# Patient Record
Sex: Female | Born: 1970
Health system: Southern US, Community
[De-identification: ages and names within clinical notes are randomized; demographics above are authoritative.]

## PROBLEM LIST (undated history)

## (undated) DIAGNOSIS — U071 COVID-19: Secondary | ICD-10-CM

## (undated) DIAGNOSIS — E559 Vitamin D deficiency, unspecified: Secondary | ICD-10-CM

## (undated) DIAGNOSIS — M1990 Unspecified osteoarthritis, unspecified site: Secondary | ICD-10-CM

## (undated) HISTORY — PX: ABDOMINAL HYSTERECTOMY: SHX81

## (undated) HISTORY — DX: Vitamin D deficiency, unspecified: E55.9

## (undated) HISTORY — DX: Unspecified osteoarthritis, unspecified site: M19.90

## (undated) HISTORY — DX: COVID-19: U07.1

## (undated) HISTORY — PX: SHOULDER SURGERY: SHX246

## (undated) HISTORY — PX: OTHER SURGICAL HISTORY: SHX169

---

## 1999-05-23 ENCOUNTER — Other Ambulatory Visit: Admission: RE | Admit: 1999-05-23 | Discharge: 1999-05-23 | Payer: Self-pay | Admitting: Obstetrics & Gynecology

## 2000-01-29 ENCOUNTER — Inpatient Hospital Stay (HOSPITAL_COMMUNITY): Admission: AD | Admit: 2000-01-29 | Discharge: 2000-01-29 | Payer: Self-pay | Admitting: Obstetrics and Gynecology

## 2000-01-29 ENCOUNTER — Inpatient Hospital Stay (HOSPITAL_COMMUNITY): Admission: AD | Admit: 2000-01-29 | Discharge: 2000-02-01 | Payer: Self-pay | Admitting: *Deleted

## 2003-09-14 ENCOUNTER — Encounter (INDEPENDENT_AMBULATORY_CARE_PROVIDER_SITE_OTHER): Payer: Self-pay

## 2003-09-14 ENCOUNTER — Ambulatory Visit (HOSPITAL_COMMUNITY): Admission: AD | Admit: 2003-09-14 | Discharge: 2003-09-14 | Payer: Self-pay | Admitting: Obstetrics and Gynecology

## 2003-09-22 ENCOUNTER — Inpatient Hospital Stay (HOSPITAL_COMMUNITY): Admission: AD | Admit: 2003-09-22 | Discharge: 2003-09-22 | Payer: Self-pay | Admitting: Obstetrics and Gynecology

## 2003-09-22 IMAGING — US US TRANSVAGINAL NON-OB
1 series · 18 of 25 positions shown · non-contrast
Comparison: none

CLINICAL DATA: D & C for blighted ovum on [DATE], [7K], who began passing clots approximately 3 days ago.  11 weeks gestational age at the time of D & C.
 TRANSVAGINAL PELVIC ULTRASOUND, [DATE]
 Transvaginal examination was performed revealing a combination of echogenic material and fluid within the endometrial canal.  There is a cystic structure which did not change within the endometrial canal which could certainly represent the gestational sac.  The canal measures maximally 10 mm in thickness.  No focal myometrial abnormalities are identified.
 Both ovaries are normal in size and appearance, the right measuring 4.1 x 1.8 x 1.9 cm and the left measuring 2.9 x 1.3 x 2.0 cm.  There are numerous small cysts in both ovaries, though no dominant corpus luteum cyst is identified.  Two of the small cysts in the right ovary are more complex in appearance.  No adnexal masses.  Moderate free fluid surrounding the uterus and right adnexa.
 IMPRESSION 
 Combination of echogenic blood clot and fluid within the endometrial canal. 
 Small bilateral ovarian cysts although no dominant cysts are identified.  Two of the small cysts in the right ovary are somewhat complex in appearance.
 No adnexal masses. Moderate free fluid surrounding the uterus and right adnexa.  Query retained gestational sac.

[Series 1: us pelvis complete · 18 of 38 slices shown]
[im 1/38]
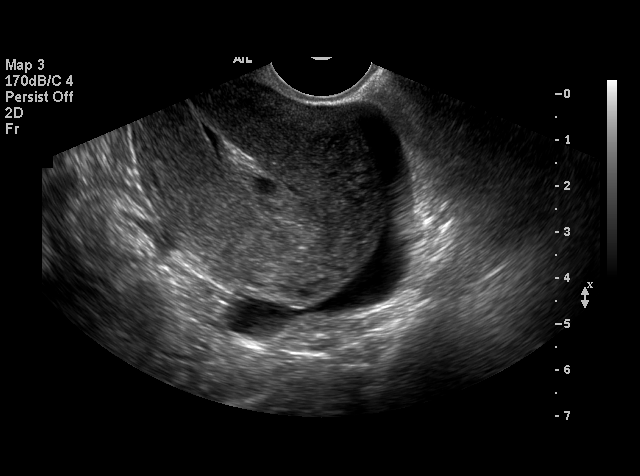
[im 4/38]
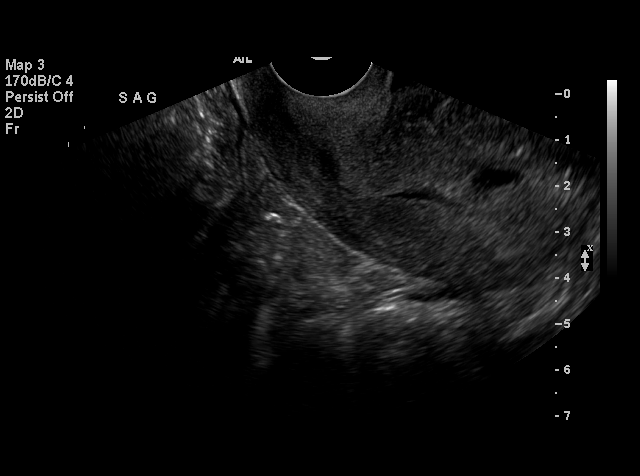
[im 5/38]
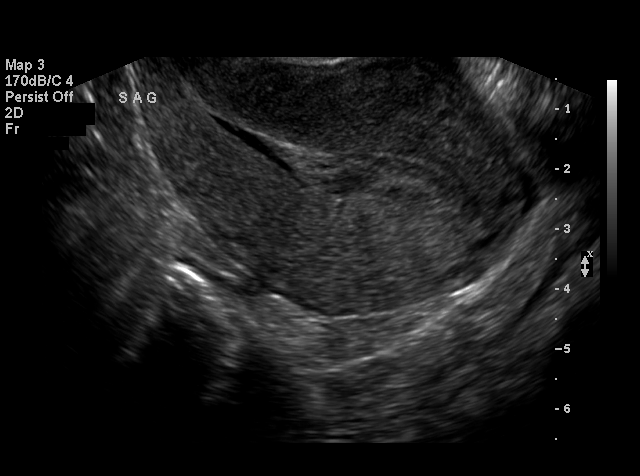
[im 7/38]
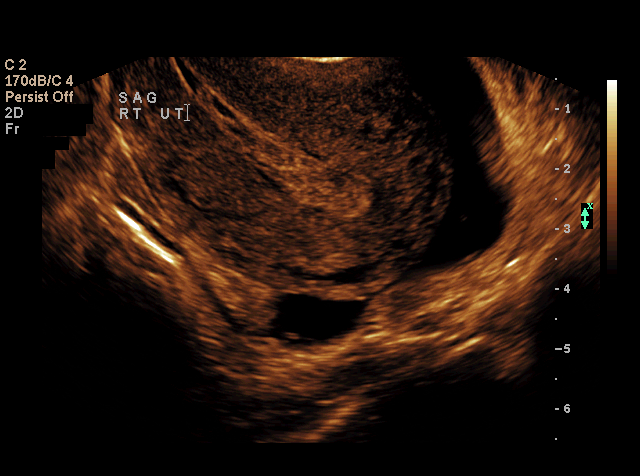
[im 10/38]
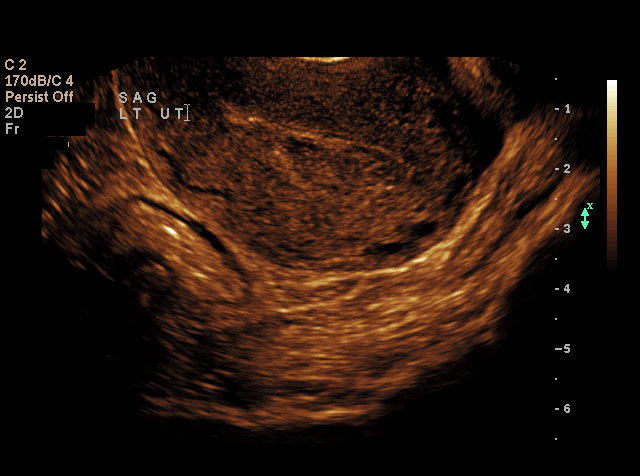
[im 11/38]
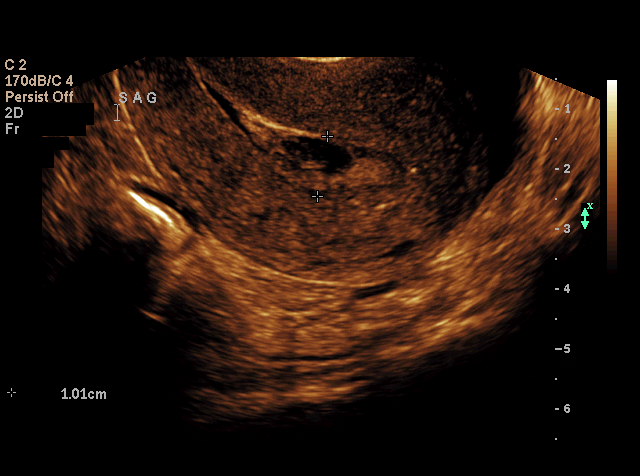
[im 14/38]
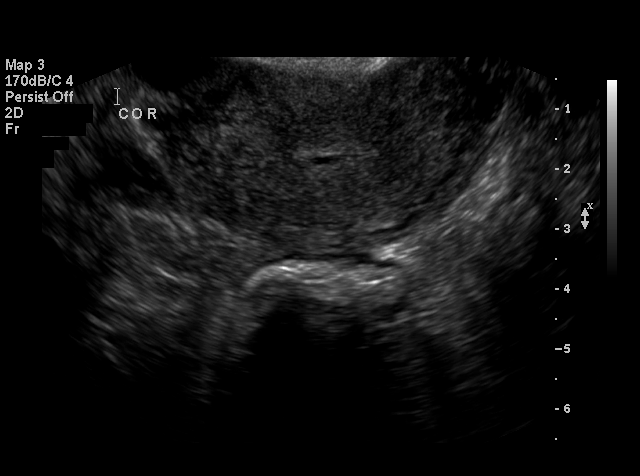
[im 16/38]
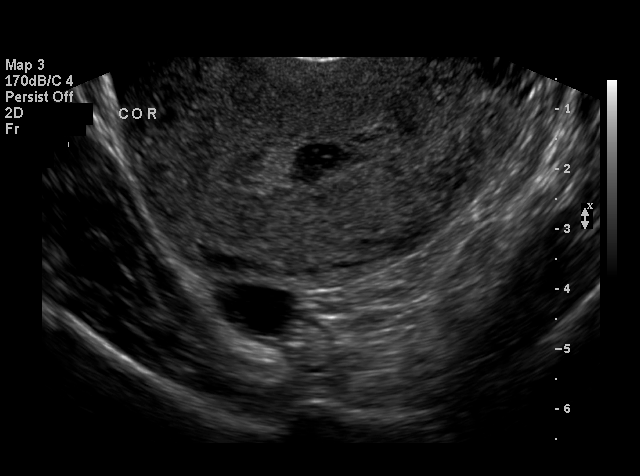
[im 17/38]
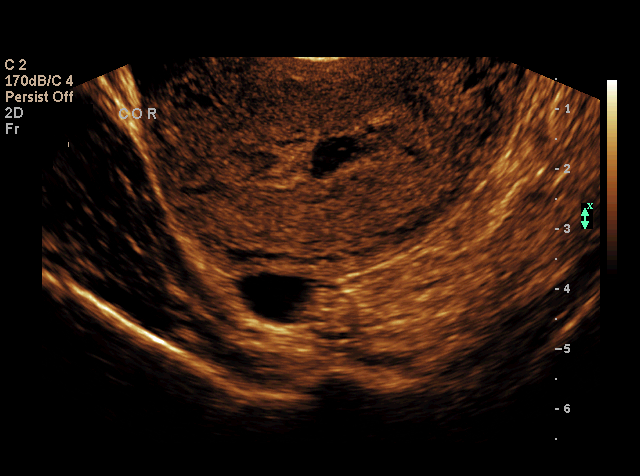
[im 21/38]
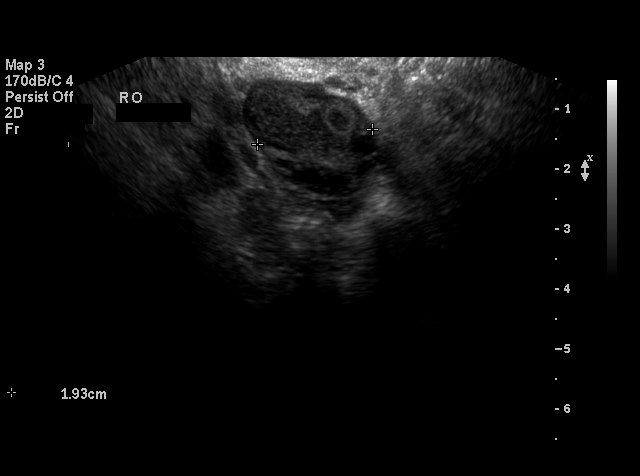
[im 22/38]
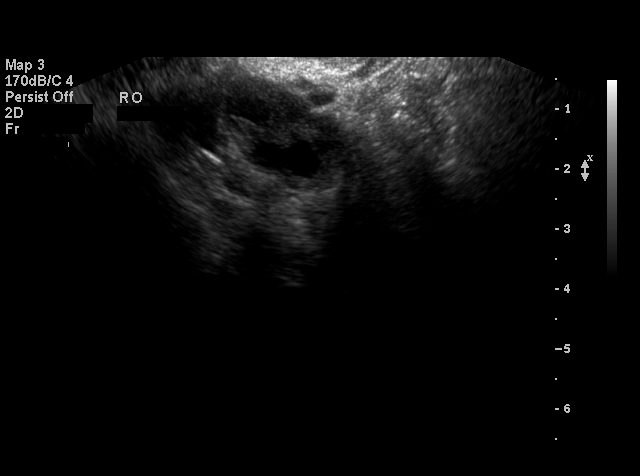
[im 24/38]
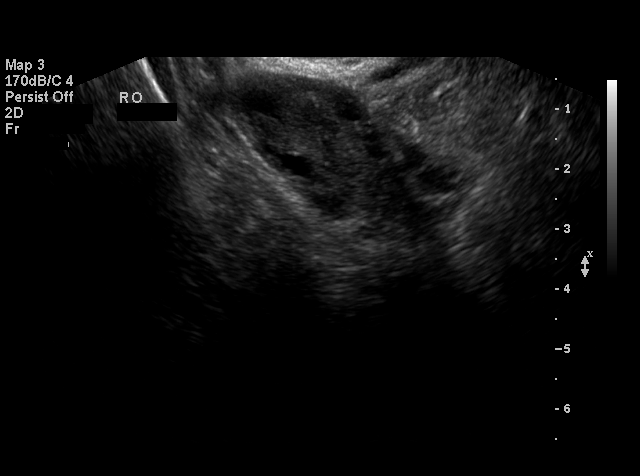
[im 27/38]
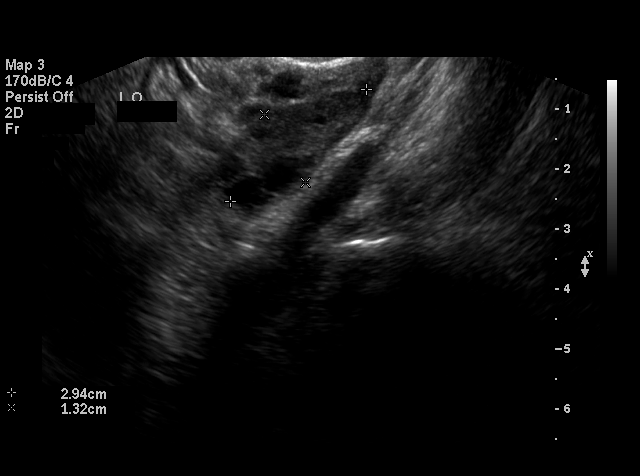
[im 28/38]
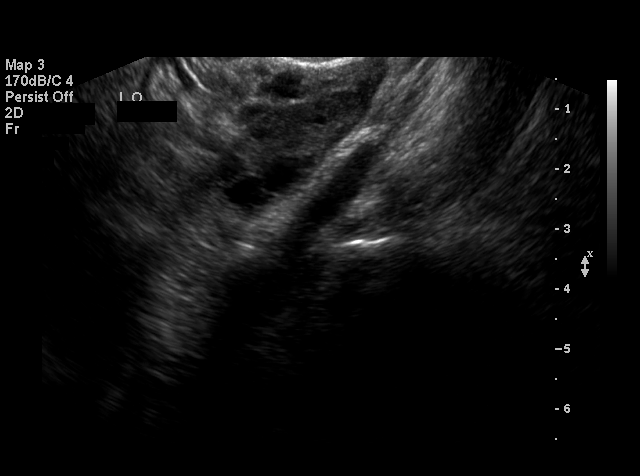
[im 31/38]
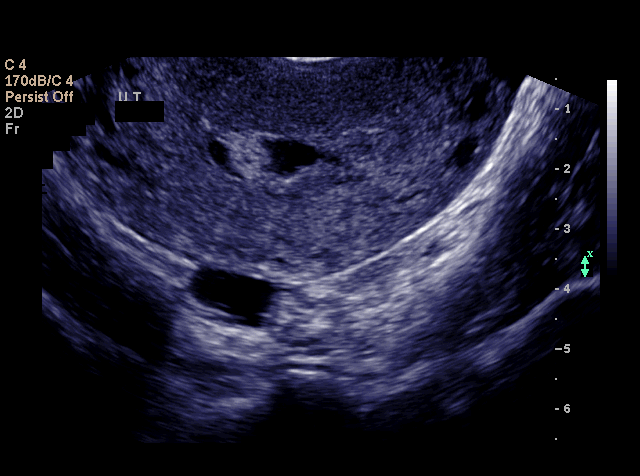
[im 33/38]
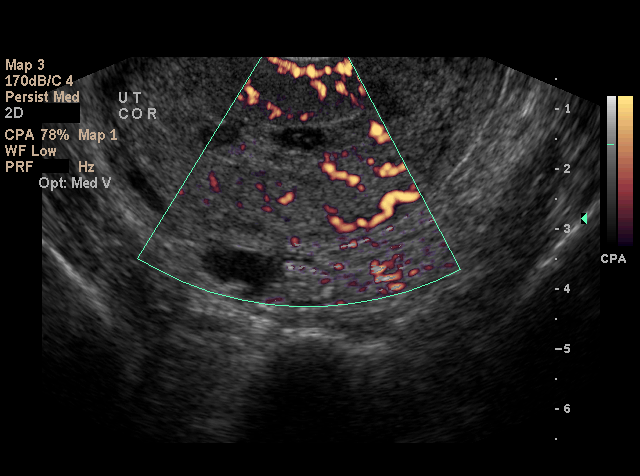
[im 34/38]
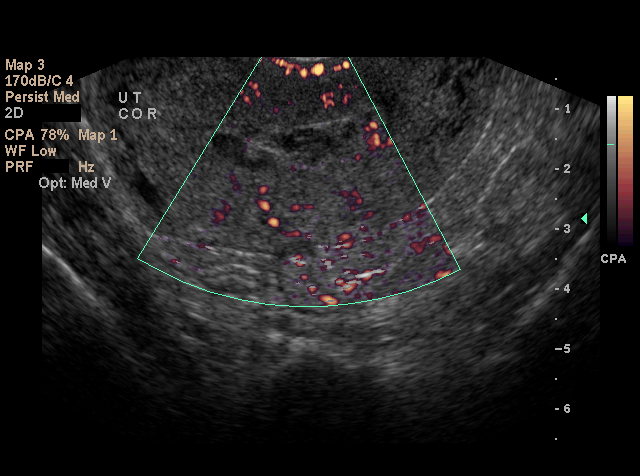
[im 38/38]
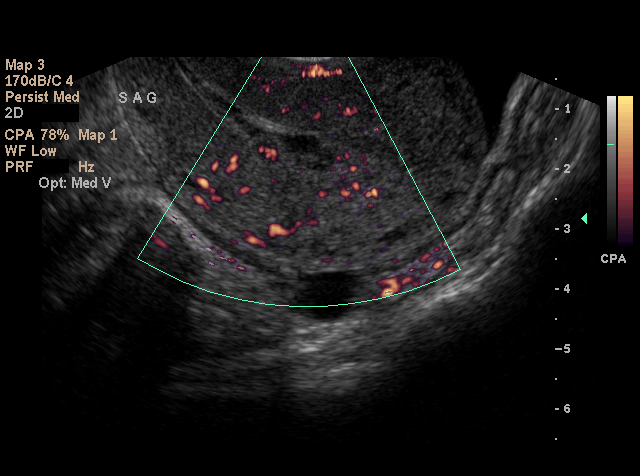

[18 of 25 positions shown; findings below may reference images not displayed]

## 2004-01-21 ENCOUNTER — Other Ambulatory Visit: Admission: RE | Admit: 2004-01-21 | Discharge: 2004-01-21 | Payer: Self-pay | Admitting: Obstetrics and Gynecology

## 2004-08-26 ENCOUNTER — Ambulatory Visit (HOSPITAL_COMMUNITY): Admission: RE | Admit: 2004-08-26 | Discharge: 2004-08-26 | Payer: Self-pay | Admitting: Obstetrics and Gynecology

## 2004-08-26 IMAGING — US US OB TRANSVAGINAL MODIFY
1 series · 14 of 28 positions shown · non-contrast
Comparison: none

CLINICAL DATA: Positive pregnancy test.   Gestational age of 5 weeks 0 days by LMP.  Right-sided pelvic pain.  Evaluate for ectopic pregnancy. 
 OBSTETRICAL ULTRASOUND WITH TRANSVAGINAL:
 A single tiny fluid collection is seen in the endometrial cavity which appears to have a double decidual sac sign.  The mean diameter is 3 mm which corresponds with a gestational age of 5 weeks 0 days, and this matches LMP.  No fibroids or other uterine abnormalities are seen.  
 A cyst with low-level internal echoes is seen in the right ovary measuring 1.8 cm.  This is consistent with a corpus luteum.  The left ovary is normal in appearance.  No adnexal masses or free fluid are identified.

[Series 1: us ob transvaginal modify · 0.27mm/px · 14 of 33 slices shown]
[im 2/33]
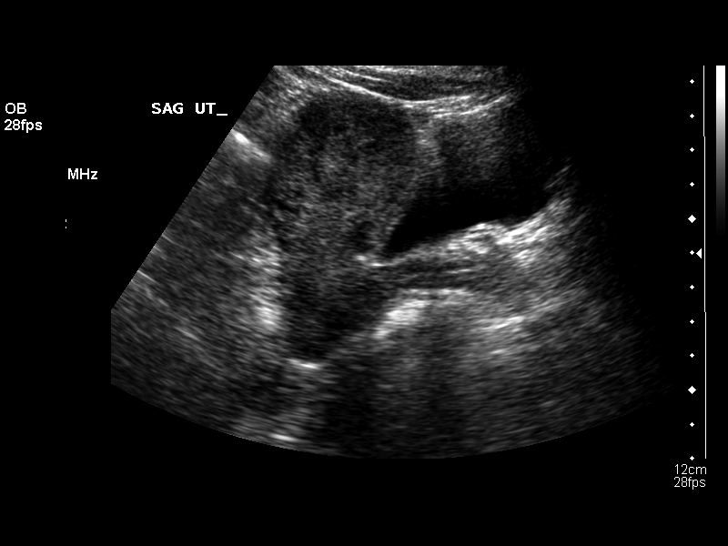
[im 4/33]
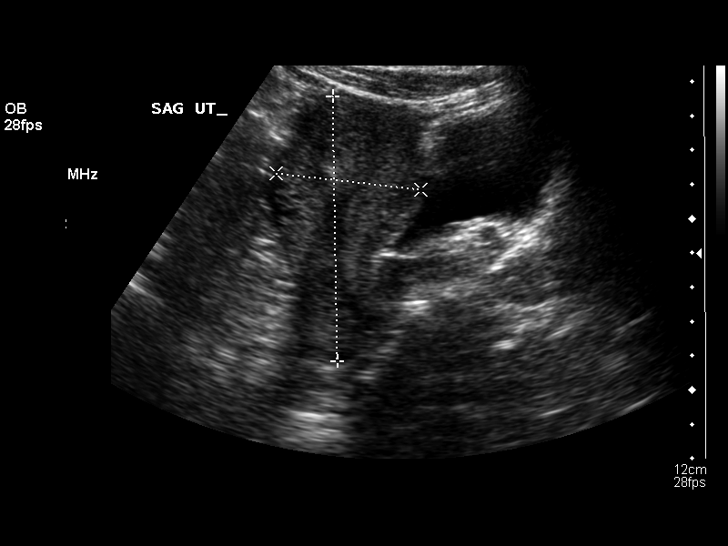
[im 6/33]
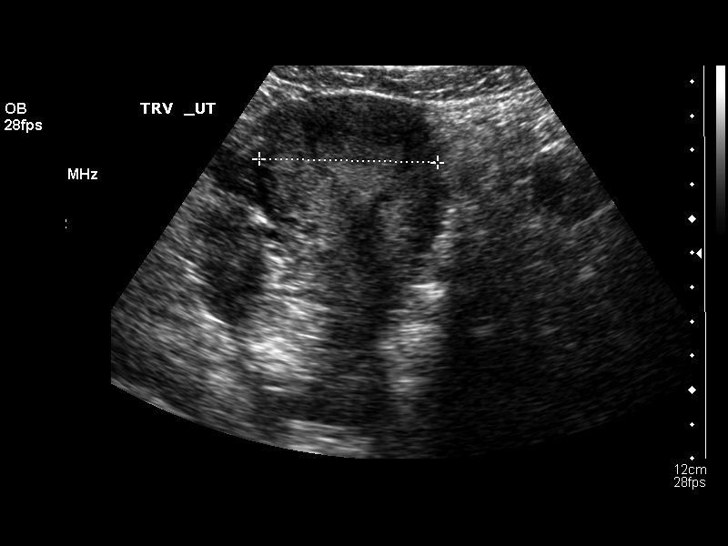
[im 9/33]
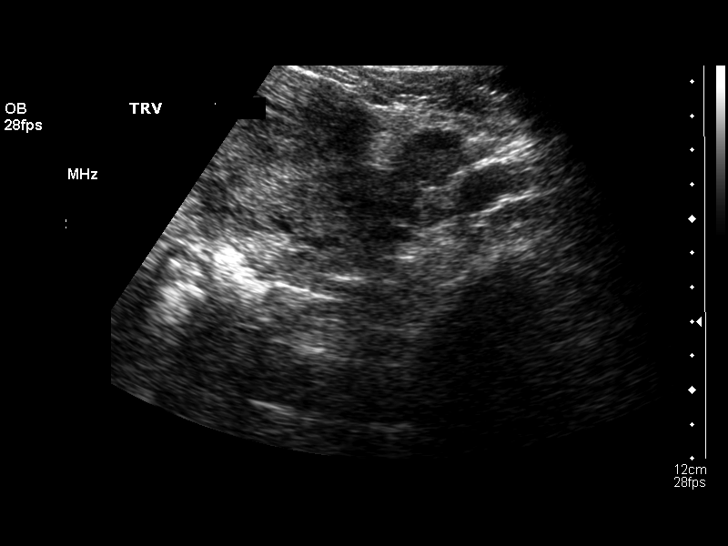
[im 11/33]
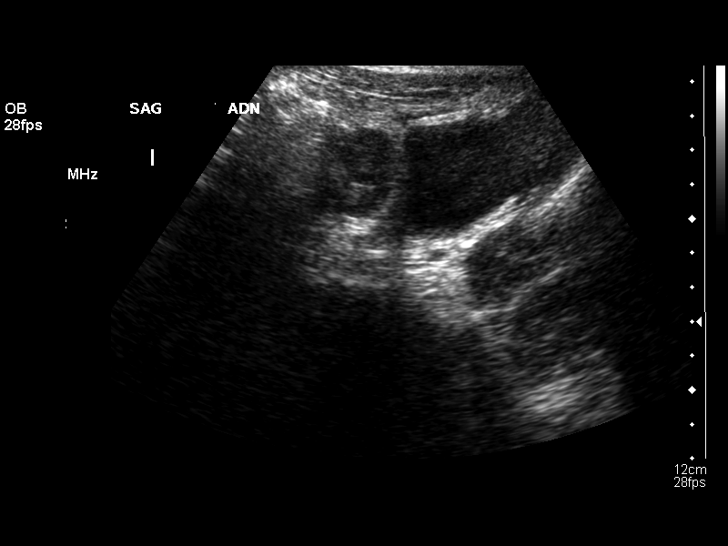
[im 14/33]
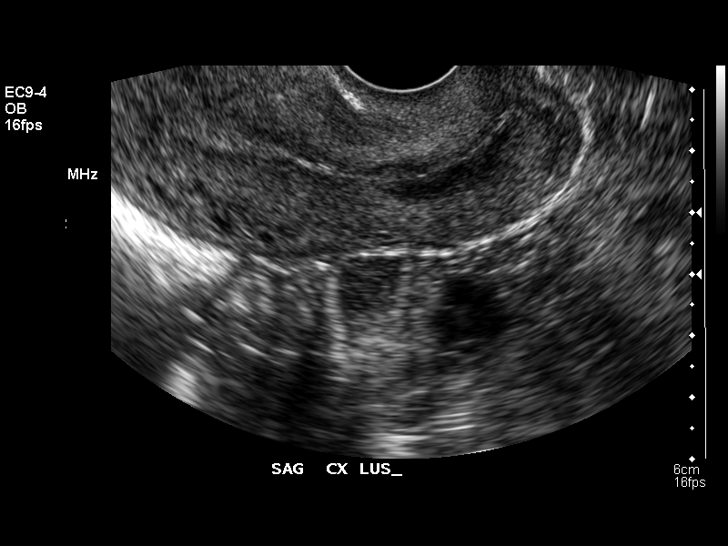
[im 16/33]
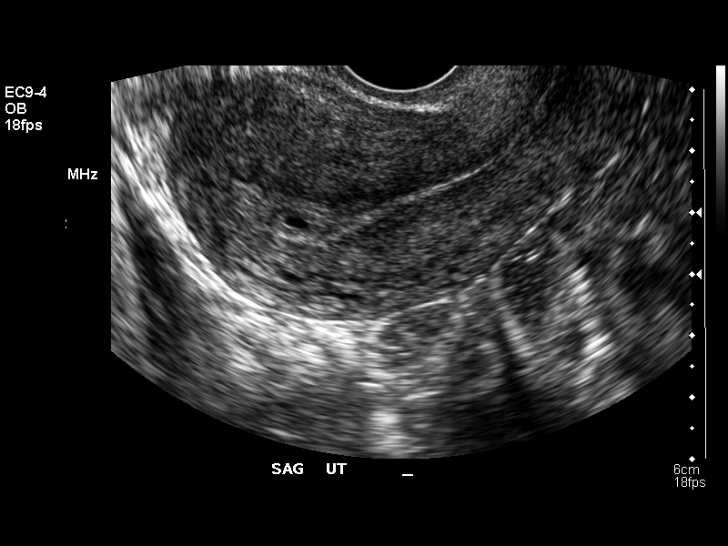
[im 18/33]
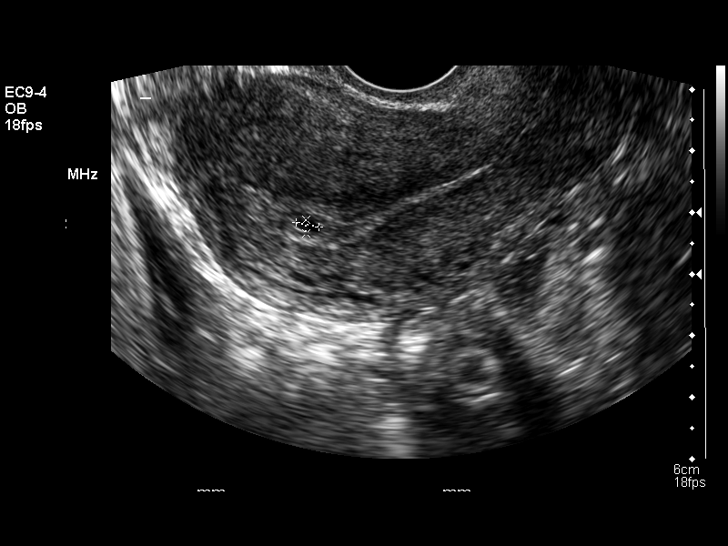
[im 21/33]
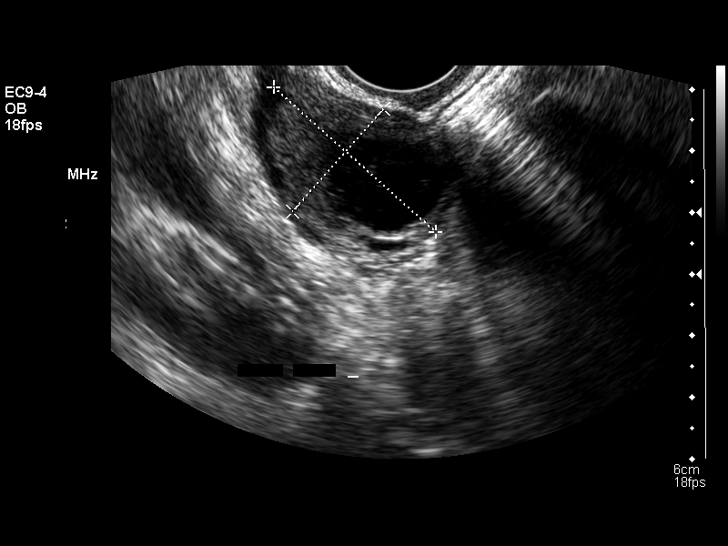
[im 23/33]
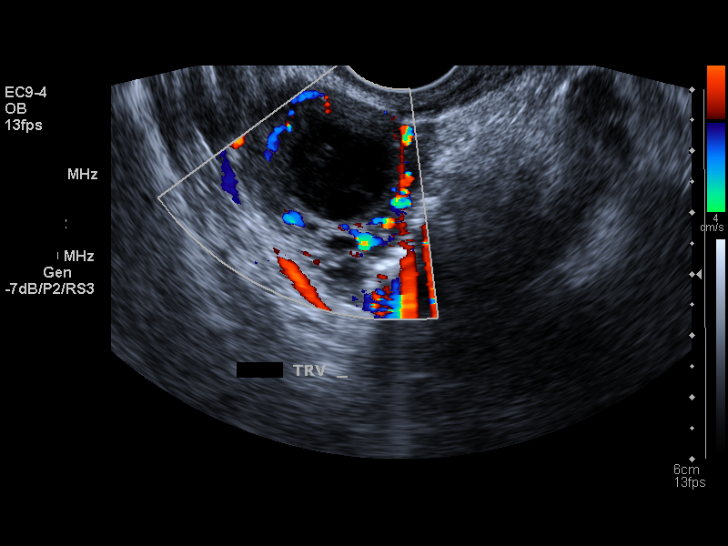
[im 25/33]
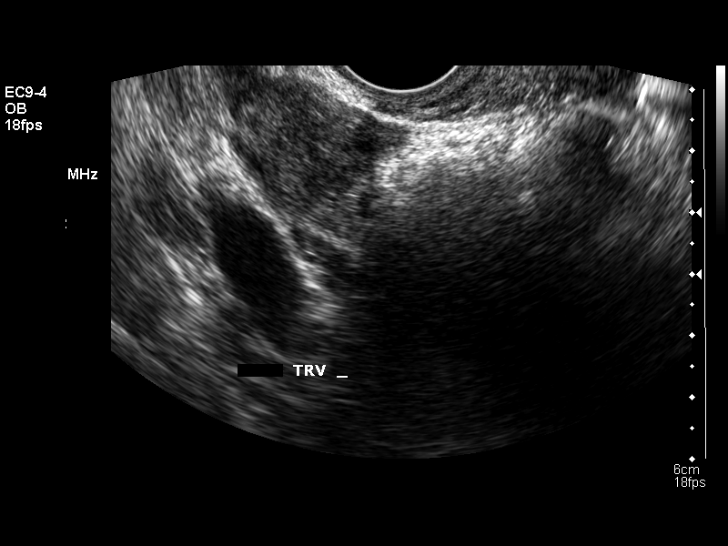
[im 28/33]
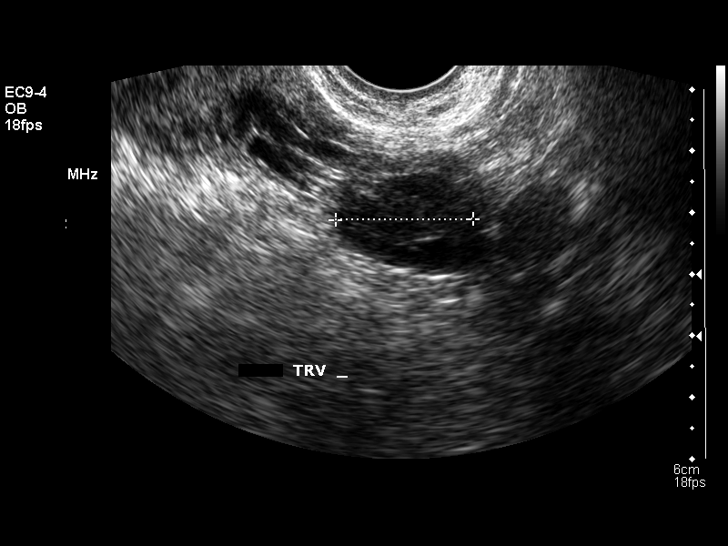
[im 30/33]
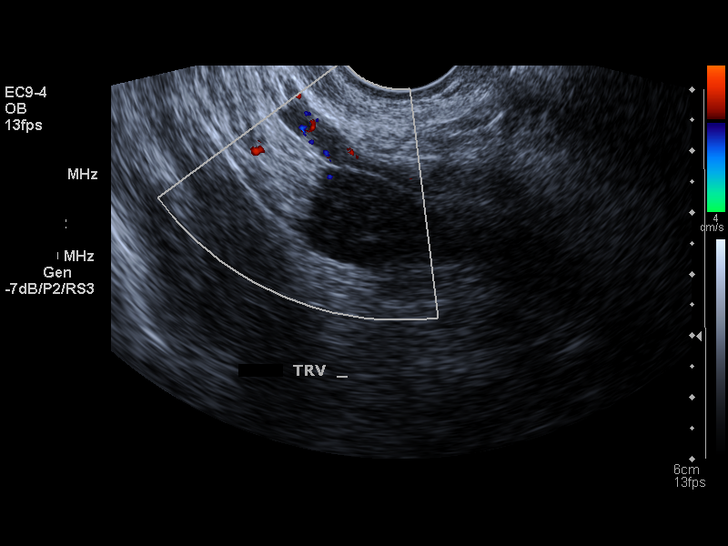
[im 33/33]
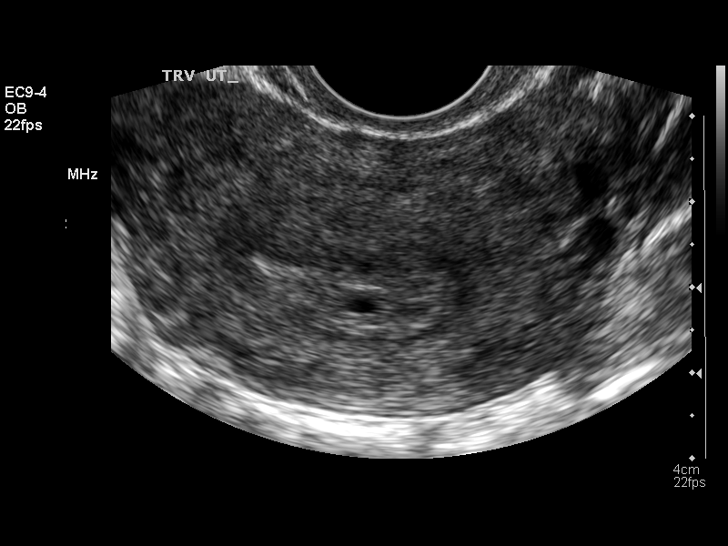

[14 of 28 positions shown; findings below may reference images not displayed]

IMPRESSION: 1.  Probable early single intrauterine gestational sac, with estimated gestational age of 5 weeks 0 days by mean sac diameter.  Follow-up of quantitative beta HCG levels or follow-up ultrasound is recommended to confirm pregnancy progression.
 2.  1.8 cm right ovarian corpus luteum cyst.  No evidence of adnexal masses or free fluid.

## 2004-09-02 ENCOUNTER — Ambulatory Visit (HOSPITAL_COMMUNITY): Admission: RE | Admit: 2004-09-02 | Discharge: 2004-09-02 | Payer: Self-pay | Admitting: Obstetrics and Gynecology

## 2004-09-02 IMAGING — US US OB TRANSVAGINAL
1 series · 14 of 28 positions shown · non-contrast
Comparison: none

CLINICAL DATA: Follow-up gestational sac seen on [DATE].  Estimated gestational age by LMP is 6 weeks 0 days.  
TRANSVAGINAL OBSTETRICAL ULTRASOUND:
An 11.8 mm  gestational sac  is seen which indicates a gestational age of 6 weeks 0 days.  There is a 1.6 mm fetal pole and normal appearing yolk sac today.  A heartbeat flicker is seen on realtime exam, but remains too small to measure an accurate heart rate.  Both ovaries have a normal size, shape and appearance.  There is no free pelvic fluid.

[Series 1: us ob transvaginal · 0.21mm/px · 44 acquisitions, 14 frames shown]
[im 2/44]
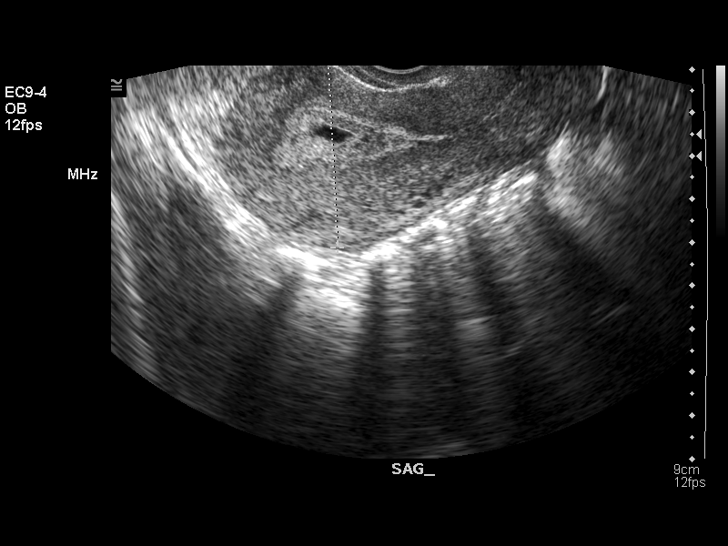
[im 5/44]
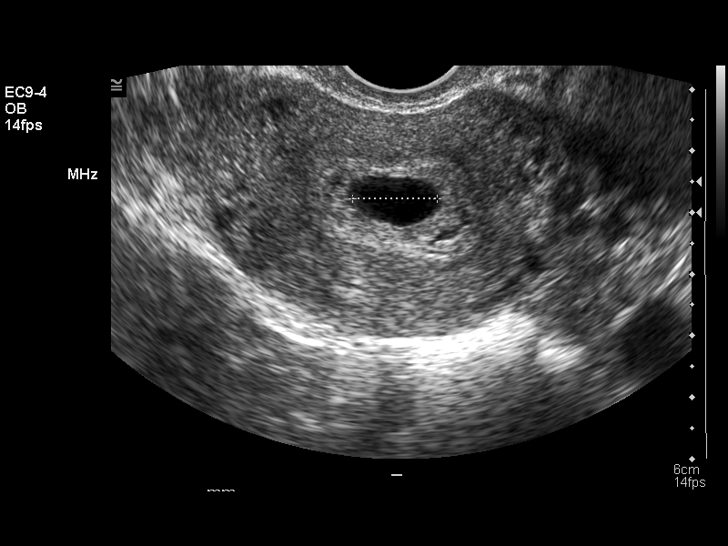
[im 8/44]
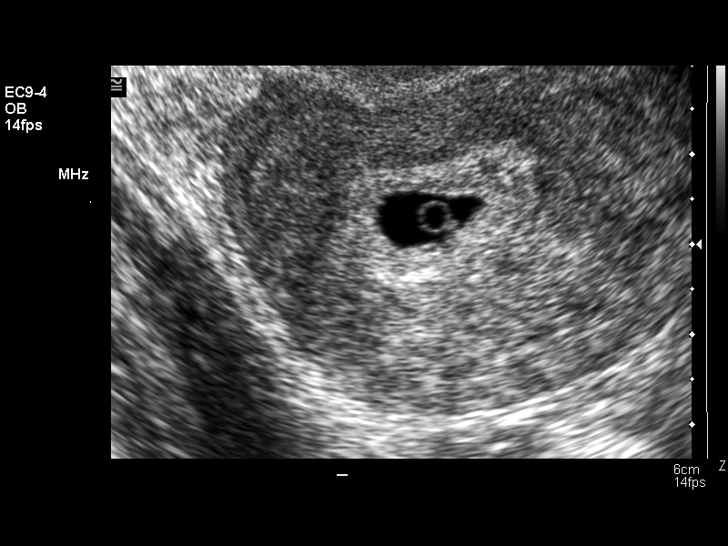
[im 12/44]
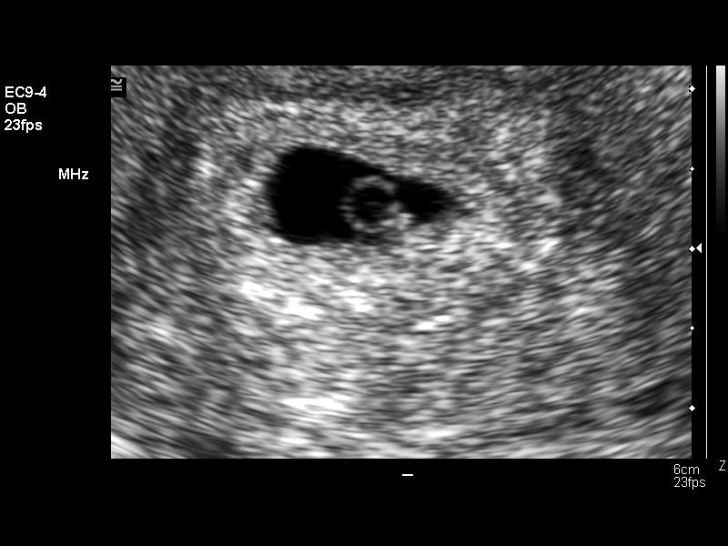
[im 15/44]
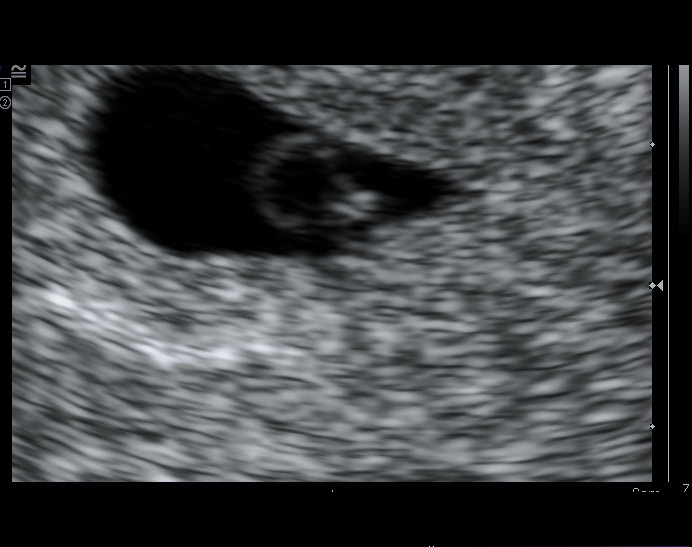
[im 18/44]
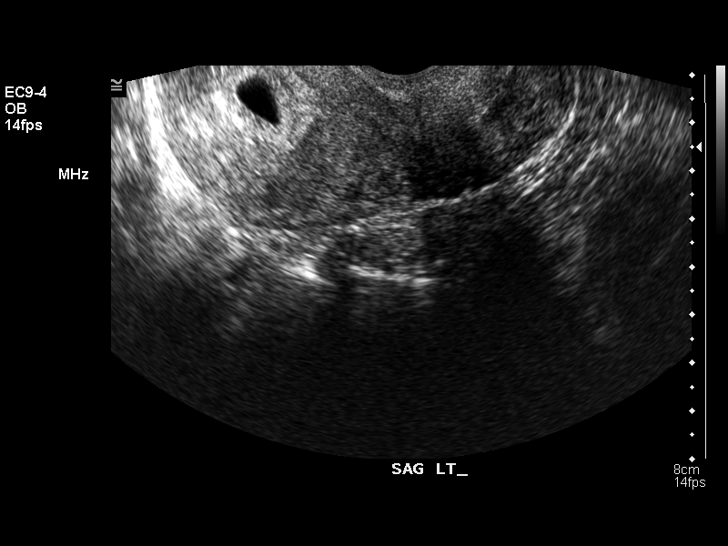
[im 21/44]
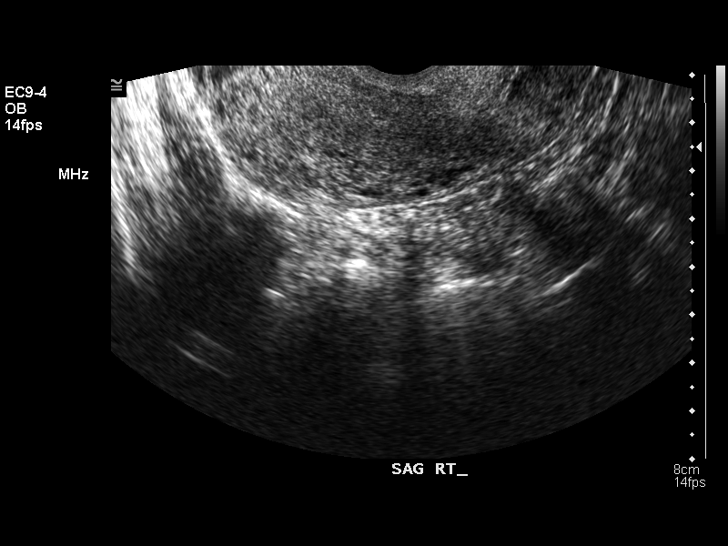
[im 24/44]
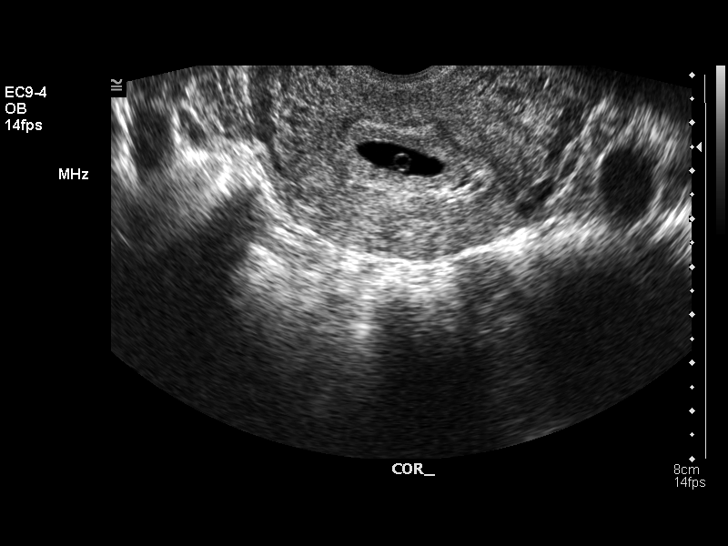
[im 28/44]
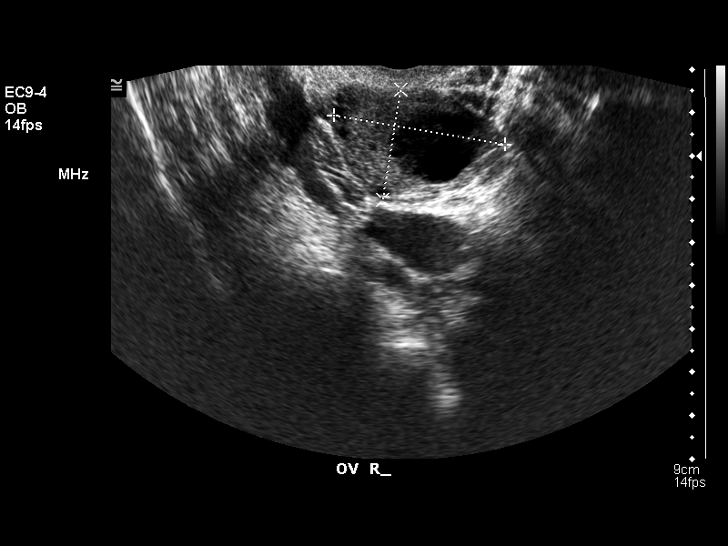
[im 31/44]
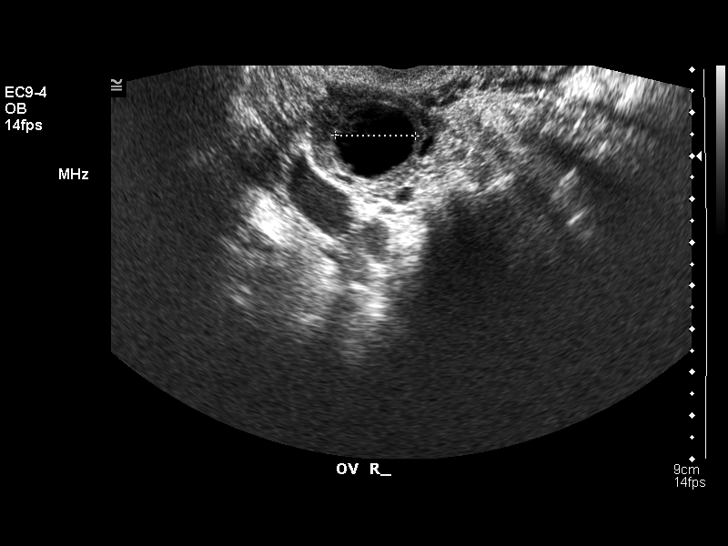
[im 34/44]
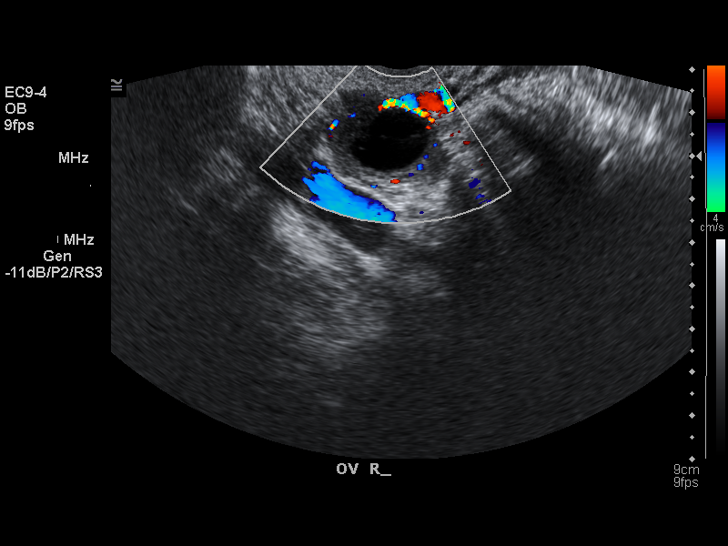
[im 37/44]
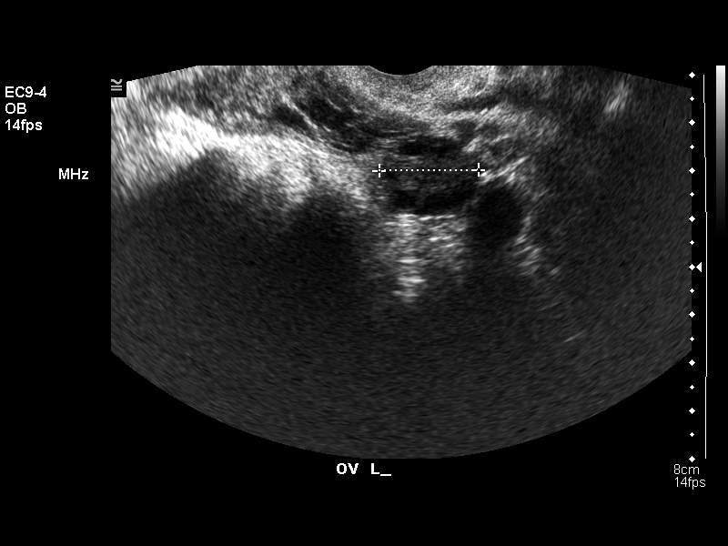
[im 40/44]
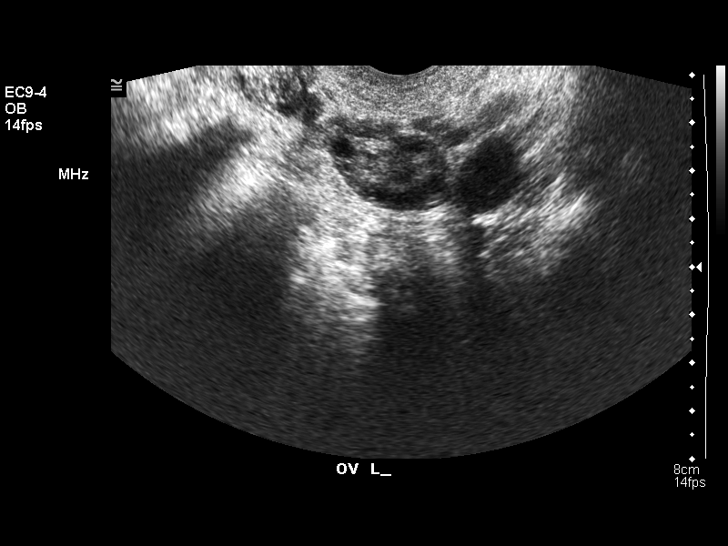
[im 44/44]
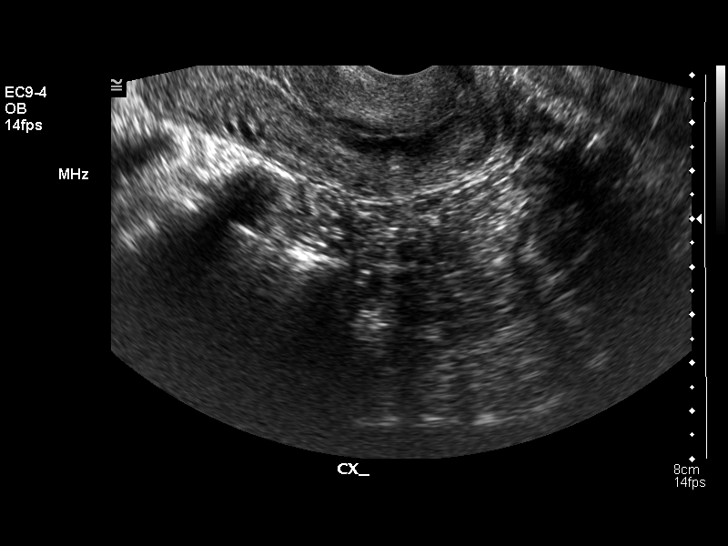

[14 of 28 positions shown; findings below may reference images not displayed]

IMPRESSION: Single intrauterine gestation with estimated gestational age of 6 weeks 0 days.

## 2004-12-01 ENCOUNTER — Ambulatory Visit (HOSPITAL_COMMUNITY): Admission: RE | Admit: 2004-12-01 | Discharge: 2004-12-01 | Payer: Self-pay | Admitting: Obstetrics and Gynecology

## 2004-12-01 IMAGING — US US OB DETAIL+14 WK
1 series · 13 of 28 positions shown · non-contrast
Comparison: none

CLINICAL DATA: Advanced maternal age.  Assess amniotic fluid and fetal anatomy.

[Series 1: us ob detail+14 wk · 0.33mm/px · 13 of 108 slices shown]
[im 4/108]
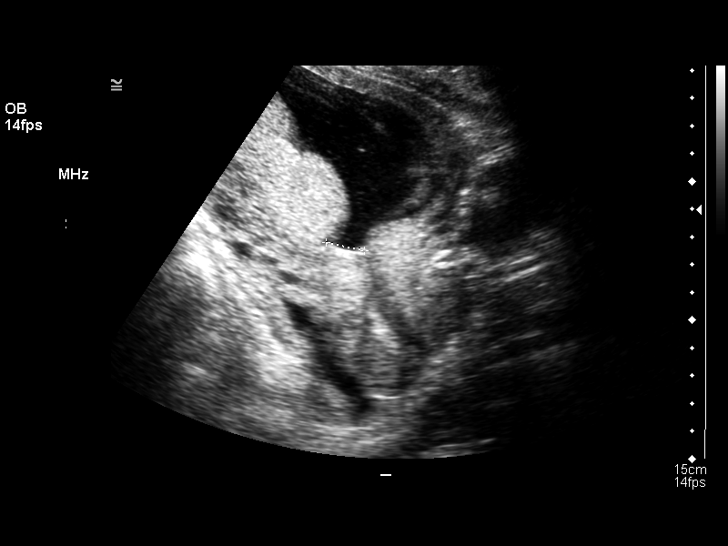
[im 12/108]
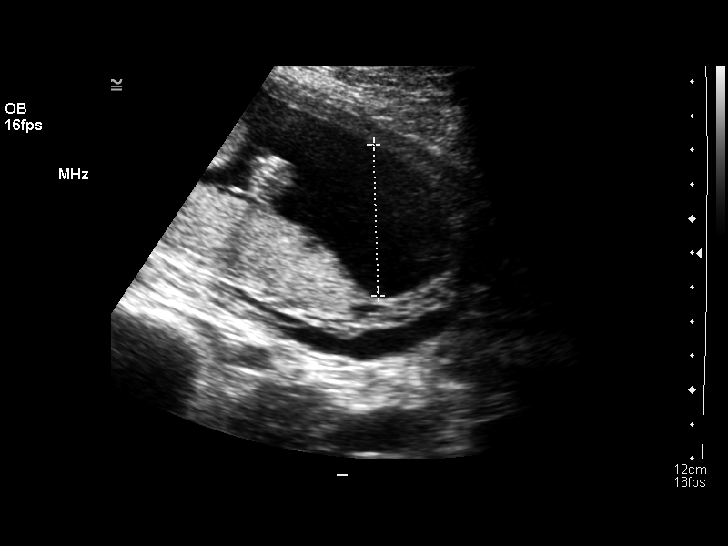
[im 20/108]
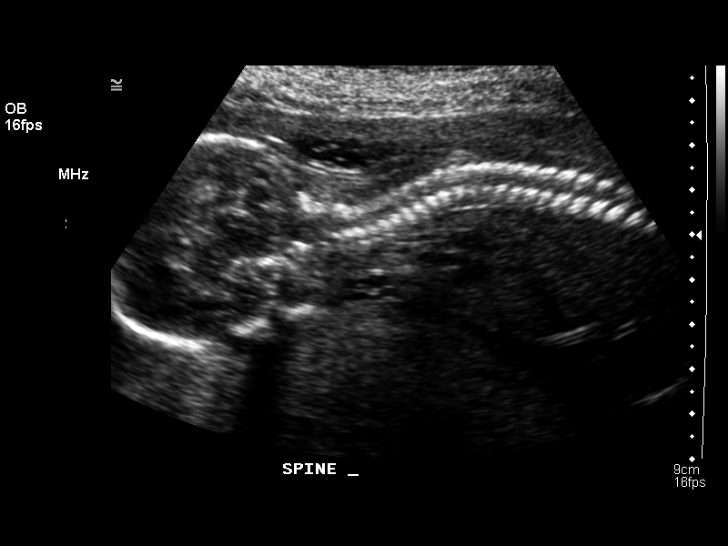
[im 28/108]
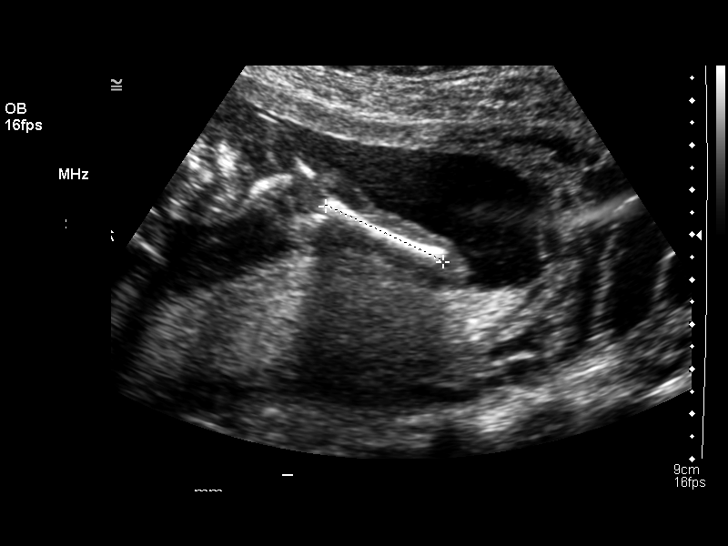
[im 36/108]
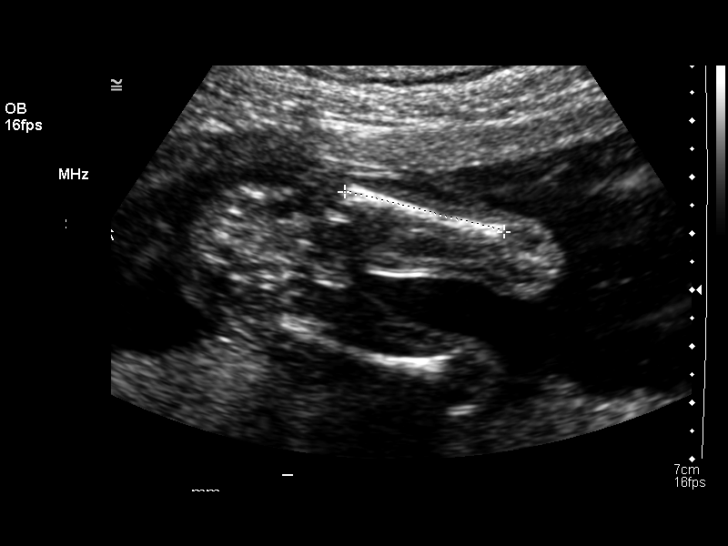
[im 44/108]
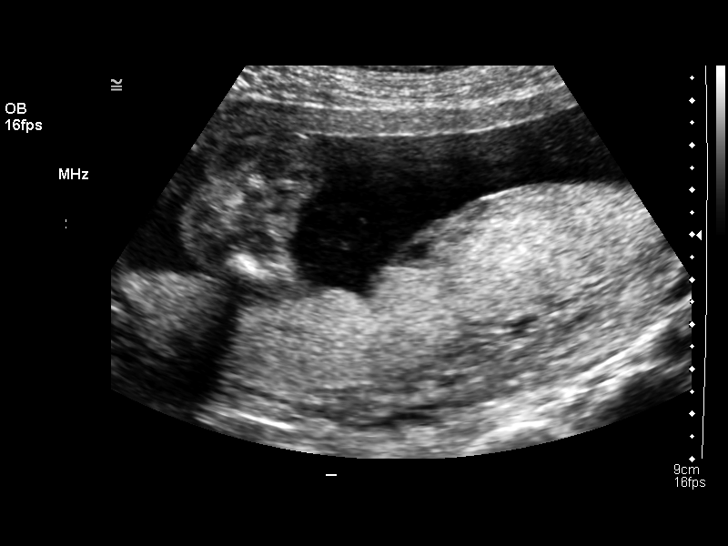
[im 56/108]
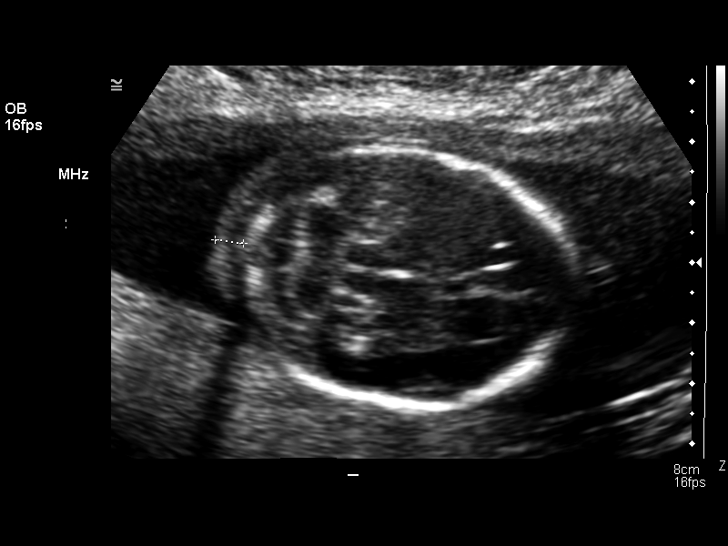
[im 64/108]
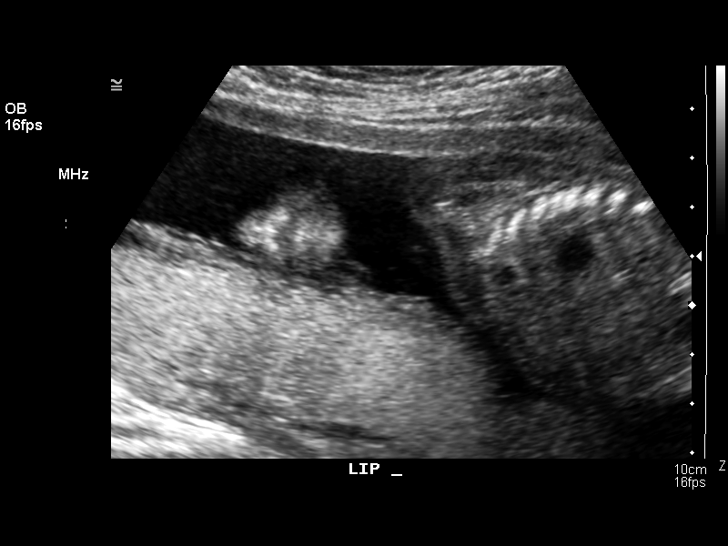
[im 72/108]
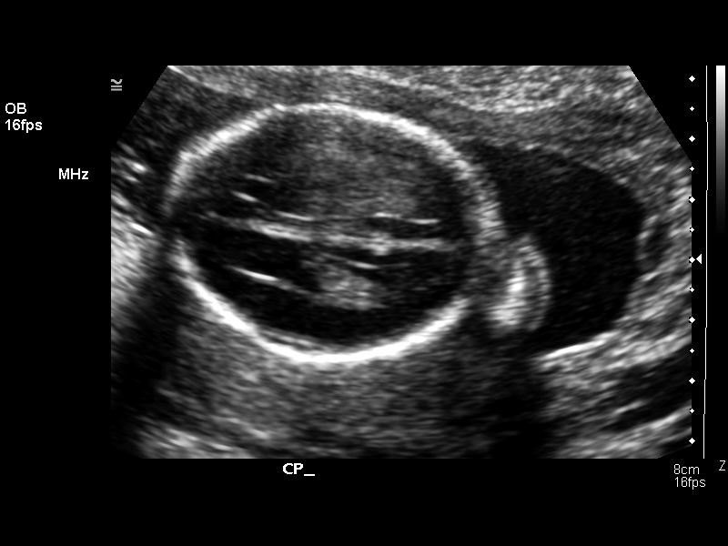
[im 80/108]
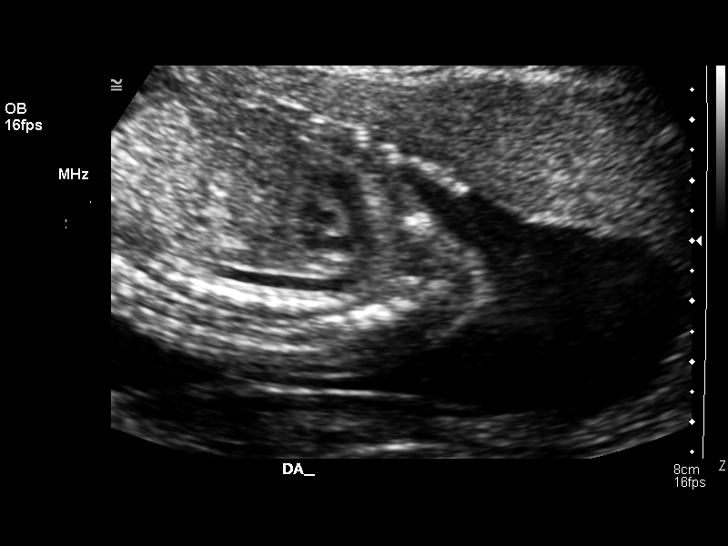
[im 88/108]
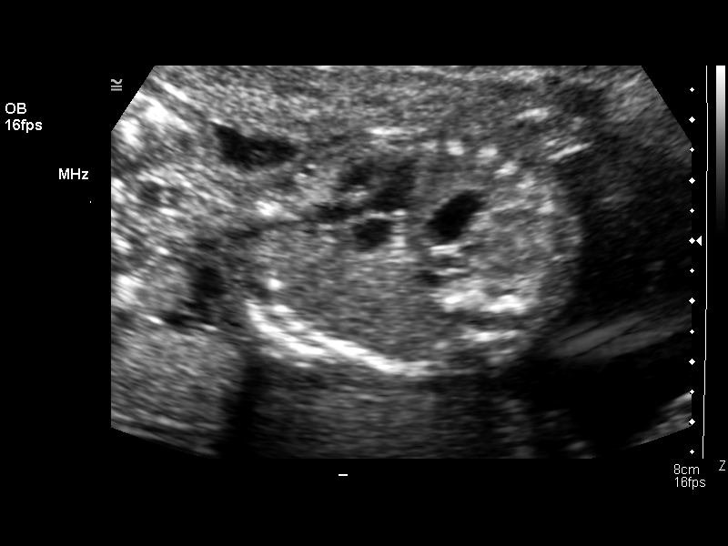
[im 96/108]
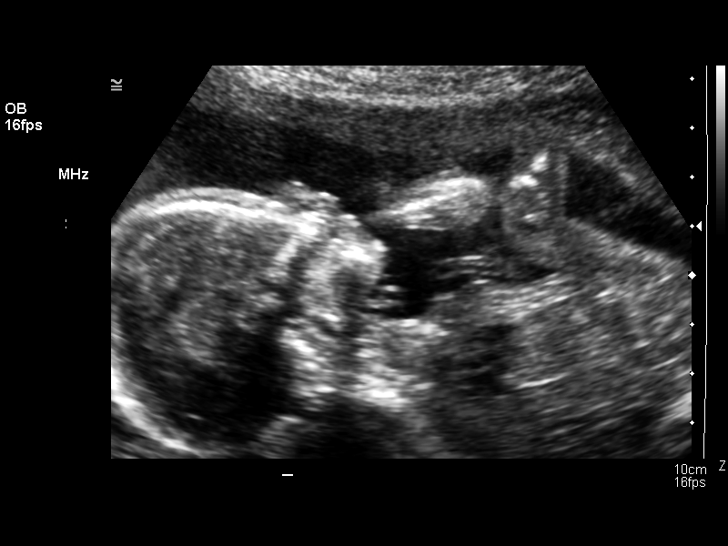
[im 104/108]
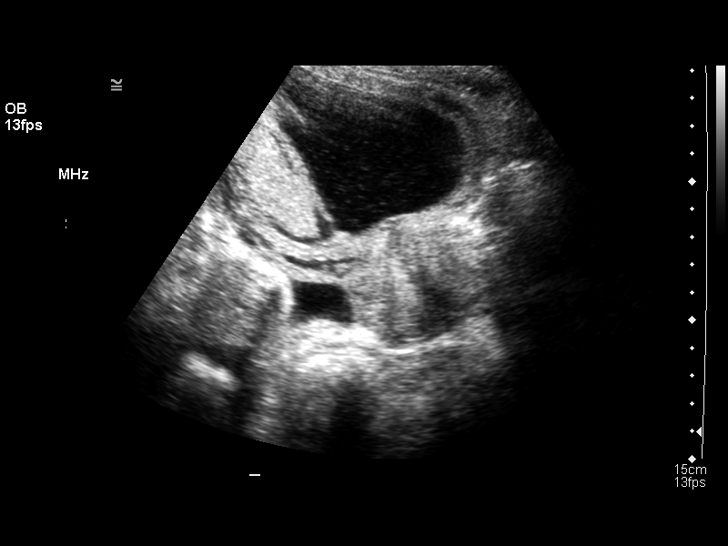

[13 of 28 positions shown; findings below may reference images not displayed]

DETAILED OBSTETRICAL ULTRASOUND:

Number of Fetuses:  1
Heart Rate:  153
Movement:  Yes
Breathing:  No
Presentation:  Breech
Placental Location:  Posterior, low-lying
Grade:  Previa:No    
Comment:  Currently the inferior margin of the placenta lies 1.7 cm from the internal cervical os compatible with a low-lying position.  
Amniotic Fluid (Subjective):  Normal
Amniotic Fluid (Objective):  4.4 cm Vertical pocket 

FETAL BIOMETRY
BPD:  4.1 cm  18 w 3 d
HC:  16.0 cm   18 w 6 d
AC:  14.4 cm  19 w 5 d
FL:  2.9 cm   19 w 0 d
HL:  2.9 cm   19 w 4 d

MEAN GA:  19 w 1 d
ASSIGNED GA:  18 w 6 d

FETAL ANATOMY
Lateral Ventricles:  Visualized 
Thalami/CSP:  Visualized 
Posterior Fossa:  Visualized 
Nuchal Region:  Visualized 
Spine:  Visualized 
4 Chamber Heart on Left:  Visualized 
Stomach on Left:  Visualized 
3 Vessel Cord:  Visualized 
Cord Insertion Site:  Visualized 
Kidneys:  Visualized 
Bladder:  Visualized 
Extremities:  Visualized 

ADDITIONAL ANATOMY VISUALIZED:  LVOT, RVOT, upper lip, orbits, profile, diaphragm, heel, 5th digit, ductal arch, aortic arch and female genitalia.
Comment:  Noted is the presence of an echogenic intracardiac focus within the left ventricle.  The nasal bone was visualized.

MATERNAL UTERINE AND ADNEXAL FINDINGS
Cervix:  4.3 cm Transabdominally
IMPRESSION: 1.  Single intrauterine pregnancy demonstrating an estimated gestational age by ultrasound of 19 weeks and 1 day.  Correlation with assigned gestational age by LMP and initial ultrasound of 18 weeks and 6 days suggests appropriate growth.
2.  Current low-lying placental position.  Follow-up at greater than 28 weeks is recommended for initial short term reassessment.  
3.  Noted is the presence of an echogenic intracardiac focus within the left cardiac ventricle.  No other focal fetal or placental abnormalities are seen.  Specifically, no other soft markers for Down syndrome are noted. As an isolated finding, the presence of an echogenic intracardiac focus would change the patient's preultrasound odds risk ratio at age 33 from [DATE] to [DATE].  The patient states she declined to have a triple screen performed.  
Today?s scan findings and their significance were discussed with the patient and her husband.  The patient was given a preliminary copy of this report to take to an office visit immediately following this exam.

</u12:p>

## 2004-12-30 ENCOUNTER — Ambulatory Visit (HOSPITAL_COMMUNITY): Admission: RE | Admit: 2004-12-30 | Discharge: 2004-12-30 | Payer: Self-pay | Admitting: Obstetrics and Gynecology

## 2004-12-30 IMAGING — US US OB FOLLOW-UP
1 series · 18 of 28 positions shown · non-contrast
Comparison: none

CLINICAL DATA: 23 week, 0 day assigned gestational age.   Follow-up placenta previa and fetal growth.   Echogenic intracardiac focus seen on prior ultrasound.  Increased risk for Down Syndrome.

[Series 1: us ob re-eval · 18 of 57 slices shown]
[im 1/57]
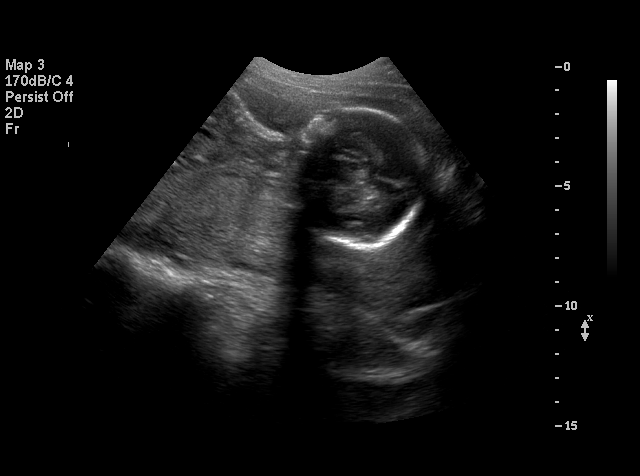
[im 5/57]
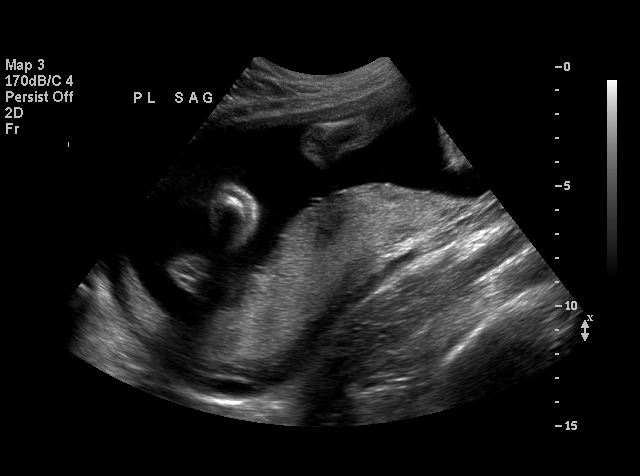
[im 7/57]
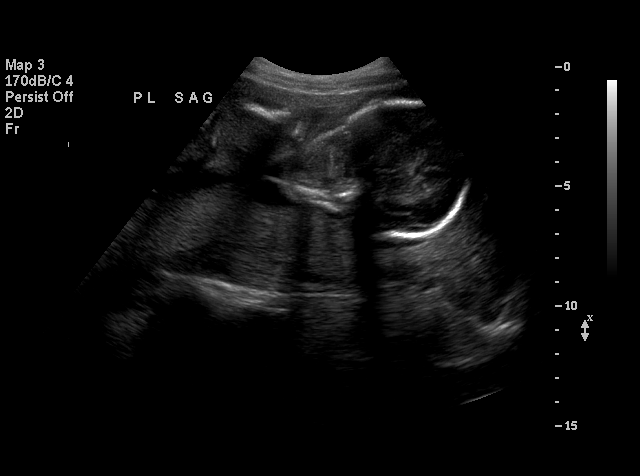
[im 11/57]
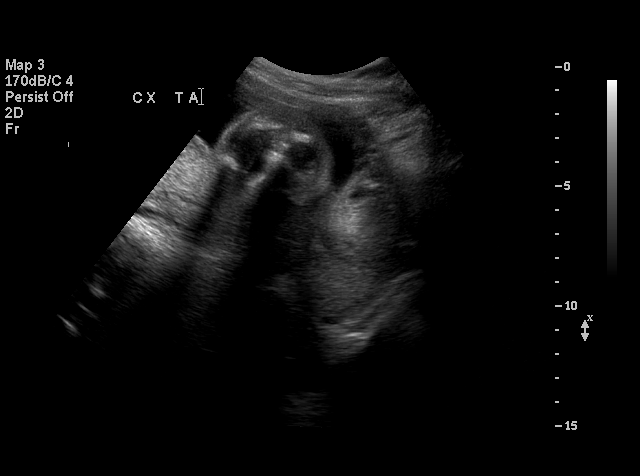
[im 15/57]
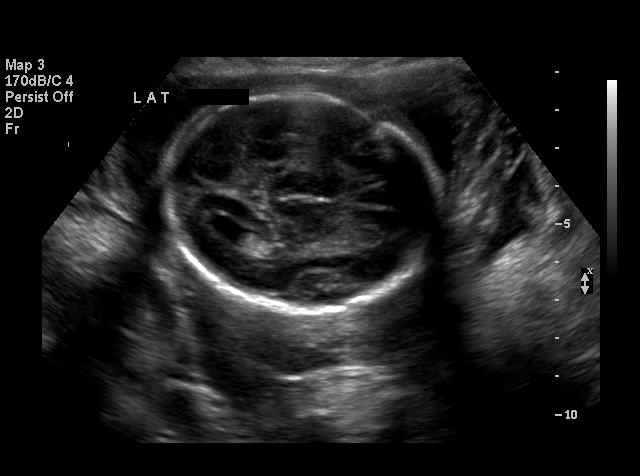
[im 17/57]
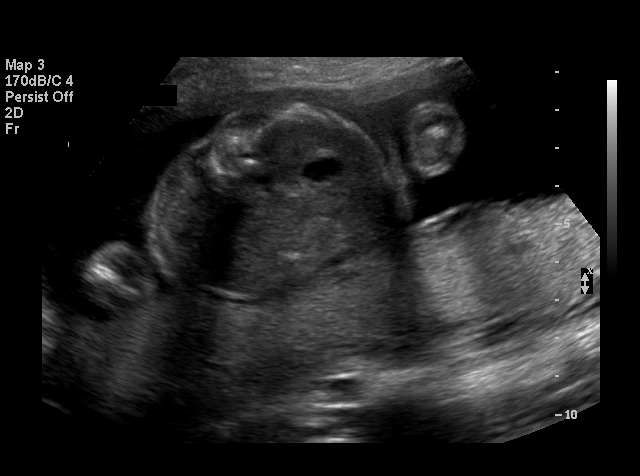
[im 21/57]
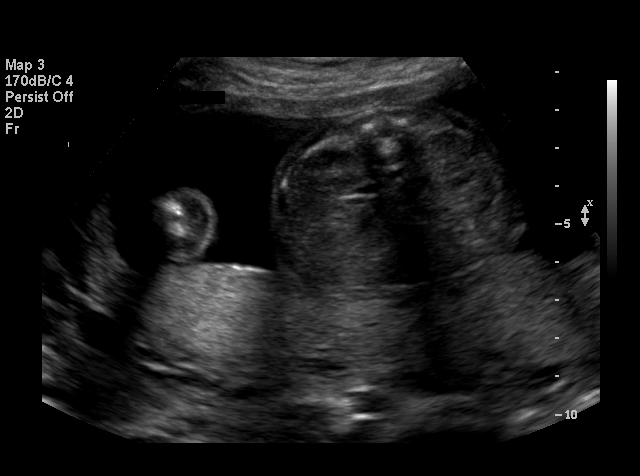
[im 23/57]
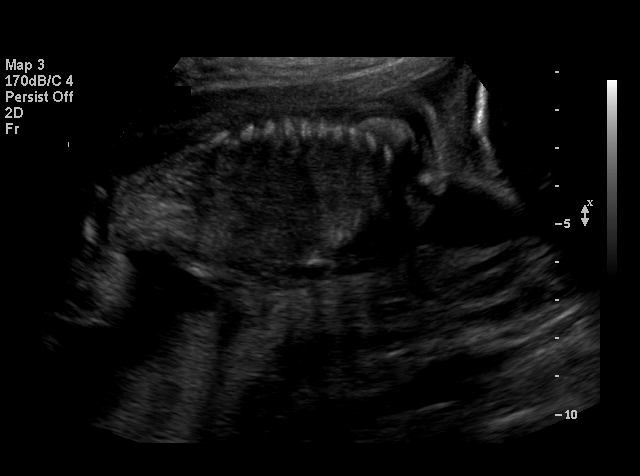
[im 27/57]
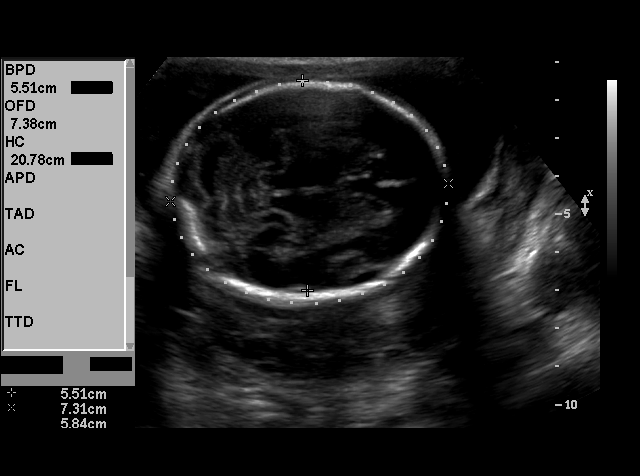
[im 30/57]
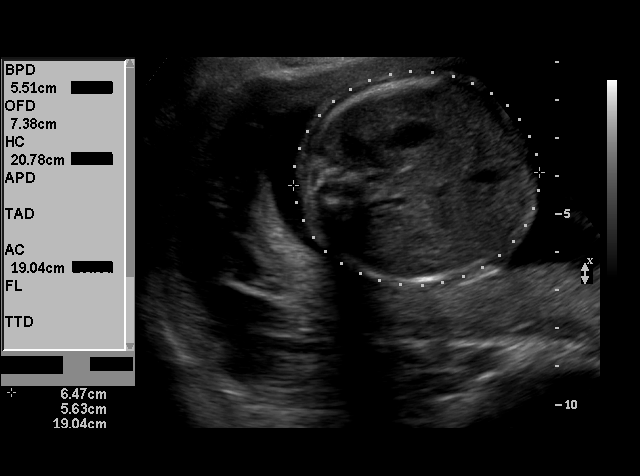
[im 34/57]
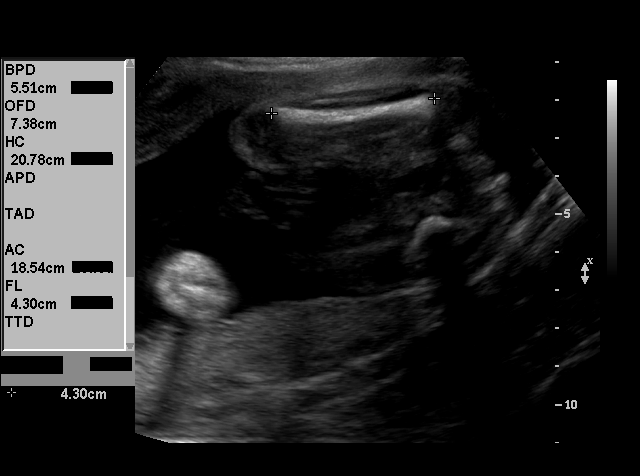
[im 36/57]
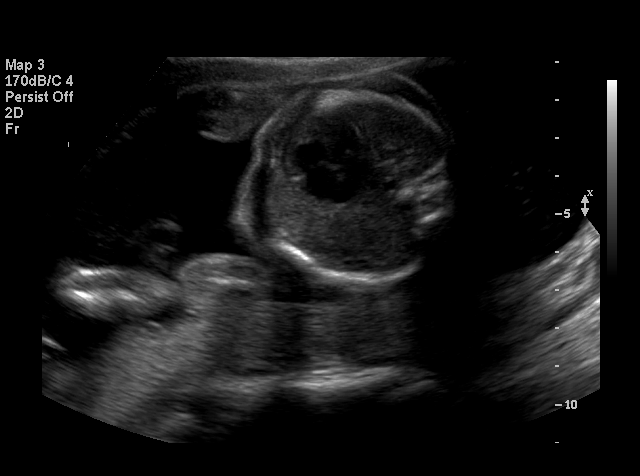
[im 40/57]
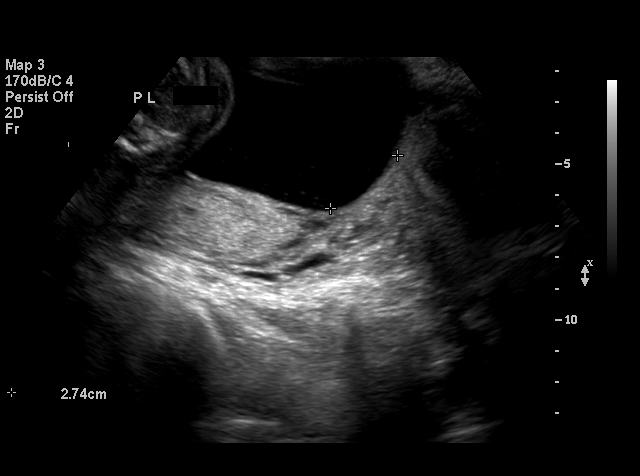
[im 44/57]
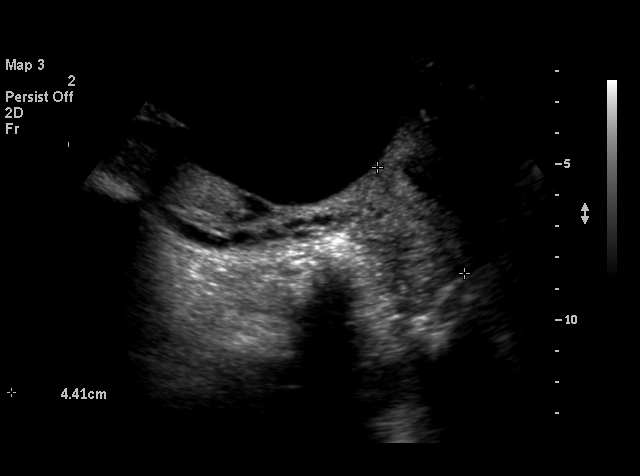
[im 46/57]
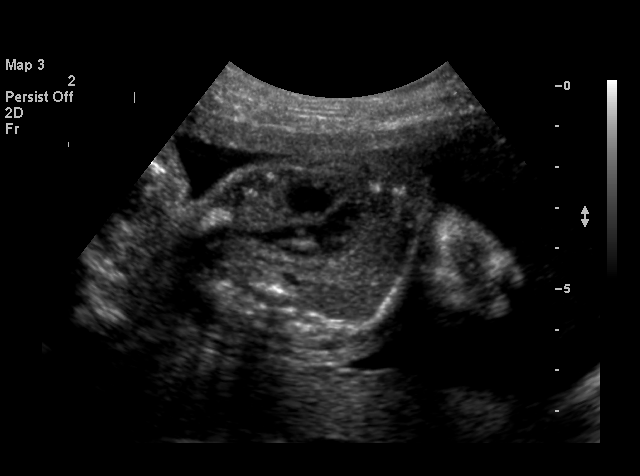
[im 50/57]
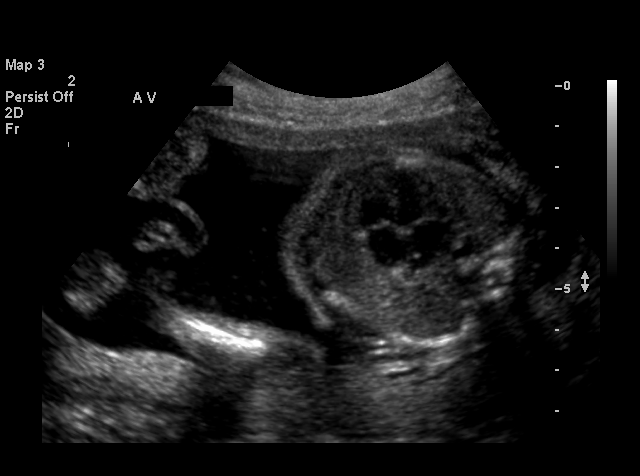
[im 52/57]
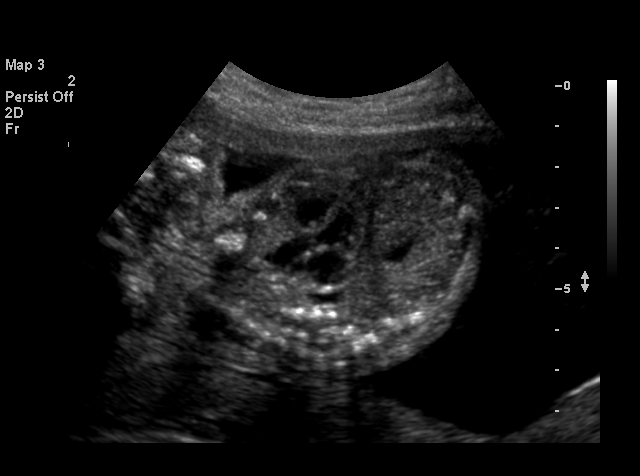
[im 57/57]
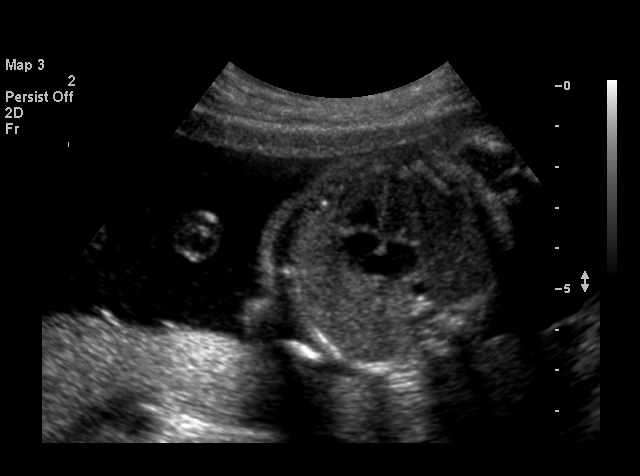

[18 of 28 positions shown; findings below may reference images not displayed]

OBSTETRICAL ULTRASOUND RE-EVALUATION:
Number of Fetuses:  1
Heart Rate:  140 bpm
Movement:  Yes
Breathing:  No
Presentation:  Cephalic
Placental Location:  Posterior 
Grade:  I
Previa:  No
Amniotic Fluid (subjective):  Normal
Amniotic Fluid (objective):  Vertical pocket 6.3 cm

FETAL BIOMETRY
BPD:  5.5 cm  22 w  6 d
HC:  20.8 cm  23 w  0 d
AC:  18.9 cm  23 w  5 d
FL:  4.3 cm  24 w   1 d

MEAN GA:  23 w  3 d

FETAL ANATOMY
Lateral Ventricles:  Visualized 
Thalami/CSP:  Not Visualized 
Posterior Fossa:  Not Visualized 
Nuchal Region:  Not Visualized 
Spine:  Not Visualized 
4 Chamber Heart on Left:  Visualized 
Stomach on Left:  Visualized 
3 Vessel Cord:  Not Visualized 
Cord Insertion Site:  Not Visualized 
Kidneys:  Visualized 
Bladder:  Visualized 
Extremities:  Not Visualized 

MATERNAL UTERINE AND ADNEXAL FINDINGS
Cervix:  4.4 cm transabdominally.
IMPRESSION: 1.  Assigned gestational age is currently 23 week, 0 days.  Appropriate fetal growth. 

2.  Resolution of placenta previa.  No fetal abnormality identified.   Normal amniotic fluid volume.

## 2005-04-27 ENCOUNTER — Inpatient Hospital Stay (HOSPITAL_COMMUNITY): Admission: AD | Admit: 2005-04-27 | Discharge: 2005-04-29 | Payer: Self-pay | Admitting: Obstetrics and Gynecology

## 2005-12-08 ENCOUNTER — Other Ambulatory Visit: Admission: RE | Admit: 2005-12-08 | Discharge: 2005-12-08 | Payer: Self-pay | Admitting: Obstetrics and Gynecology

## 2006-02-23 ENCOUNTER — Encounter: Admission: RE | Admit: 2006-02-23 | Discharge: 2006-02-23 | Payer: Self-pay | Admitting: Obstetrics and Gynecology

## 2007-06-06 ENCOUNTER — Ambulatory Visit (HOSPITAL_COMMUNITY): Admission: RE | Admit: 2007-06-06 | Discharge: 2007-06-06 | Payer: Self-pay | Admitting: Obstetrics and Gynecology

## 2007-06-06 IMAGING — US US SONOHYSTEROGRAM - WITH RAD
1 series · 13 of 25 positions shown · non-contrast
Comparison: none

CLINICAL DATA: Irregular bleeding.  Evaluate polyps.  LMP [DATE].
 TRANSABDOMINAL AND TRANSVAGINAL PELVIC ULTRASOUND:
TECHNIQUE: Both transabdominal and transvaginal ultrasound examinations of the pelvis were performed including evaluation of the uterus, ovaries, adnexal regions, and pelvic cul-de-sac.
TECHNIQUE: Following cleansing of the cervix and vagina with Betadine, a hysterosalpingogram catheter was placed within the endocervical canal.  Sonohysterogram was then performed with transvaginal sonography during infusion of sterile saline solution into the endometrial cavity.  Patient tolerated the examination without difficulty.  Approximately 7 cc of sterile saline was injected into the endometrial canal without difficulty.

[Series 1: us sonohysterogram - with rad · 0.18mm/px · 56 acquisitions, 13 frames shown]
[im 1/56]
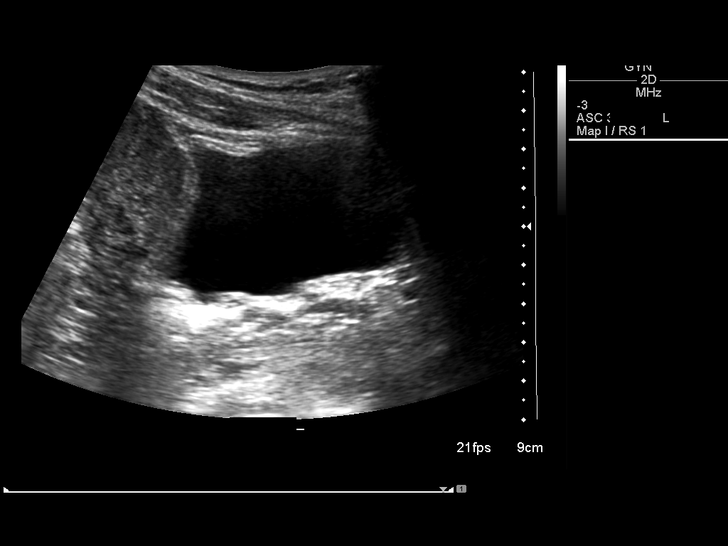
[im 5/56]
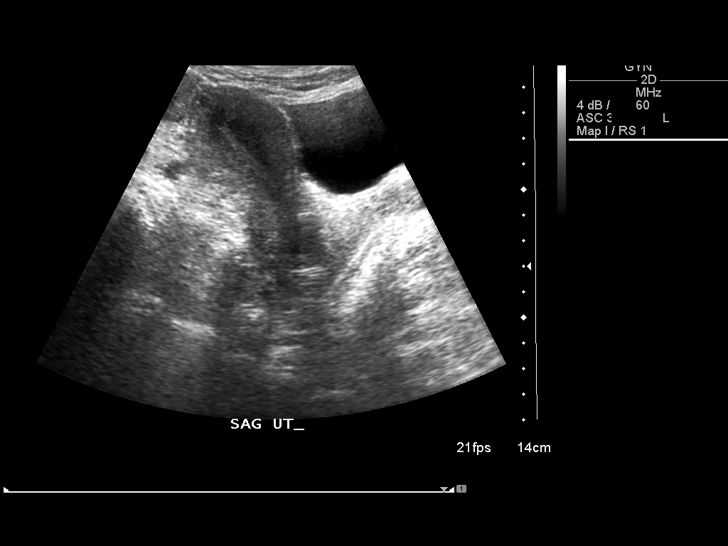
[im 10/56]
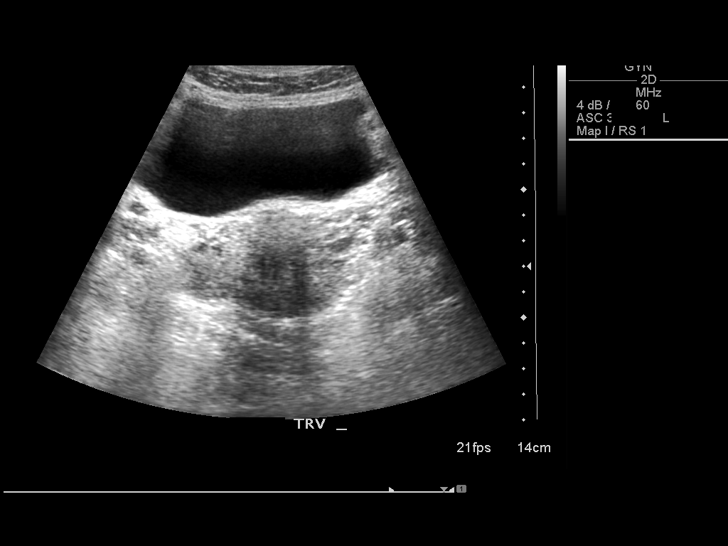
[im 14/56]
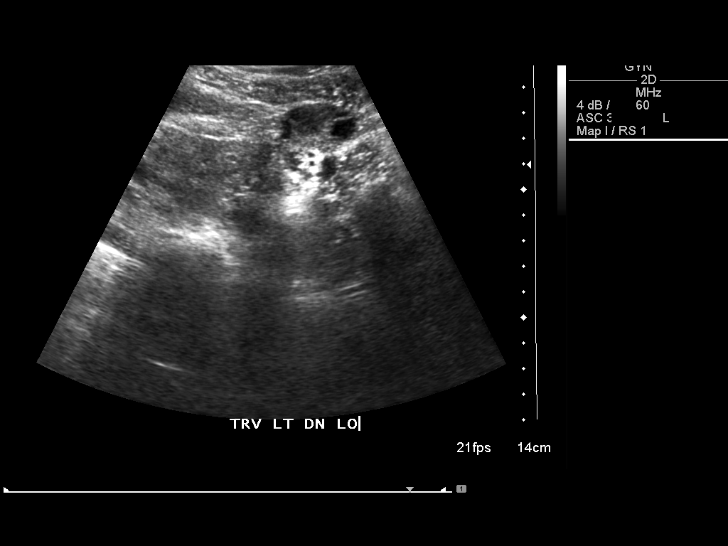
[im 19/56]
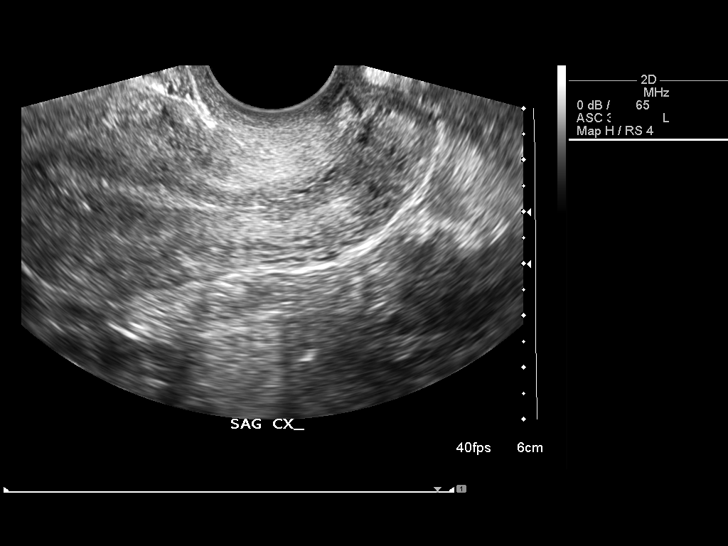
[im 23/56]
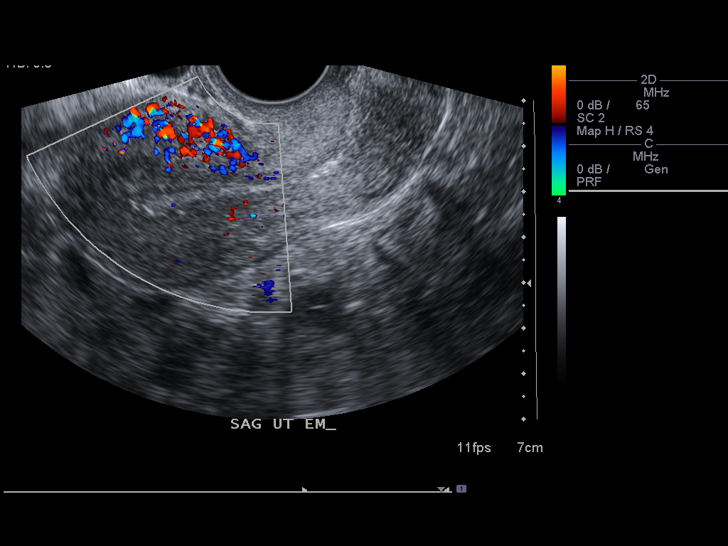
[im 28/56]
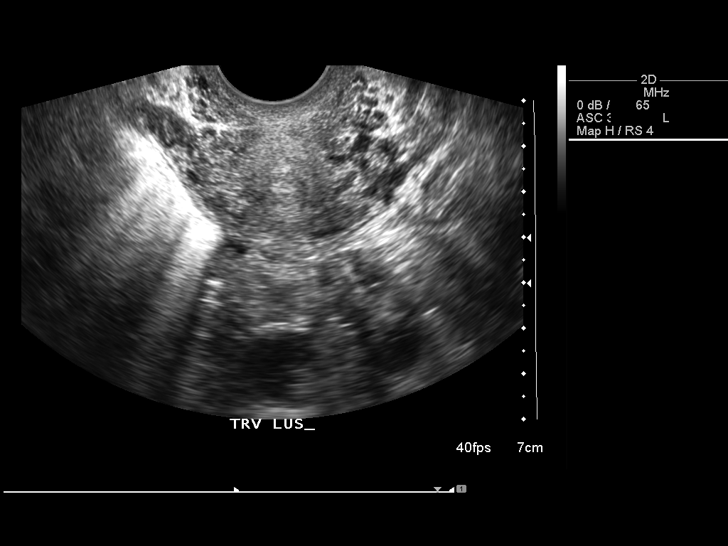
[im 33/56]
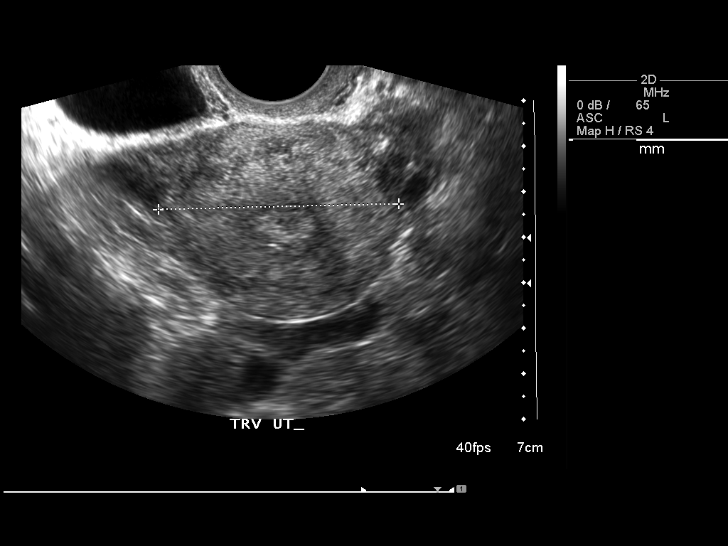
[im 37/56]
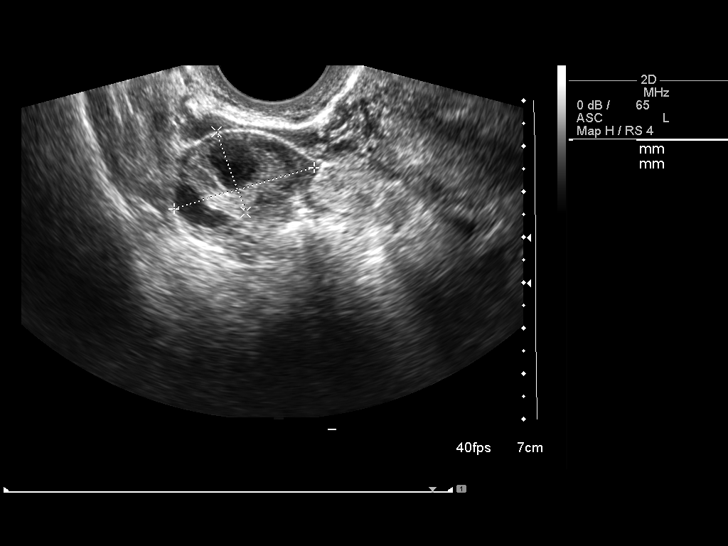
[im 42/56]
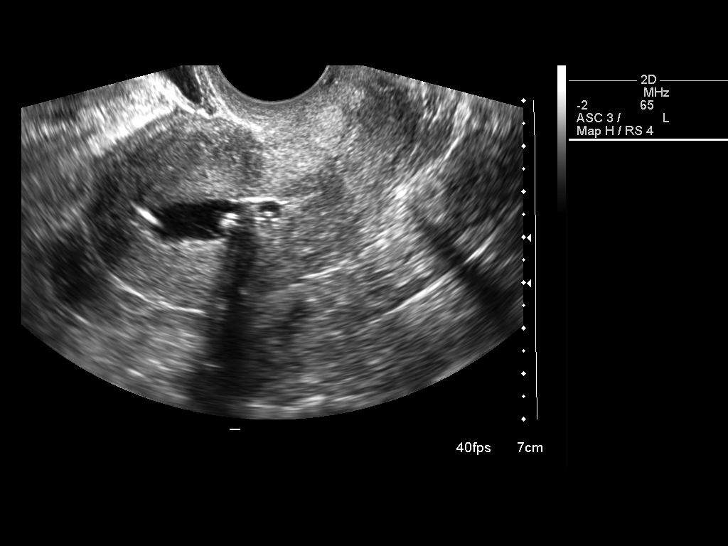
[im 46/56]
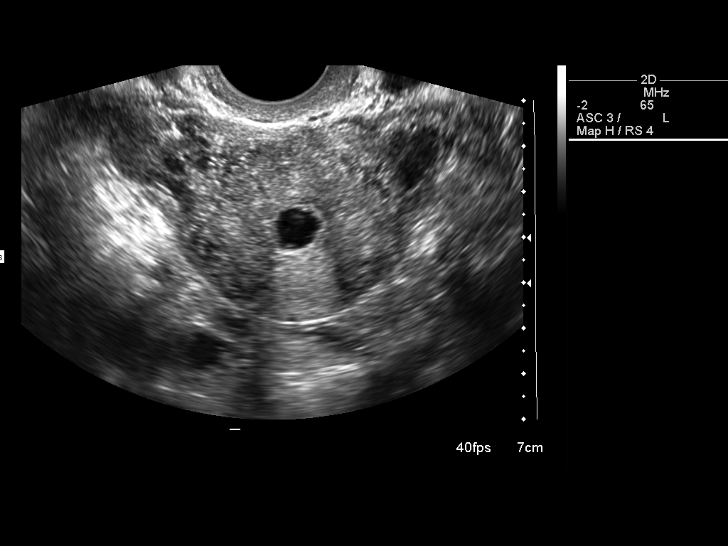
[im 51/56]
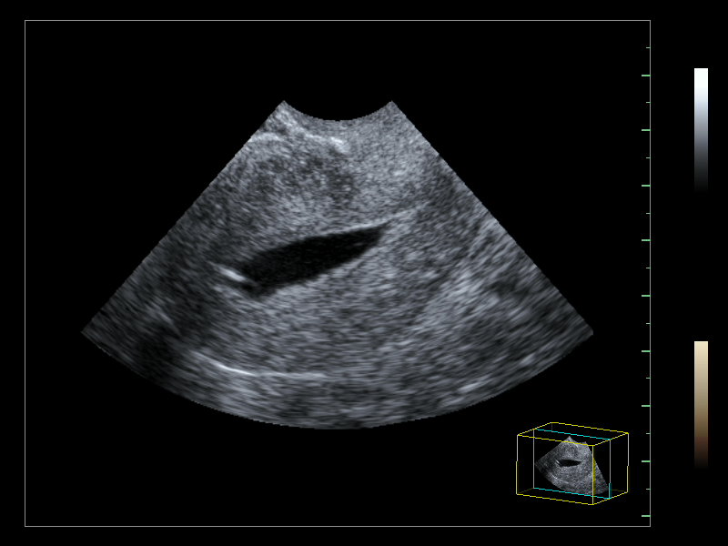
[im 56/56]
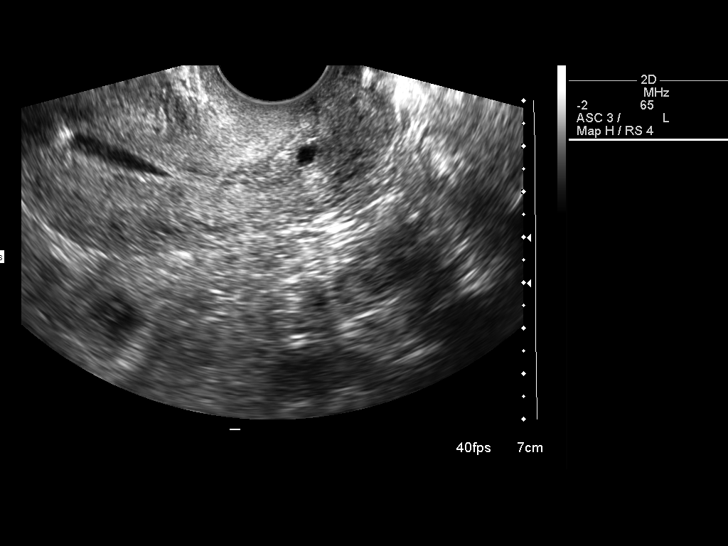

[13 of 25 positions shown; findings below may reference images not displayed]

FINDINGS: Multiple images of the uterus and adnexa were obtained using a transabdominal and endovaginal approach.  The uterus demonstrates a sagittal length of 8.5 cm and AP width of 4.6 cm and a transverse width of 5.3 cm.  A homogeneous uterine myometrium is seen.  The endometrial canal appears homogeneously echogenic with an AP width of 7.5 mm.  No areas of focal thickening or inhomogeneity are noted.  This is better assessed with sonohysterography. 
 Both ovaries are seen and have a normal appearance with the left ovary measuring 2.7 x 2.9 x 1.7 cm and the right ovary measuring 3.2 x 1.9 x 1.8 cm and also having a normal appearance.  Both ovaries contain multiple subcentimeter follicles.
IMPRESSION: Normal pelvic ultrasound. 
 SONOHYSTEROGRAM:
FINDINGS: A normal endometrial lining is identified demonstrating a single layer of thickness of 2 mm.  No area of focal thickening or inhomogeneity were noted either at real time or 3 dimensional sweeps in the sagittal, transverse, and coronal planes.
IMPRESSION: Normal sonohysterogram with no evidence for focal or diffuse endometrial abnormality.

## 2007-08-08 ENCOUNTER — Ambulatory Visit (HOSPITAL_COMMUNITY): Admission: RE | Admit: 2007-08-08 | Discharge: 2007-08-09 | Payer: Self-pay | Admitting: Obstetrics and Gynecology

## 2007-08-08 ENCOUNTER — Encounter (INDEPENDENT_AMBULATORY_CARE_PROVIDER_SITE_OTHER): Payer: Self-pay | Admitting: Obstetrics and Gynecology

## 2008-06-01 ENCOUNTER — Other Ambulatory Visit: Admission: RE | Admit: 2008-06-01 | Discharge: 2008-06-01 | Payer: Self-pay | Admitting: Family Medicine

## 2009-05-07 ENCOUNTER — Encounter: Admission: RE | Admit: 2009-05-07 | Discharge: 2009-05-07 | Payer: Self-pay | Admitting: Family Medicine

## 2009-06-06 ENCOUNTER — Other Ambulatory Visit: Admission: RE | Admit: 2009-06-06 | Discharge: 2009-06-06 | Payer: Self-pay | Admitting: Family Medicine

## 2009-08-23 ENCOUNTER — Ambulatory Visit: Payer: Self-pay | Admitting: Family Medicine
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
## 2010-10-11 ENCOUNTER — Encounter: Payer: Self-pay | Admitting: Obstetrics and Gynecology

## 2010-10-12 ENCOUNTER — Encounter: Payer: Self-pay | Admitting: Obstetrics and Gynecology

## 2011-02-03 NOTE — H&P (Signed)
Kaitlyn Kane, Kaitlyn Kane         ACCOUNT NO.:  [id number]   MEDICAL RECORD NO.:  [id number]          PATIENT TYPE:  AMB   LOCATION:  [facility]                           FACILITY:  [facility]   PHYSICIAN:  Osborn Coho, M.D.   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  Over  the span of 6 months, the patient reports bleeding which would occur two  days before her menstrual flow which would follow 7 days later.  The  patient's actual flow would last up to 8 days during which time she  complained of cramps which she rated as a 3/10 on a 10-point pain scale  and were not relieved by over-the-counter analgesia.  Once the patient's  menstrual flow ended, she would continue to experience a dull achy  pelvic pain along with positional dyspareunia.  A pelvic ultrasound  along with Sonohystogram done in September 2008 revealed a uterus  measuring 8.5 x 4.6 x 5.3 cm with normal appearing ovaries bilaterally  and no evidence of focal or diffuse endometrial abnormalities.  Endometrial biopsy performed showed only benign changes.  The patient's  TSH was within normal limits.  The patient was placed on Lybrel oral  contraceptives, which it did establish some predictable timing of her  menstrual flow.  However, her other complaints did not resolve.  In  August 2008, the patient had a Pap smear which returned atypical  squamous cells of undetermined insignificance along with high-risk HPV.  Her colposcopy biopsy revealed CIN-II.  The patient was given the  options of cryotherapy or LEEP procedure along with other medical and  surgical options for managing all of her symptoms.  After careful  consideration, the patient  has decided to proceed with a total vaginal  hysterectomy as a means of definitive therapy.   OBSTETRICAL HISTORY:  Gravida 5, para 3-0-2-3.  The patient has a  history of postpartum depression.   GYNECOLOGY HISTORY:  Menarche 40 years old.  Last menstrual period  June 24, 2007.  She uses oral contraceptives.  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  Last Pap smear was August 2008 which revealed  atypical cells of undetermined significance with a colposcopy revealing  CIN-II.   MEDICAL HISTORY:  1. Irritable bowel syndrome.  2. Anemia.  3. Left breast lipoma.  4. Depression.   SURGICAL HISTORY:  In 2004 a D&C for missed AB.   FAMILY HISTORY:  Epilepsy, lung and prostate cancers, depression,  anemia, cardiovascular disease and stroke.   SOCIAL HISTORY:  The patient is married and she is a Production assistant, radio.   HABITS:  She does not use tobacco or alcohol.   CURRENT MEDICATIONS:  Lybrel one tablet daily.   ALLERGIES:  She has no known drug allergies.   REVIEW OF SYSTEMS:  The patient denies any chest pain, shortness of  breath, fever,  vaginitis symptoms, urinary tract symptoms, changes in  bowel habits, and except as is mentioned in history of present illness,  the patient's review of systems is negative.   PHYSICAL EXAMINATION:  VITAL SIGNS:  Blood pressure 100/62, weight is  140 pounds, height 5 feet 9 inches tall, pulse is 62.  NECK:  Supple without masses.  There is no thyromegaly or cervical  adenopathy.  HEART: Regular rate and rhythm.  LUNGS:  Clear.  BREASTS:  Without masses, discharge, skin changes or nipple retractions.  The patient does have a left chest wall lipoma approximately 3 cm x 4 cm  which is mobile, nontender without nodularity.  BACK:  No CVA tenderness.  ABDOMEN:  There is diffuse tenderness, but no guarding or rebound  organomegaly.  EXTREMITIES:  No clubbing, cyanosis or edema.  PELVIC:  EG/BUS was normal.  Vagina is normal.  Cervix is normal  and  nontender without lesions.  Uterus appears normal size, shape and  consistency with tenderness.  Adnexa no masses but tenderness  bilaterally.   IMPRESSION:  1. Pelvic pain.  2. Dysfunctional uterine bleeding.  3. CIN-II.   DISPOSITION:  A discussion was held with the patient regarding the  indications for her procedure along with its risks which include but are  not limited to reaction to anesthesia, damage to adjacent organs,  infection, excessive bleeding and the possibility that an abdominal  incision may be needed to complete her surgery safely.  The patient has  consented to proceed with a total vaginal hysterectomy with the  possibility of total abdominal hysterectomy at [facility]  [place] on Monday August 08, 2007, at 10:00 a.m.      [place] J. Adline Peals.      Osborn Coho, M.D.  Electronically Signed    EJP/MEDQ  D:  08/07/2007  T:  08/08/2007  Job:  [id number]

## 2011-02-03 NOTE — Op Note (Signed)
NAMESHRILEY, JOFFE         ACCOUNT NO.:  [id number]   MEDICAL RECORD NO.:  [id number]          PATIENT TYPE:  OIB   LOCATION:  9317                          FACILITY:  [facility]   PHYSICIAN:  Osborn Coho, M.D.   DATE OF BIRTH:  [birth year]   DATE OF PROCEDURE:  DATE OF DISCHARGE:                               OPERATIVE REPORT   PREOPERATIVE DIAGNOSES:  1. Dysfunctional uterine bleeding.  2. Pelvic pain.  3. Cervical intraepithelial neoplasia II.   POSTOPERATIVE DIAGNOSES:  1. Dysfunctional uterine bleeding.  2. Pelvic pain.  3. Cervical intraepithelial neoplasia II.   PROCEDURE:  1. Total vaginal hysterectomy.  2. Cystoscopy.   ATTENDING:  Osborn Coho, M.D.   ASSISTANT:  Marquis Lunch. Powell, P.A.C.   ANESTHESIA:  General.   FINDINGS:  Normal size uterus and cervix.   SPECIMENS TO PATHOLOGY:  Uterus and cervix.   FLUIDS:  1000 mL.   URINE OUTPUT:  50 mL.   ESTIMATED BLOOD LOSS:  25 mL.   COMPLICATIONS:  None.   PROCEDURE:  The patient was taken to the operating room after risks,  benefits, alternatives reviewed with the patient.  The patient  verbalized understanding and consent signed and witnessed.  The patient  was placed under general per anesthesia and prepped and draped in a  normal sterile fashion in the dorsal lithotomy position.  A weighted  speculum was placed in the patient's vagina and the anterior lip of  cervix grasped with single-tooth tenaculum.  A total of 20 mL of dilute  Pitressin was administered circumferentially around the cervix.  The  cervix was circumscribed with the Bovie.  The posterior vaginal cuff was  then entered with the Mayo scissors and Bonanno weighted speculum  placed.  The bladder was then dissected away from the pubocervical  fascia and the anterior cul-de-sac entered without difficulty.  Deaver  was placed for retraction.  The curved Heaney was used to clamp, cut and  suture ligate the paracervical tissue on the  patient's left.  Suture  ligation was performed using 0 Vicryl.  The same was done on the  contralateral side and both incorporated the uterosacral and cardinal  ligaments.  The parametrial tissue was in a similar fashion clamped with  a curved Heaney, cut and suture ligated using 0 Vicryl to the level of  the utero-ovarian pedicles.  The fundus was then exteriorized and the  utero-ovarian pedicle on the patient's right was clamped with a large  Kelly cut and tied with the 0 Vicryl tie and suture ligated with 0  Vicryl as well.  The same was done on the contralateral side.  There was  some bleeding noted of the right ovary which was stitched with 3-0  Vicryl on an SH.  There was still some oozing noted which was made  hemostatic with the Bovie and to assure hemostasis continued.  Gelfoam  was placed on this area.  There was good hemostasis of the bilateral  utero-ovarian pedicles.  The McCall culdoplasty stitch was then  performed using 0 Vicryl incorporating the bilateral uterosacral  ligaments.  The vaginal cuff was then repaired with 0  Vicryl via figure-  of-eight stitches.  Good hemostasis was noted.  The McCall cul-de-sac  plasty stitch was tied.  There was good hemostasis of the vaginal cuff.  Cystoscopy was performed and bilateral efflux was noted.  Indigo carmine  had been administered prior to cystoscopy.  Sponge, lap and needle count  was correct.  The patient tolerated the procedure well and was returned  to the recovery room in good condition.      Osborn Coho, M.D.  Electronically Signed     AR/MEDQ  D:  08/08/2007  T:  08/09/2007  Job:  [id number]
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
## 2011-02-06 NOTE — Op Note (Signed)
Kaitlyn Kane, Kaitlyn Kane                     ACCOUNT NO.:  [id number]   MEDICAL RECORD NO.:  [id number]                   PATIENT TYPE:  AMB   LOCATION:  SDC                                  FACILITY:  [facility]   PHYSICIAN:  Osborn Coho, M.D.                DATE OF BIRTH:  Feb 13, 1971   DATE OF PROCEDURE:  09/14/2003  DATE OF DISCHARGE:                                 OPERATIVE REPORT   PREOPERATIVE DIAGNOSIS:  Missed abortion.   POSTOPERATIVE DIAGNOSIS:  Missed abortion.   PROCEDURE:  Suction dilation and curettage.   ANESTHESIA:  MAC.   ATTENDING:  Osborn Coho, M.D.   FLUIDS REPLACED:  1200 mL.   URINE OUTPUT:  Approximately 200 mL via straight catheter prior to  procedure.   ESTIMATED BLOOD LOSS:  Minimal.   COMPLICATIONS:  None.   FINDINGS:  Moderate POC, sent to pathology.   DESCRIPTION OF PROCEDURE:  The patient was taken to the operating room after  the risks, benefits, and alternatives were discussed with the patient.  The  patient verbalized understanding and consent signed and witnessed.  The  patient was placed under MAC anesthesia and prepped and draped in the normal  sterile fashion.  A bivalve speculum was placed in the patient's vagina and  a paracervical block administered using a total of 10 mL of 2% lidocaine.  The anterior lip of the cervix was grasped with a single-tooth tenaculum.  The cervix was dilated for passage of a size 7 suction curette.  The uterus  was sounded to approximately 8 cm.  Suction curettage was performed until  minimal tissue returned.  Sharp curettage was performed until a gritty  texture was noted.  Suction curettage was performed once again to remove any  remaining debris.  Instruments were removed.  There was a small amount of  bleeding noted from the left tenaculum site, which was made hemostatic with  silver nitrate.  Sponge count was correct.  The patient tolerated the  procedure well and was returned to the recovery room  in good condition.                                               Osborn Coho, M.D.    AR/MEDQ  D:  09/14/2003  T:  09/14/2003  Job:  [id number]
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
## 2011-02-06 NOTE — H&P (Signed)
Kaitlyn Kane, Kaitlyn Kane         ACCOUNT NO.:  [id number]   MEDICAL RECORD NO.:  [id number]           PATIENT TYPE:   LOCATION:                                 FACILITY:   PHYSICIAN:  Crist Fat. Rivard, M.D.      DATE OF BIRTH:   DATE OF ADMISSION:  DATE OF DISCHARGE:                                HISTORY & PHYSICAL   Kaitlyn Kane is a 40 year old gravida 5, para 2-0-2-2 who presented to the  office of [facility] this morning for routine evaluation.  She has been having  irregular contractions, positive bloody show, no active bleeding, no rupture  of membranes.  On examination her cervix is 4 cm dilated and stretchy, 70%  effaced with the vertex at a -1 station and membranes intact.  She is to be  directly admitted to [facility] in early labor.  She  reports positive fetal movement, no vaginal bleeding, no rupture of  membranes.  She denies any headache, visual changes, or epigastric pain.  Her pregnancy has been followed by the C.N.M. service at [facility] and is  remarkable for advanced maternal age, amniocentesis declined, first  trimester spotting, history of macrosomia, history of postpartum hemorrhage.  Her group B Strep is negative.  This patient entered prenatal care at the  office of [facility] on October 08, 2004 at approximately [redacted] weeks gestation.  EDC  determined by dates and confirmed with ultrasound.  Her pregnancy has been  essentially unremarkable.  She has been size equal to dates throughout,  normotensive with no proteinuria.   PRENATAL LABORATORIES:  On October 08, 2004 hemoglobin and hematocrit 12.3  and 37.7, platelets 282,000.  Blood type an Rh A+.  Antibody screen  negative.  VDRL nonreactive.  Rubella immune.  Hepatitis B surface antigen  negative.  HIV nonreactive.  GC and Chlamydia negative.  Pap smear within  normal limits.  Quad screen declined.  At 18 weeks one-hour glucose  challenge within normal limits.  At 28 weeks one-hour glucose challenge  within normal limits.  RPR negative.  At 36 weeks culture of the vaginal  tract is negative for group B Strep.   OB HISTORY:  In 2001 patient had a normal spontaneous vaginal delivery at 57-  1/7 weeks with the birth of a 7 pound 10 ounce female infant named Industrial/product designer with  no complications.  In 2003 patient had a normal spontaneous vaginal delivery  at 41 weeks with the birth of a 9 pound 14 ounce female infant named  Teacher, adult education.  She had a postpartum hemorrhage which required manual removal of  placenta.  In 2004 in August patient had a first trimester SAB and also in  December she had a first trimester SAB and then the present pregnancy.   MEDICAL HISTORY:  Unremarkable.   FAMILY HISTORY:  Maternal grandmother MI, patient's mother MI, patient's  mother with anemia, a sister with epilepsy, maternal grandmother stroke,  maternal uncle lung cancer, maternal grandfather prostate cancer.   GENETIC HISTORY:  There is no family history of familial or chromosomal  disorders, children that died in infancy or that were born  with birth  defects.  Patient has no no known drug allergies.  She denies the use of  tobacco, alcohol, or illicit drugs.   SOCIAL HISTORY:  Ms. Kaitlyn Kane is a married 40 year old Caucasian female.  She owns a [employer].  Her husband, Leianna Barga, works in Special educational needs teacher.  He is involved and supportive.  They are [place] in their  faith.   REVIEW OF SYSTEMS:  As described above.  Patient is typical of one with a  uterine pregnancy at term in early labor with advanced dilation to 4-5 cm.   PHYSICAL EXAMINATION:  VITAL SIGNS:  Stable.  Patient is afebrile.  HEENT:  Unremarkable.  HEART:  Regular rate and rhythm.  LUNGS:  Clear.  ABDOMEN:  Gravid in its contour.  Uterine fundus is noted to extend 39 cm  above the level of the pubic symphysis.  Leopold's maneuver finds the infant  to be in a longitudinal lie, cephalic presentation and the estimated fetal  weight is  8-8-1/2 pounds.  Fetal heart tones are 150s by Doppler.  PELVIC:  Digital examination of the cervix finds it to be 4-5 cm dilated,  70% effaced with the vertex at a -1 station and membranes intact.  EXTREMITIES:  No pathologic edema.  DTRs are 1+ with no clonus.   ASSESSMENT:  Intrauterine pregnancy at term in early labor.   PLAN:  Admit per Dr. Dois Davenport Rivard.  Routine C.N.M. orders.       SDM/MEDQ  D:  04/27/2005  T:  04/27/2005  Job:  [id number]

## 2011-06-30 LAB — BASIC METABOLIC PANEL
BUN: 3 — ABNORMAL LOW
CO2: 27
Calcium: 8.2 — ABNORMAL LOW
Chloride: 108
Creatinine, Ser: 0.7
GFR calc Af Amer: >60
GFR calc non Af Amer: >60
Glucose, Bld: 137 — ABNORMAL HIGH
Potassium: 3.2 — ABNORMAL LOW
Sodium: 139

## 2011-06-30 LAB — CBC
HCT: 33.4 — ABNORMAL LOW
HCT: 38.6
Hemoglobin: 11.5 — ABNORMAL LOW
Hemoglobin: 13.2
MCHC: 34.4
MCHC: 34.3
MCV: 93.4
MCV: 92.9
Platelets: 247
Platelets: 265
RBC: 3.58 — ABNORMAL LOW
RBC: 4.16
RDW: 12.1
RDW: 12
WBC: 10.4
WBC: 6.1

## 2011-06-30 LAB — HCG, SERUM, QUALITATIVE: Preg, Serum: NEGATIVE

## 2011-07-07 ENCOUNTER — Other Ambulatory Visit: Payer: Self-pay | Admitting: Family Medicine

## 2011-07-08 ENCOUNTER — Other Ambulatory Visit: Payer: Self-pay | Admitting: Family Medicine

## 2011-07-08 DIAGNOSIS — Z1231 Encounter for screening mammogram for malignant neoplasm of breast: Secondary | ICD-10-CM

## 2011-07-15 ENCOUNTER — Ambulatory Visit
Admission: RE | Admit: 2011-07-15 | Discharge: 2011-07-15 | Disposition: A | Discharge status: Home / Self Care | Payer: PRIVATE HEALTH INSURANCE | Source: Ambulatory Visit | Attending: Family Medicine | Admitting: Family Medicine

## 2011-07-15 DIAGNOSIS — Z1231 Encounter for screening mammogram for malignant neoplasm of breast: Secondary | ICD-10-CM

## 2011-07-15 IMAGING — MG MM DIGITAL SCREENING BILAT
4 series · 4 of 4 positions shown · non-contrast
Comparison: none

DG SCREEN MAMMOGRAM BILATERAL
Bilateral CC and MLO view(s) were taken.

DIGITAL SCREENING MAMMOGRAM WITH CAD:
There are scattered fibroglandular densities.  No masses or malignant type calcifications are 
identified.  Compared with prior studies.
Images were processed with CAD.

[R CC]
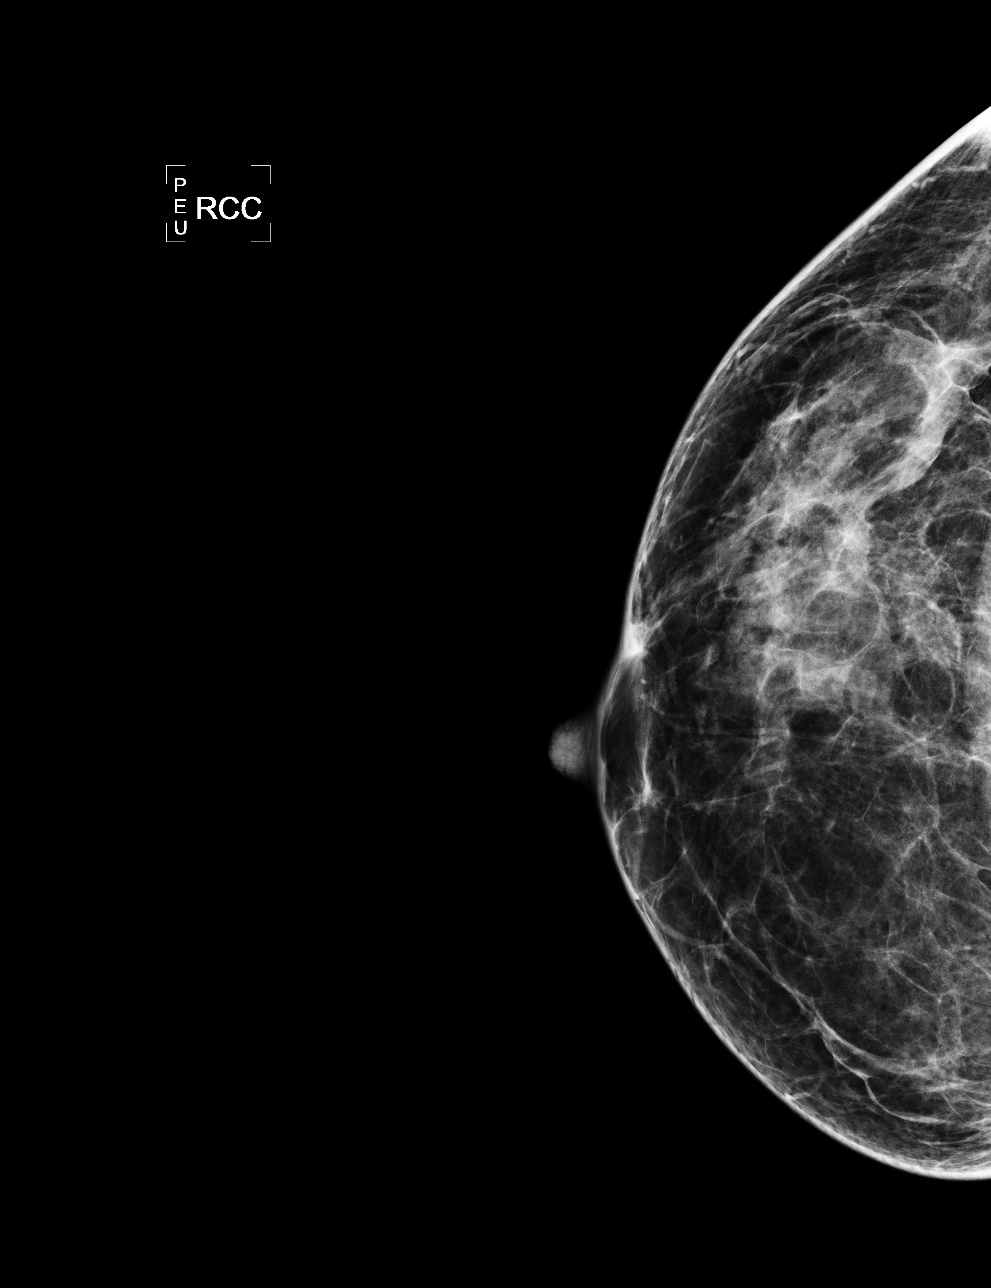

[L CC]
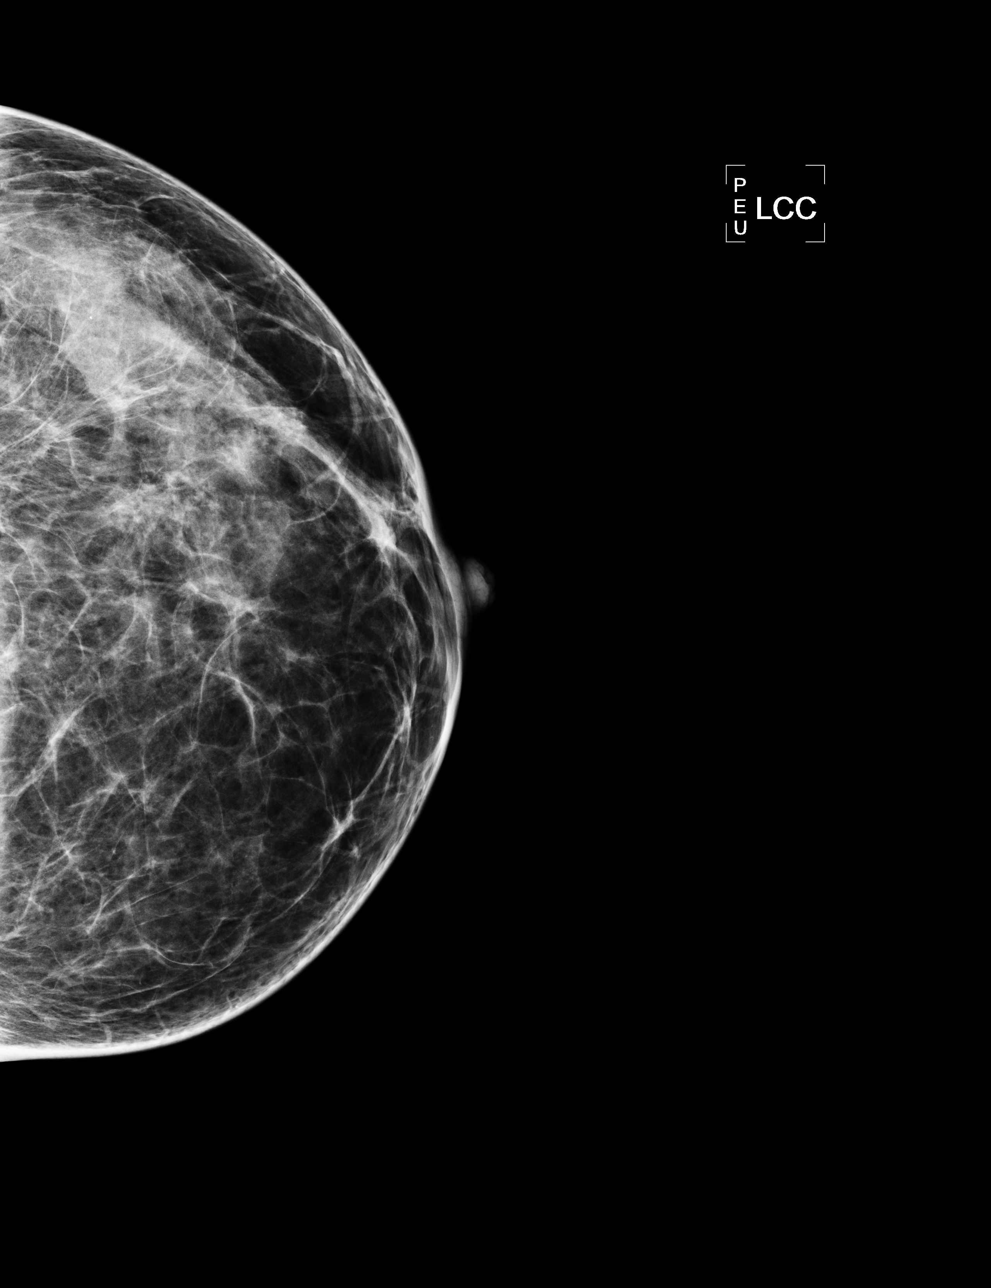

[L MLO]
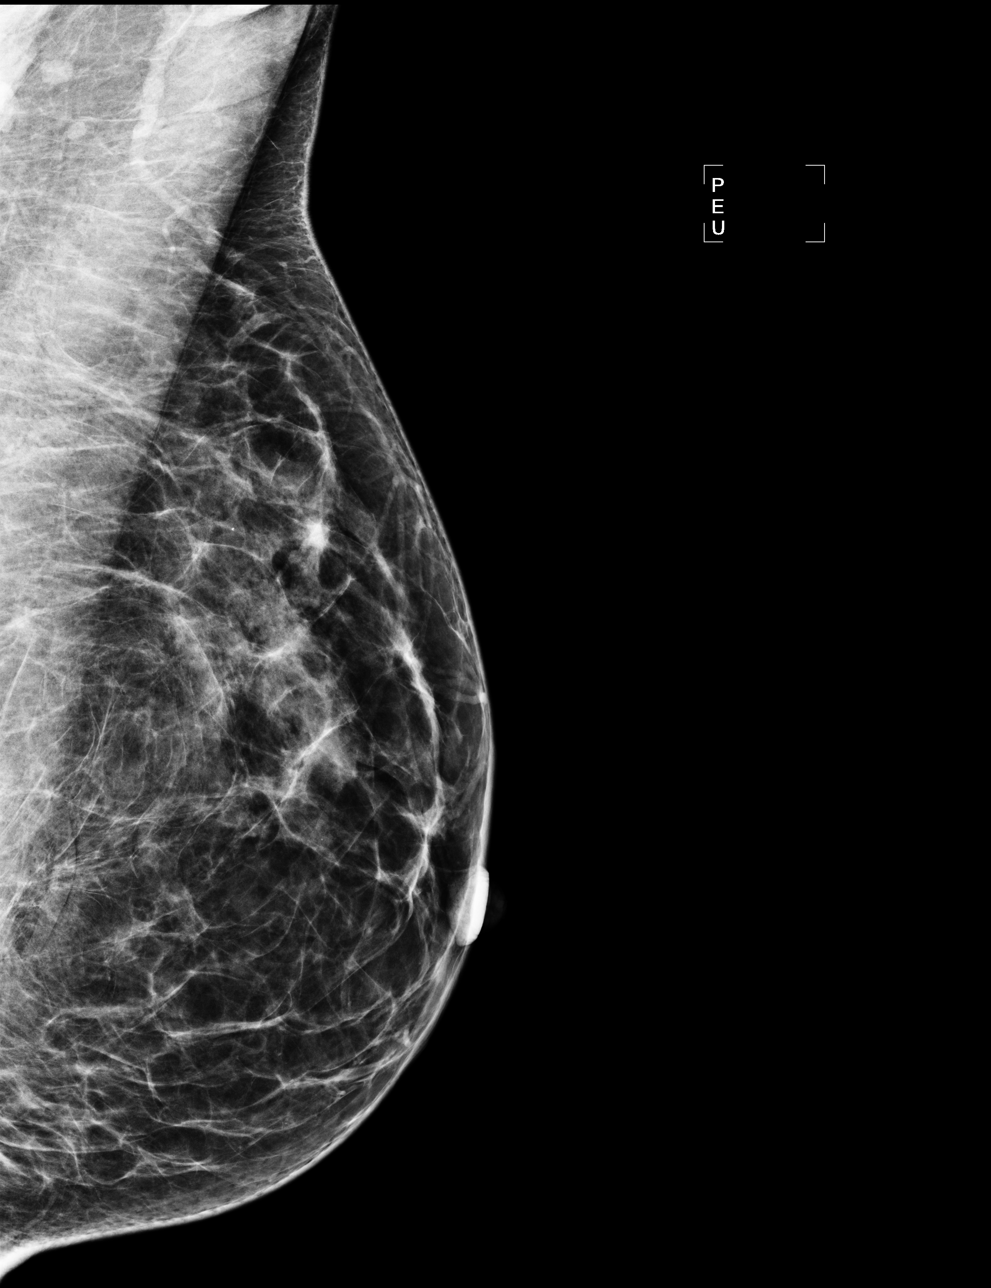

[R MLO]
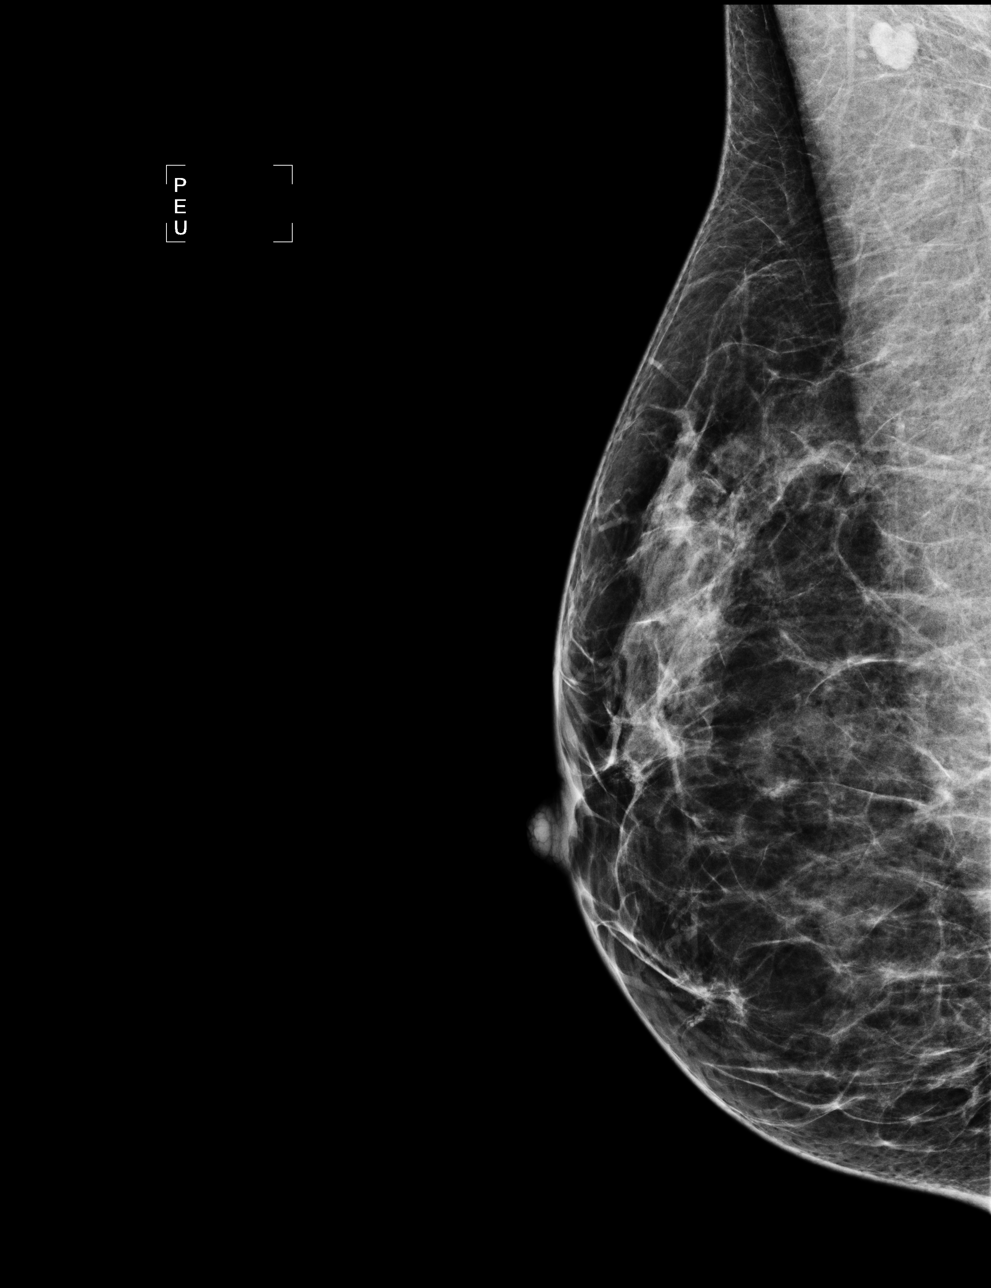

[4 of 4 positions shown; findings below may reference images not displayed]

IMPRESSION: No specific mammographic evidence of malignancy.  Next screening mammogram is recommended in one 
year.

A result letter of this screening mammogram will be mailed directly to the patient.

ASSESSMENT: Negative - BI-RADS 1

Screening mammogram in 1 year.
,

## 2012-01-14 DIAGNOSIS — M25559 Pain in unspecified hip: Secondary | ICD-10-CM | POA: Insufficient documentation

## 2015-09-05 ENCOUNTER — Other Ambulatory Visit: Payer: Self-pay

## 2015-09-05 DIAGNOSIS — Z1231 Encounter for screening mammogram for malignant neoplasm of breast: Secondary | ICD-10-CM

## 2015-09-26 ENCOUNTER — Ambulatory Visit
Admission: RE | Admit: 2015-09-26 | Discharge: 2015-09-26 | Disposition: A | Discharge status: Home / Self Care | Payer: BLUE CROSS/BLUE SHIELD | Source: Ambulatory Visit

## 2015-09-26 DIAGNOSIS — Z1231 Encounter for screening mammogram for malignant neoplasm of breast: Secondary | ICD-10-CM

## 2015-09-26 IMAGING — MG MM DIGITAL SCREENING BILAT W/ CAD
5 series · 5 of 5 positions shown · non-contrast
Comparison: Previous exam(s).

CLINICAL DATA: Screening.

EXAM:
DIGITAL SCREENING BILATERAL MAMMOGRAM WITH CAD

[R CC]
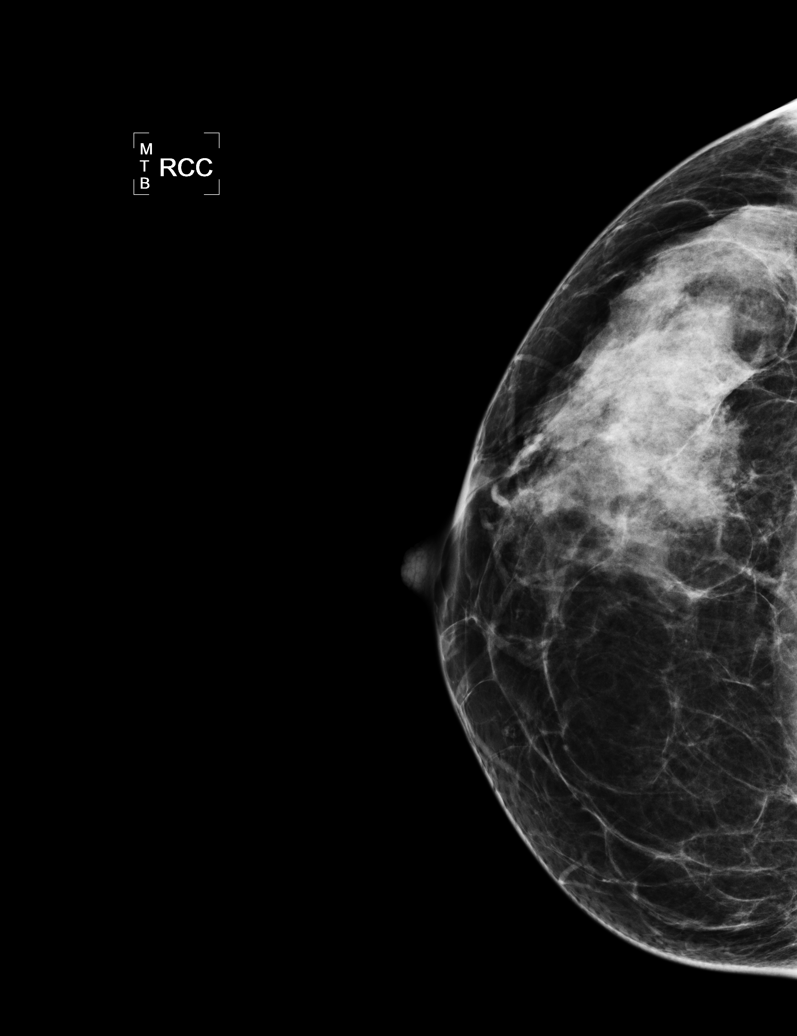

[L CC]
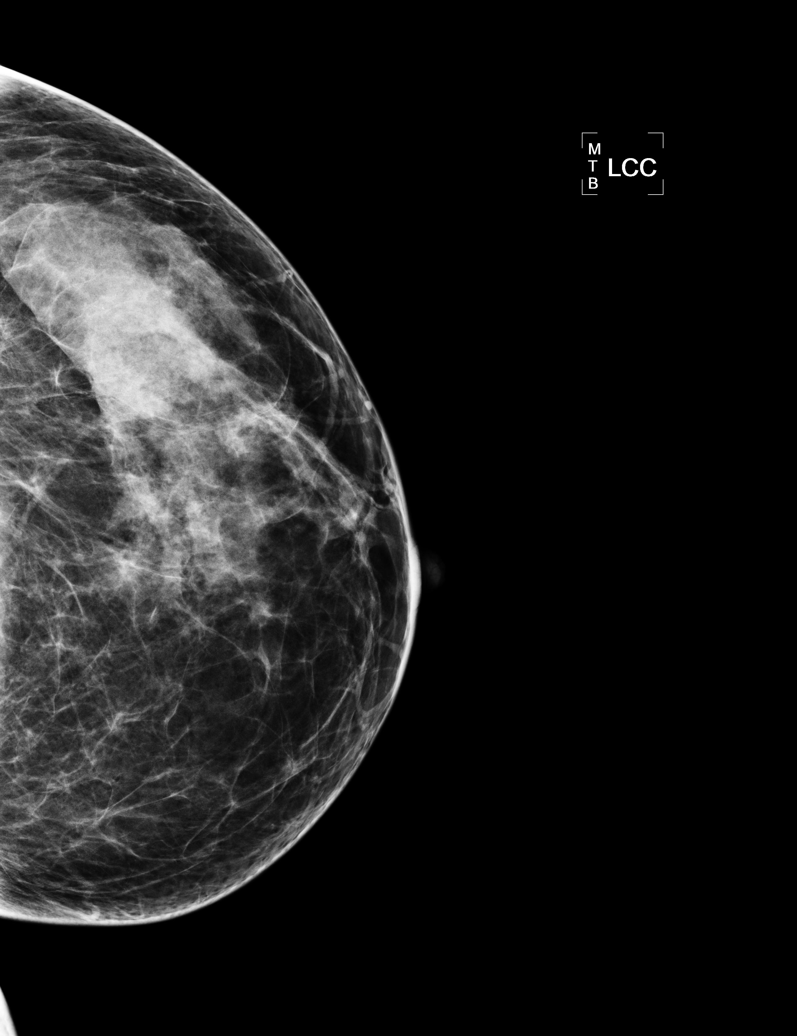

[L MLO (1 of 2)]
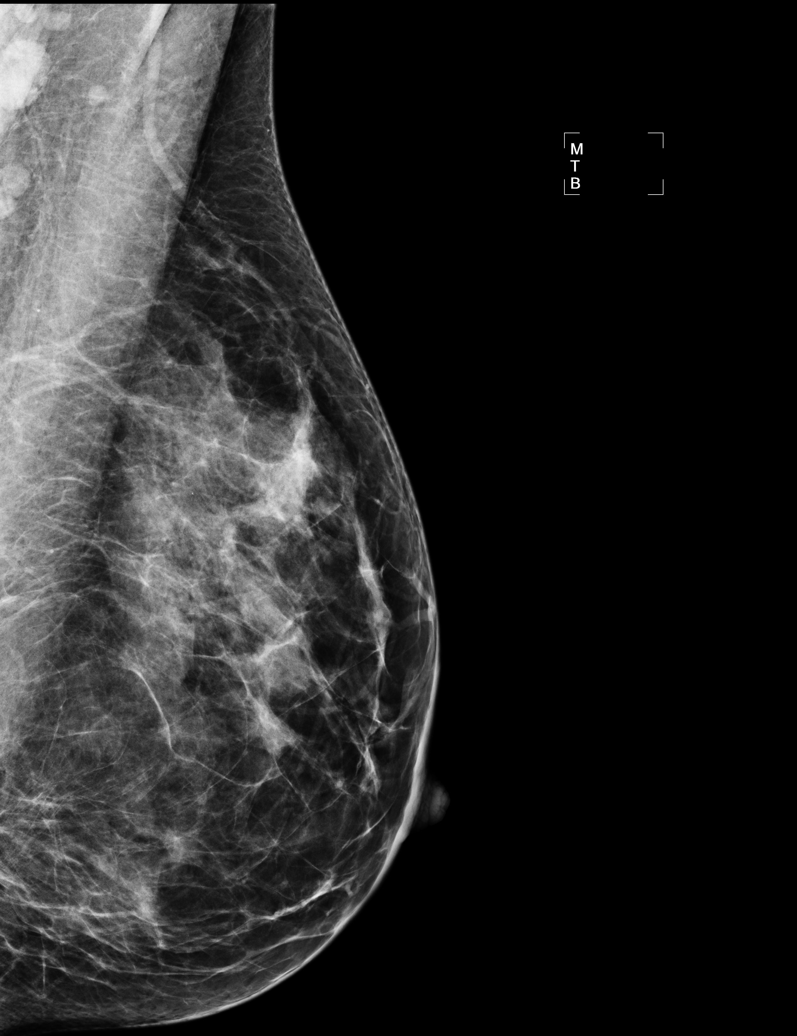

[R MLO]
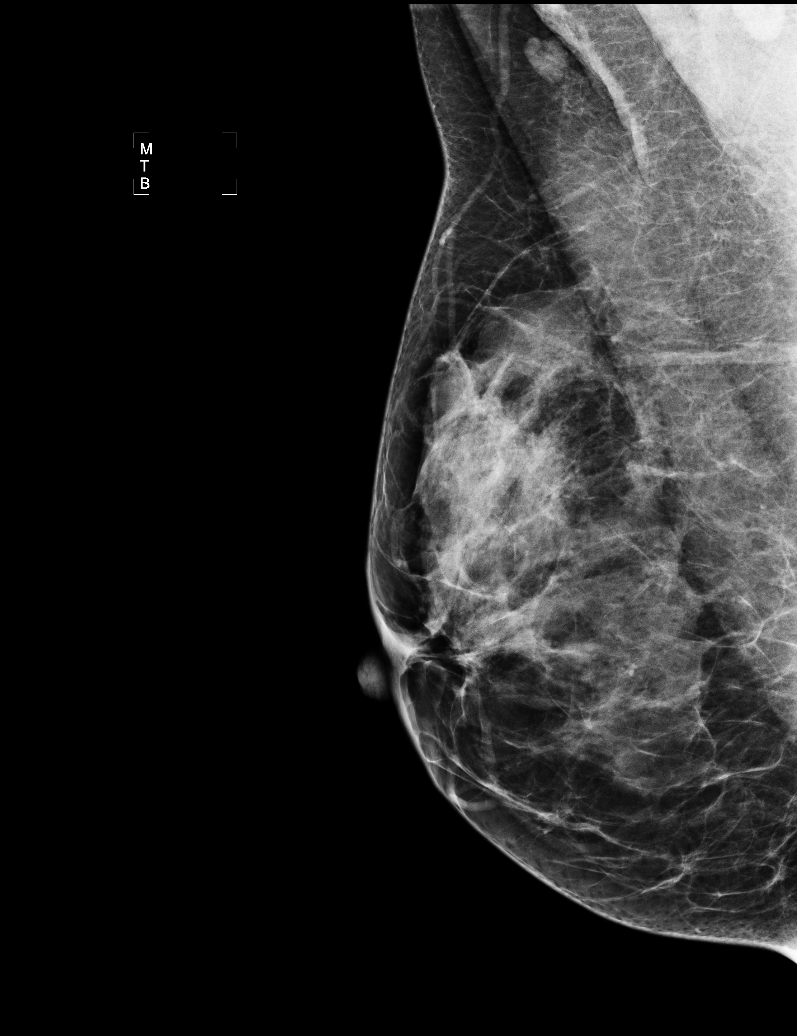

[L MLO (2 of 2)]
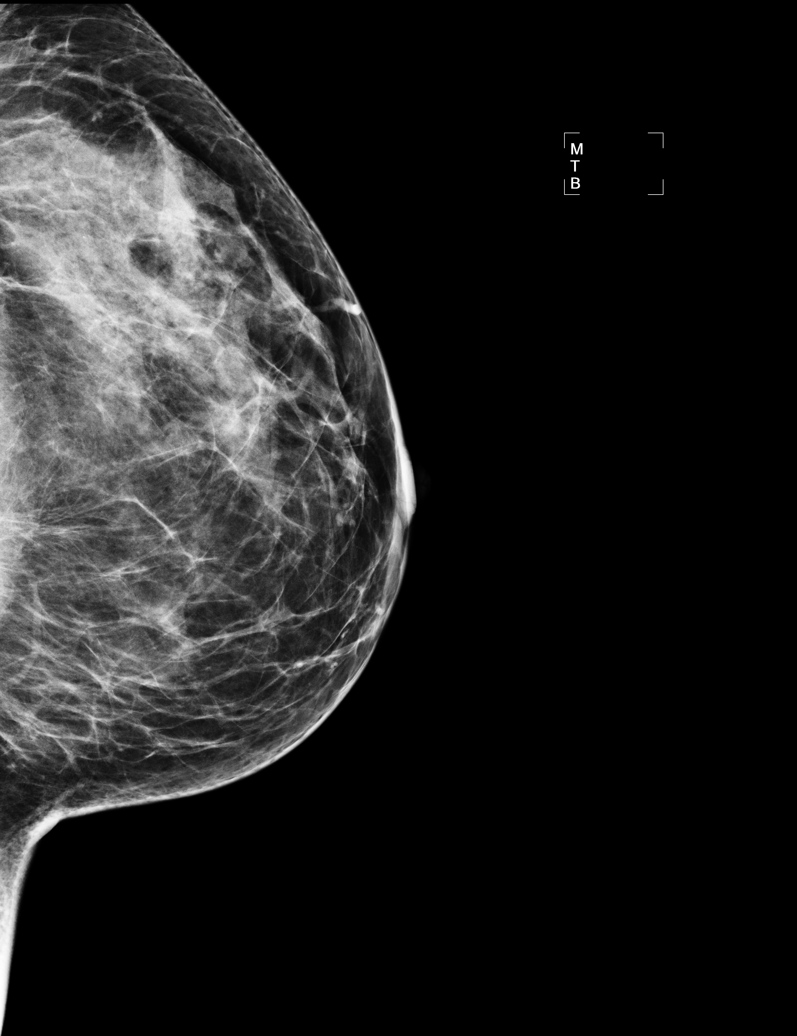

[5 of 5 positions shown; findings below may reference images not displayed]

ACR Breast Density Category c: The breast tissue is heterogeneously
dense, which may obscure small masses.
FINDINGS: There are no findings suspicious for malignancy. Images were
processed with CAD.
IMPRESSION: No mammographic evidence of malignancy. A result letter of this
screening mammogram will be mailed directly to the patient.

RECOMMENDATION:
Screening mammogram in one year. (Code:[0J])

BI-RADS CATEGORY  1: Negative.

## 2017-02-01 ENCOUNTER — Other Ambulatory Visit: Payer: Self-pay | Admitting: Physician Assistant

## 2017-02-01 DIAGNOSIS — Z1231 Encounter for screening mammogram for malignant neoplasm of breast: Secondary | ICD-10-CM

## 2017-02-24 ENCOUNTER — Ambulatory Visit
Admission: RE | Admit: 2017-02-24 | Discharge: 2017-02-24 | Disposition: A | Discharge status: Home / Self Care | Payer: BLUE CROSS/BLUE SHIELD | Source: Ambulatory Visit | Attending: Physician Assistant | Admitting: Physician Assistant

## 2017-02-24 DIAGNOSIS — Z1231 Encounter for screening mammogram for malignant neoplasm of breast: Secondary | ICD-10-CM

## 2017-02-24 IMAGING — MG 2D DIGITAL SCREENING BILATERAL MAMMOGRAM WITH CAD AND ADJUNCT TO
9 of 12 series · 9 of 28 positions shown · non-contrast
Comparison: Previous exam(s).

CLINICAL DATA: Screening.

EXAM:
2D DIGITAL SCREENING BILATERAL MAMMOGRAM WITH CAD AND ADJUNCT TOMO

[L MLO synth-2D]
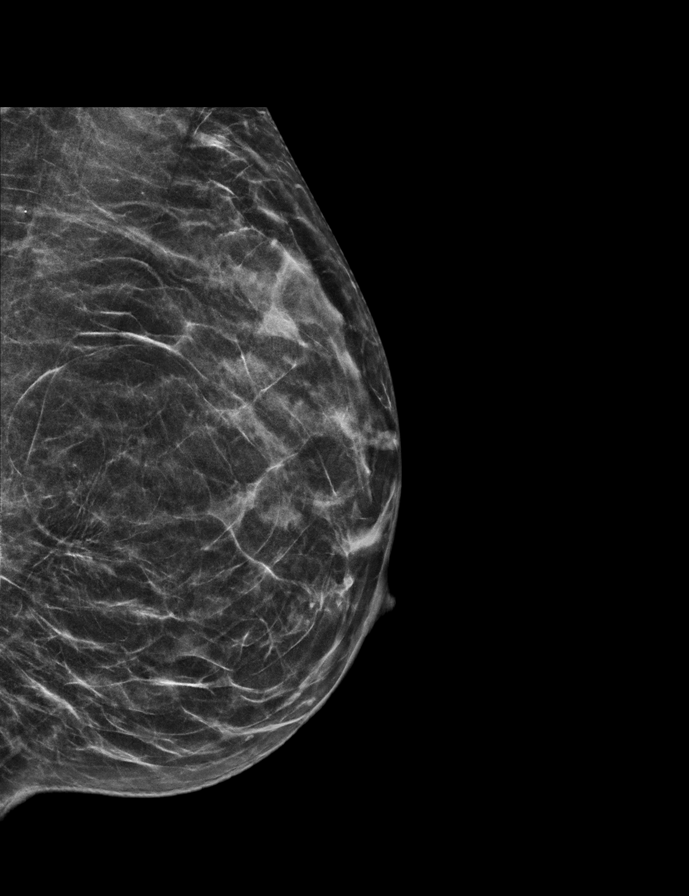

[L CC]
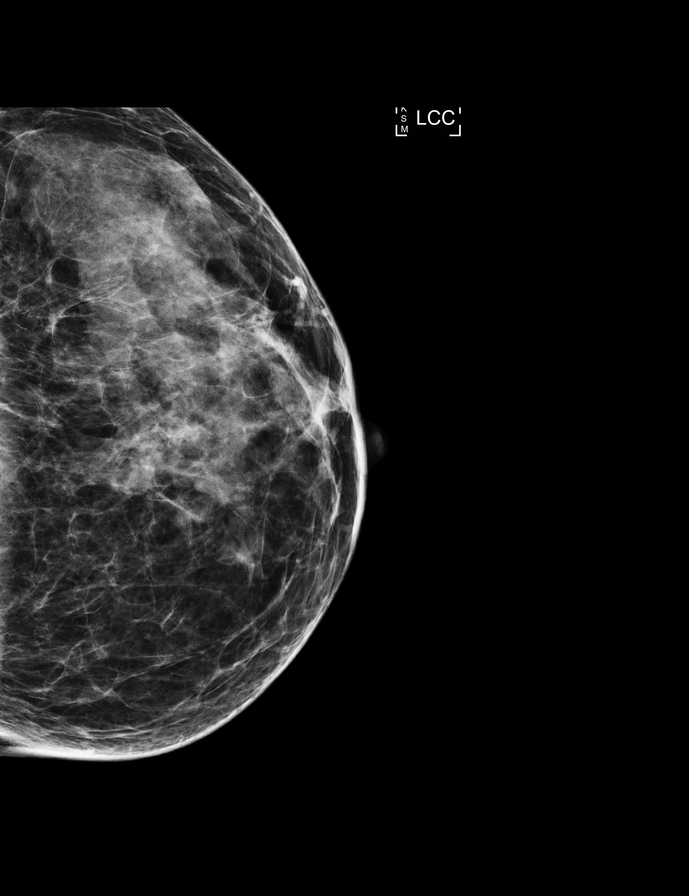

[L MLO]
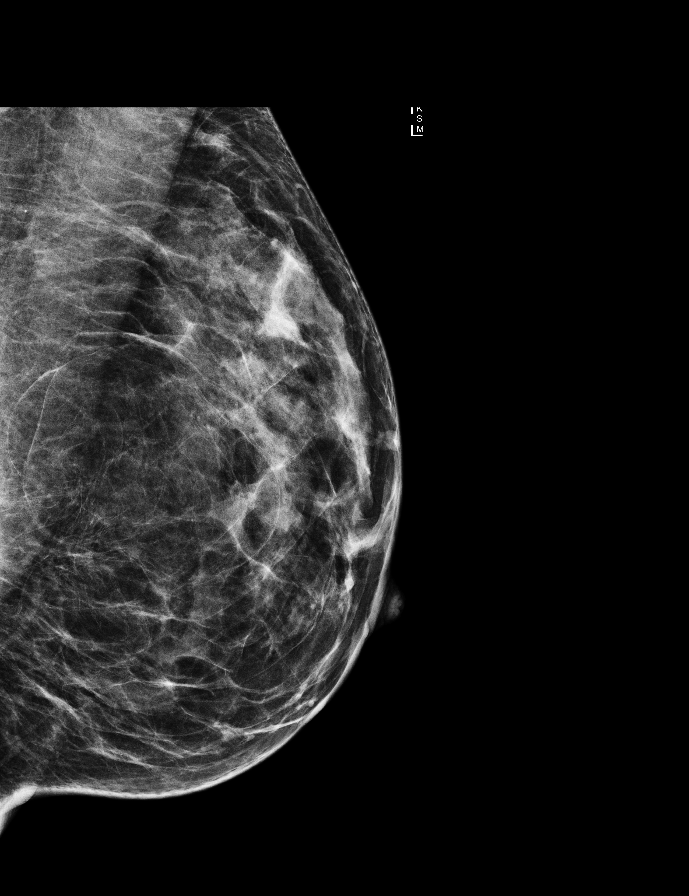

[L CC synth-2D]
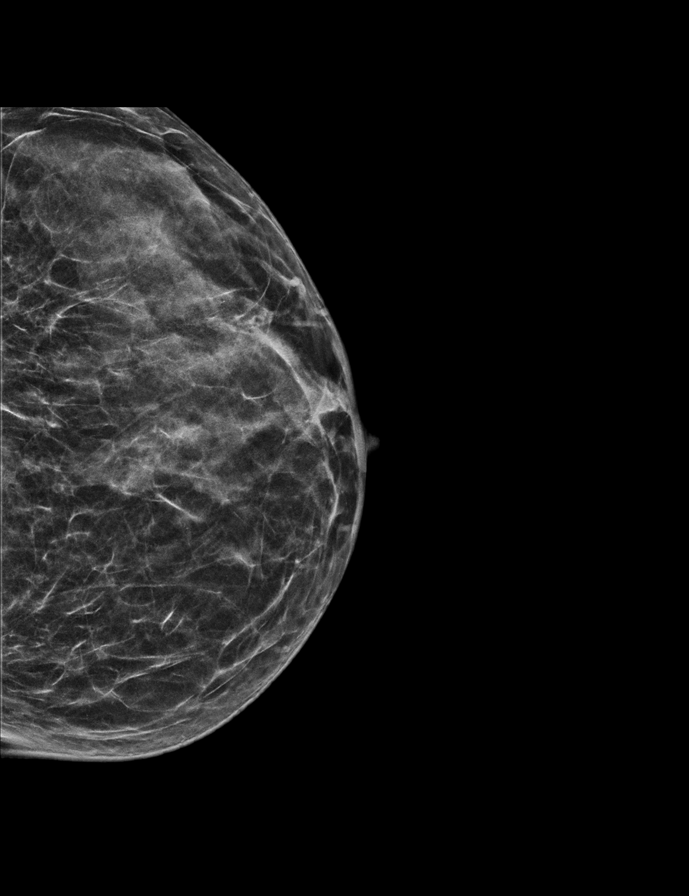

[R CC synth-2D]
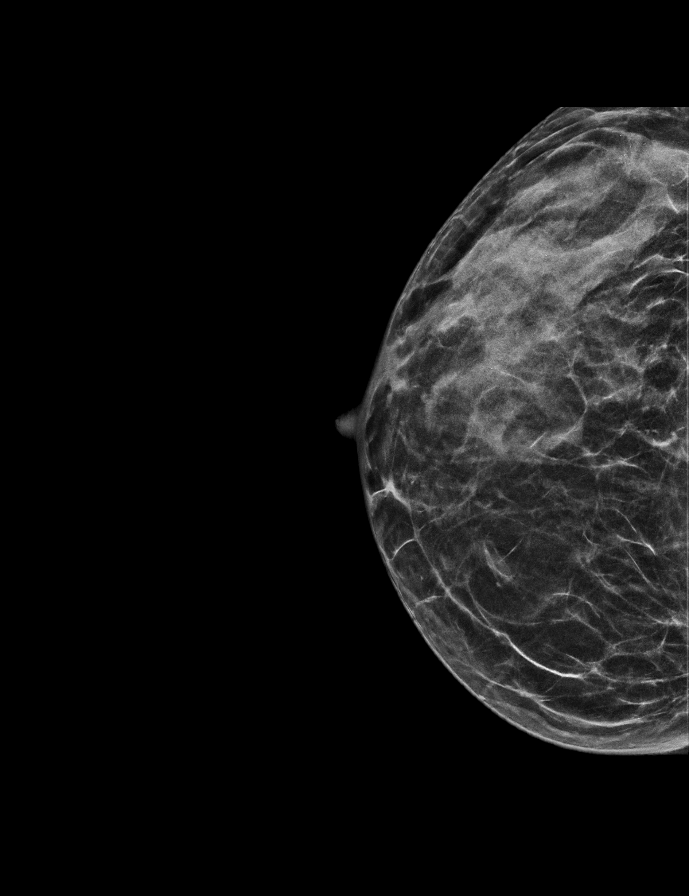

[R MLO]
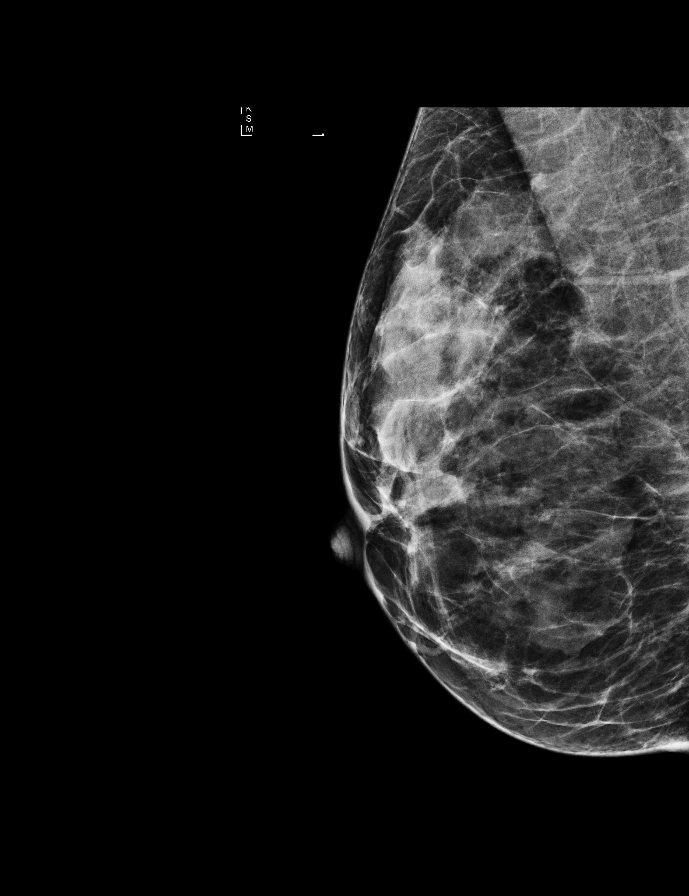

[R CC]
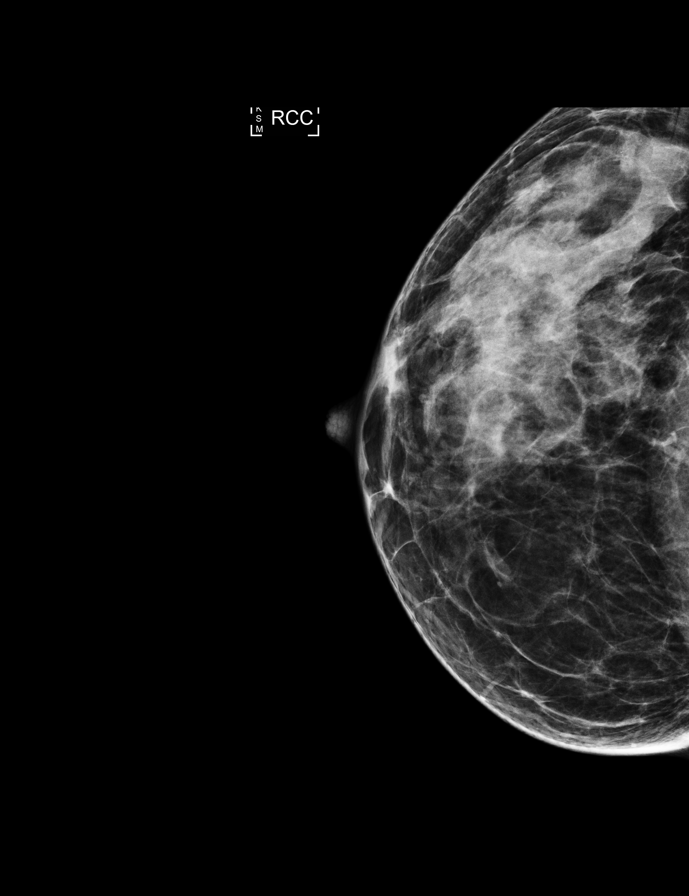

[R MLO synth-2D]
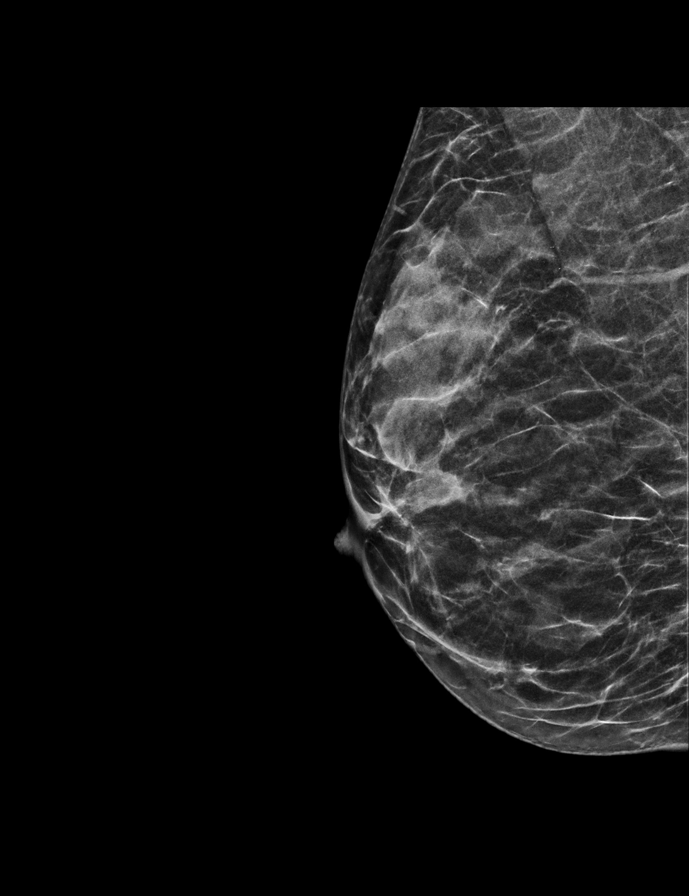

[L CC tomo · tomo slice 23/44.0]
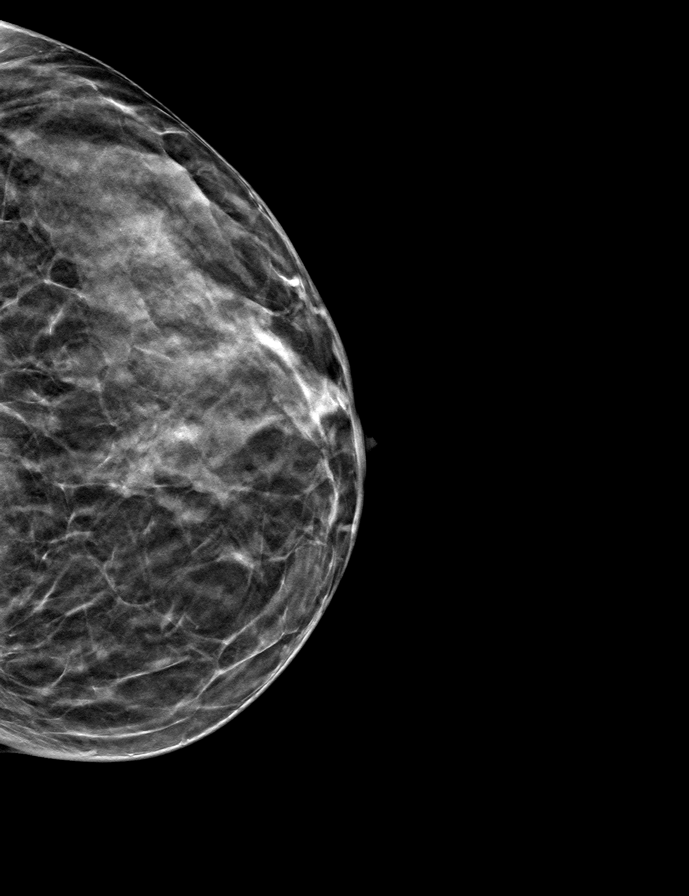

[9 of 28 positions shown; findings below may reference images not displayed]

ACR Breast Density Category c: The breast tissue is heterogeneously
dense, which may obscure small masses.
FINDINGS: There are no findings suspicious for malignancy. Images were
processed with CAD.
IMPRESSION: No mammographic evidence of malignancy. A result letter of this
screening mammogram will be mailed directly to the patient.

RECOMMENDATION:
Screening mammogram in one year. (Code:[TA])

BI-RADS CATEGORY  1: Negative.

## 2018-03-01 ENCOUNTER — Encounter: Payer: Self-pay | Admitting: Internal Medicine

## 2018-03-01 ENCOUNTER — Ambulatory Visit: Payer: BLUE CROSS/BLUE SHIELD | Admitting: Internal Medicine

## 2018-03-01 ENCOUNTER — Encounter

## 2018-03-01 VITALS — BP 112/62 | HR 67 | Temp 98.7°F | Ht 69.0 in | Wt 145.4 lb

## 2018-03-01 DIAGNOSIS — Z13220 Encounter for screening for lipoid disorders: Secondary | ICD-10-CM | POA: Diagnosis not present

## 2018-03-01 DIAGNOSIS — Z1329 Encounter for screening for other suspected endocrine disorder: Secondary | ICD-10-CM | POA: Diagnosis not present

## 2018-03-01 DIAGNOSIS — M25552 Pain in left hip: Secondary | ICD-10-CM | POA: Diagnosis not present

## 2018-03-01 DIAGNOSIS — Z0000 Encounter for general adult medical examination without abnormal findings: Secondary | ICD-10-CM | POA: Diagnosis not present

## 2018-03-01 DIAGNOSIS — Z1231 Encounter for screening mammogram for malignant neoplasm of breast: Secondary | ICD-10-CM | POA: Diagnosis not present

## 2018-03-01 DIAGNOSIS — Z1389 Encounter for screening for other disorder: Secondary | ICD-10-CM | POA: Diagnosis not present

## 2018-03-01 DIAGNOSIS — Z1159 Encounter for screening for other viral diseases: Secondary | ICD-10-CM | POA: Diagnosis not present

## 2018-03-01 DIAGNOSIS — M1990 Unspecified osteoarthritis, unspecified site: Secondary | ICD-10-CM

## 2018-03-01 DIAGNOSIS — M25551 Pain in right hip: Secondary | ICD-10-CM | POA: Diagnosis not present

## 2018-03-01 DIAGNOSIS — E559 Vitamin D deficiency, unspecified: Secondary | ICD-10-CM | POA: Diagnosis not present

## 2018-03-01 DIAGNOSIS — Z0184 Encounter for antibody response examination: Secondary | ICD-10-CM

## 2018-03-01 LAB — CBC WITH DIFFERENTIAL/PLATELET
Basophils Absolute: 0 10*3/uL (ref 0.0–0.1)
Basophils Relative: 0.5 % (ref 0.0–3.0)
Eosinophils Absolute: 0.1 10*3/uL (ref 0.0–0.7)
Eosinophils Relative: 1.1 % (ref 0.0–5.0)
HCT: 39.6 % (ref 36.0–46.0)
Hemoglobin: 13.1 g/dL (ref 12.0–15.0)
Lymphocytes Relative: 32.2 % (ref 12.0–46.0)
Lymphs Abs: 1.6 10*3/uL (ref 0.7–4.0)
MCHC: 33.1 g/dL (ref 30.0–36.0)
MCV: 95.6 fl (ref 78.0–100.0)
Monocytes Absolute: 0.3 10*3/uL (ref 0.1–1.0)
Monocytes Relative: 6.5 % (ref 3.0–12.0)
Neutro Abs: 3 10*3/uL (ref 1.4–7.7)
Neutrophils Relative %: 59.7 % (ref 43.0–77.0)
Platelets: 337 10*3/uL (ref 150.0–400.0)
RBC: 4.15 Mil/uL (ref 3.87–5.11)
RDW: 12.8 % (ref 11.5–15.5)
WBC: 5 10*3/uL (ref 4.0–10.5)

## 2018-03-01 LAB — VITAMIN D 25 HYDROXY (VIT D DEFICIENCY, FRACTURES): VITD: 24.83 ng/mL — ABNORMAL LOW (ref 30.00–100.00)

## 2018-03-01 LAB — COMPREHENSIVE METABOLIC PANEL
ALT: 8 U/L (ref 0–35)
AST: 13 U/L (ref 0–37)
Albumin: 4.2 g/dL (ref 3.5–5.2)
Alkaline Phosphatase: 40 U/L (ref 39–117)
BUN: 7 mg/dL (ref 6–23)
CO2: 30 mEq/L (ref 19–32)
Calcium: 9.3 mg/dL (ref 8.4–10.5)
Chloride: 102 mEq/L (ref 96–112)
Creatinine, Ser: 0.67 mg/dL (ref 0.40–1.20)
GFR: 99.54 mL/min (ref >60.00)
Glucose, Bld: 93 mg/dL (ref 70–99)
Potassium: 3.9 mEq/L (ref 3.5–5.1)
Sodium: 138 mEq/L (ref 135–145)
Total Bilirubin: 0.6 mg/dL (ref 0.2–1.2)
Total Protein: 6.6 g/dL (ref 6.0–8.3)

## 2018-03-01 LAB — LIPID PANEL
Cholesterol: 205 mg/dL — ABNORMAL HIGH (ref 0–200)
HDL: 76.9 mg/dL (ref >39.00)
LDL Cholesterol: 112 mg/dL — ABNORMAL HIGH (ref 0–99)
NonHDL: 127.62
Total CHOL/HDL Ratio: 3
Triglycerides: 78 mg/dL (ref 0.0–149.0)
VLDL: 15.6 mg/dL (ref 0.0–40.0)

## 2018-03-01 LAB — T4, FREE: Free T4: 0.69 ng/dL (ref 0.60–1.60)

## 2018-03-01 LAB — TSH: TSH: 1.23 u[IU]/mL (ref 0.35–4.50)

## 2018-03-01 NOTE — Progress Notes (Signed)
Chief Complaint  Patient presents with  . New Patient (Initial Visit)   New patient  1. C/o hip dysplasia and hip pain MRI 2013 reviewed with tendonosis femur and mild deg changes right hip. Hip pain prevents her from being intimate with husband  2. H/o HSIL/HPV/CIN 2, adenomyosis s/p hysterectomy with uterus and cervix out in 2008 she asks if pap smear needed      Review of Systems  Constitutional: Negative for weight loss.  HENT: Negative for hearing loss.   Eyes: Negative for blurred vision.  Respiratory: Negative for shortness of breath.   Cardiovascular: Negative for chest pain.  Gastrointestinal: Negative for abdominal pain.  Musculoskeletal: Positive for joint pain.  Skin: Negative for rash.  Neurological: Negative for headaches.  Psychiatric/Behavioral: Negative for depression.   Past Medical History:  Diagnosis Date  . Arthritis    Past Surgical History:  Procedure Laterality Date  . ABDOMINAL HYSTERECTOMY     2008 HPV HSIL/CIN 2 adenomyosis cervix and uterus out still has fallopian tubes   . SHOULDER SURGERY     right capsular plication 6720    Family History  Problem Relation Age of Onset  . Hyperlipidemia Mother   . Hypertension Mother   . Arthritis Father   . Cancer Father        neuroendocrine tumor small intestine to liver met  . COPD Father   . Hearing loss Father   . Depression Sister   . Diabetes Sister   . Seizures Sister   . Depression Brother   . Drug abuse Brother   . Stroke Maternal Grandmother   . Cancer Maternal Grandmother        testicular   . Early death Paternal Grandfather    Social History   Socioeconomic History  . Marital status: Married    Spouse name: Not on file  . Number of children: Not on file  . Years of education: Not on file  . Highest education level: Not on file  Occupational History  . Not on file  Social Needs  . Financial resource strain: Not on file  . Food insecurity:    Worry: Not on file   Inability: Not on file  . Transportation needs:    Medical: Not on file    Non-medical: Not on file  Tobacco Use  . Smoking status: Never Smoker  . Smokeless tobacco: Never Used  Substance and Sexual Activity  . Alcohol use: Yes  . Drug use: Not Currently  . Sexual activity: Yes  Lifestyle  . Physical activity:    Days per week: Not on file    Minutes per session: Not on file  . Stress: Not on file  Relationships  . Social connections:    Talks on phone: Not on file    Gets together: Not on file    Attends religious service: Not on file    Active member of club or organization: Not on file    Attends meetings of clubs or organizations: Not on file    Relationship status: Not on file  . Intimate partner violence:    Fear of current or ex partner: Not on file    Emotionally abused: Not on file    Physically abused: Not on file    Forced sexual activity: Not on file  Other Topics Concern  . Not on file  Social History Narrative   3 kids   Married    Owns guns, wears seat belt, safe in relationship  No outpatient medications have been marked as taking for the 03/01/18 encounter (Office Visit) with McLean-Scocuzza, Nino Glow, MD.   No Known Allergies No results found for this or any previous visit (from the past 2160 hour(s)). Objective  Body mass index is 21.47 kg/m. Wt Readings from Last 3 Encounters:  03/01/18 145 lb 6.4 oz (66 kg)   Temp Readings from Last 3 Encounters:  03/01/18 98.7 F (37.1 C) (Oral)   BP Readings from Last 3 Encounters:  03/01/18 112/62   Pulse Readings from Last 3 Encounters:  03/01/18 67    Physical Exam  Constitutional: She is oriented to person, place, and time. Vital signs are normal. She appears well-developed and well-nourished. She is cooperative.  HENT:  Head: Normocephalic and atraumatic.  Mouth/Throat: Oropharynx is clear and moist and mucous membranes are normal.  Eyes: Pupils are equal, round, and reactive to light.  Conjunctivae are normal.  Cardiovascular: Normal rate, regular rhythm and normal heart sounds.  Pulmonary/Chest: Effort normal and breath sounds normal.  Neurological: She is alert and oriented to person, place, and time. Gait normal.  Skin: Skin is warm, dry and intact.  Psychiatric: She has a normal mood and affect. Her speech is normal and behavior is normal. Judgment and thought content normal. Cognition and memory are normal.  Nursing note and vitals reviewed.   Assessment   1. Hip pain b/l with h/o dysplasia and arthritis  2. HM Plan   1. Refer to Dr. Roland Rack  Get prior labs to see if prior PCP checked for RA  2.  Had flu shot 2018  ? Tdap check prior records   Check prior pcp records labs (RA labs), vaccines, pap [facility]  Declines STD check today  Fasting labs today  Will consider pap in future h/o hysterectomy HPV 2008 HSIL/CIN 2 adenomyosis s/p uterus and cervix removed ovaries and fallopian tubes intact  -check prior pcp records on last pap done  mammo referred today Never seen dermatology FIL is Dr. Koleen Nimrod.   Get records Newborn ortho Dentist Dr. Eugenie Birks Prior PCP [facility] in [place]   Provider: Dr. Olivia Mackie McLean-Scocuzza-Internal Medicine

## 2018-03-01 NOTE — Patient Instructions (Addendum)
Please f/u in 3-4 months  We will let you know about pap smear and lab results  Please sch your mammogram  Take care  We will refer you to Dr. Joice LoftsPoggi   Arthritis Arthritis is a term that is commonly used to refer to joint pain or joint disease. There are more than 100 types of arthritis. What are the causes? The most common cause of this condition is wear and tear of a joint. Other causes include:  Gout.  Inflammation of a joint.  An infection of a joint.  Sprains and other injuries near the joint.  A drug reaction or allergic reaction.  In some cases, the cause may not be known. What are the signs or symptoms? The main symptom of this condition is pain in the joint with movement. Other symptoms include:  Redness, swelling, or stiffness at a joint.  Warmth coming from the joint.  Fever.  Overall feeling of illness.  How is this diagnosed? This condition may be diagnosed with a physical exam and tests, including:  Blood tests.  Urine tests.  Imaging tests, such as MRI, X-rays, or a CT scan.  Sometimes, fluid is removed from a joint for testing. How is this treated? Treatment for this condition may involve:  Treatment of the cause, if it is known.  Rest.  Raising (elevating) the joint.  Applying cold or hot packs to the joint.  Medicines to improve symptoms and reduce inflammation.  Injections of a steroid such as cortisone into the joint to help reduce pain and inflammation.  Depending on the cause of your arthritis, you may need to make lifestyle changes to reduce stress on your joint. These changes may include exercising more and losing weight. Follow these instructions at home: Medicines  Take over-the-counter and prescription medicines only as told by your health care provider.  Do not take aspirin to relieve pain if gout is suspected. Activity  Rest your joint if told by your health care provider. Rest is important when your disease is active and  your joint feels painful, swollen, or stiff.  Avoid activities that make the pain worse. It is important to balance activity with rest.  Exercise your joint regularly with range-of-motion exercises as told by your health care provider. Try doing low-impact exercise, such as: ? Swimming. ? Water aerobics. ? Biking. ? Walking. Joint Care   If your joint is swollen, keep it elevated if told by your health care provider.  If your joint feels stiff in the morning, try taking a warm shower.  If directed, apply heat to the joint. If you have diabetes, do not apply heat without permission from your health care provider. ? Put a towel between the joint and the hot pack or heating pad. ? Leave the heat on the area for 20-30 minutes.  If directed, apply ice to the joint: ? Put ice in a plastic bag. ? Place a towel between your skin and the bag. ? Leave the ice on for 20 minutes, 2-3 times per day.  Keep all follow-up visits as told by your health care provider. This is important. Contact a health care provider if:  The pain gets worse.  You have a fever. Get help right away if:  You develop severe joint pain, swelling, or redness.  Many joints become painful and swollen.  You develop severe back pain.  You develop severe weakness in your leg.  You cannot control your bladder or bowels. This information is not intended to  replace advice given to you by your health care provider. Make sure you discuss any questions you have with your health care provider. Document Released: 10/15/2004 Document Revised: 02/13/2016 Document Reviewed: 12/03/2014 Elsevier Interactive Patient Education  [employer].

## 2018-03-01 NOTE — Progress Notes (Signed)
Pre visit review using our clinic review tool, if applicable. No additional management support is needed unless otherwise documented below in the visit note. 

## 2018-03-02 LAB — HEPATITIS B SURFACE ANTIBODY, QUANTITATIVE: Hepatitis B-Post: <5 m[IU]/mL — ABNORMAL LOW (ref >=10)

## 2018-03-02 LAB — URINALYSIS, ROUTINE W REFLEX MICROSCOPIC
Bilirubin, UA: NEGATIVE
Glucose, UA: NEGATIVE
Ketones, UA: NEGATIVE
Leukocytes, UA: NEGATIVE
Nitrite, UA: NEGATIVE
Protein, UA: NEGATIVE
RBC, UA: NEGATIVE
Specific Gravity, UA: 1.014 (ref 1.005–1.030)
Urobilinogen, Ur: 0.2 mg/dL (ref 0.2–1.0)
pH, UA: 7 (ref 5.0–7.5)

## 2018-03-02 LAB — MEASLES/MUMPS/RUBELLA IMMUNITY
Mumps IgG: 74.3 AU/mL
Rubella: 1.39 index
Rubeola IgG: 96.4 AU/mL

## 2018-03-29 ENCOUNTER — Ambulatory Visit
Admission: RE | Admit: 2018-03-29 | Discharge: 2018-03-29 | Disposition: A | Discharge status: Home / Self Care | Payer: BLUE CROSS/BLUE SHIELD | Source: Ambulatory Visit | Attending: Internal Medicine | Admitting: Internal Medicine

## 2018-03-29 DIAGNOSIS — Z1231 Encounter for screening mammogram for malignant neoplasm of breast: Secondary | ICD-10-CM | POA: Diagnosis not present

## 2018-03-29 IMAGING — MG MM DIGITAL SCREENING BILAT W/ TOMO W/ CAD
6 of 10 series · 6 of 30 positions shown · non-contrast
Comparison: Previous exam(s).

CLINICAL DATA: Screening.

EXAM:
DIGITAL SCREENING BILATERAL MAMMOGRAM WITH TOMO AND CAD

[R MLO synth-2D (1 of 2)]
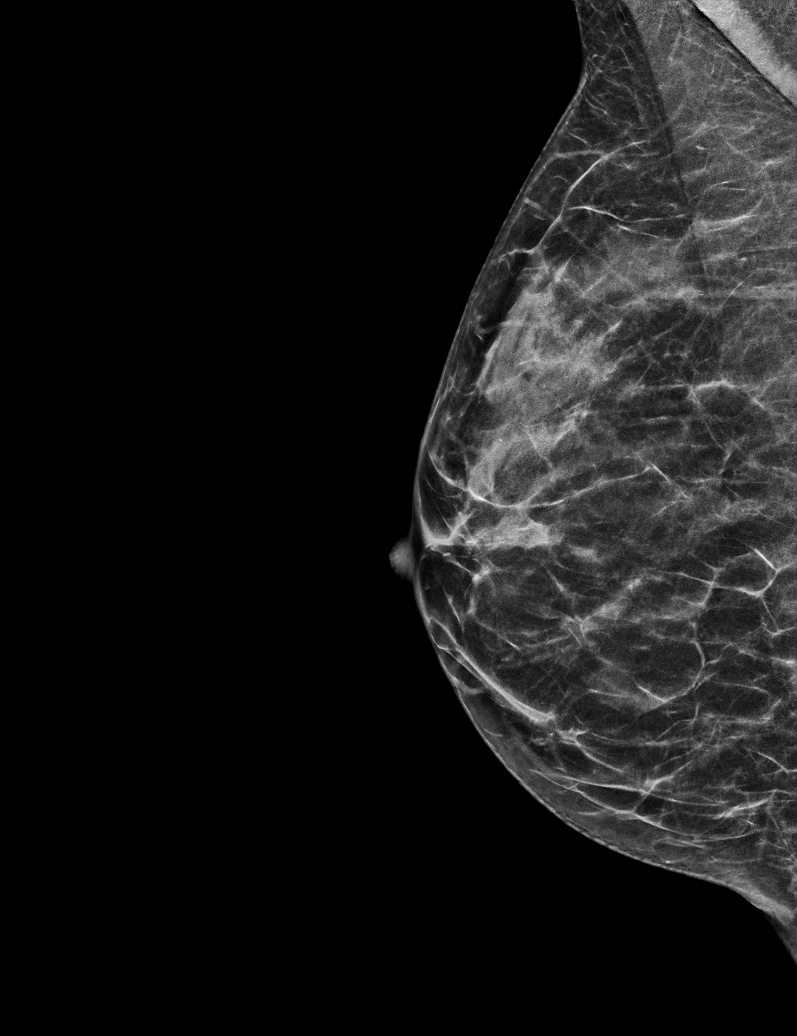

[R MLO synth-2D (2 of 2)]
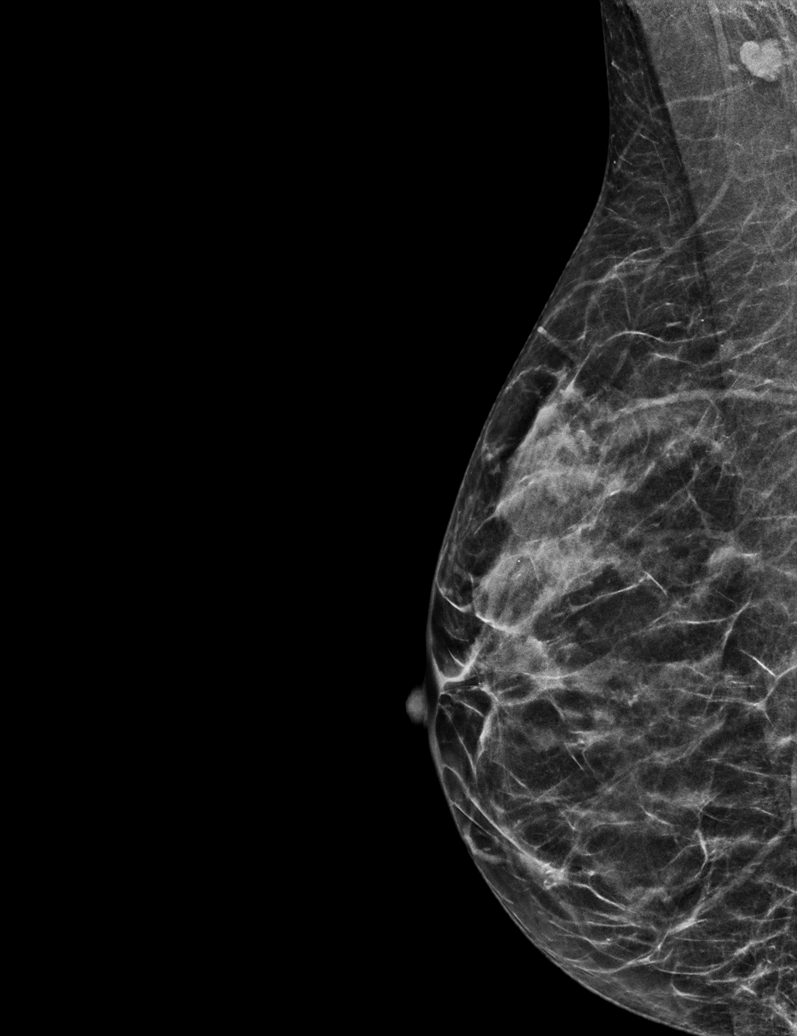

[L CC synth-2D]
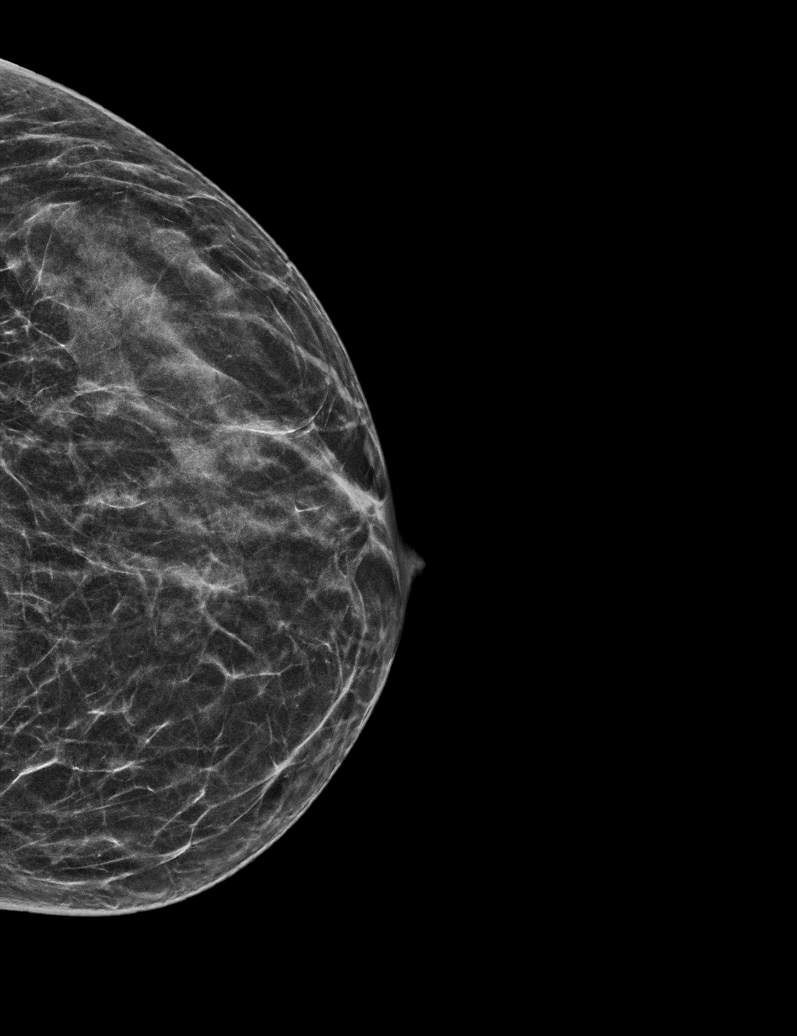

[R CC synth-2D]
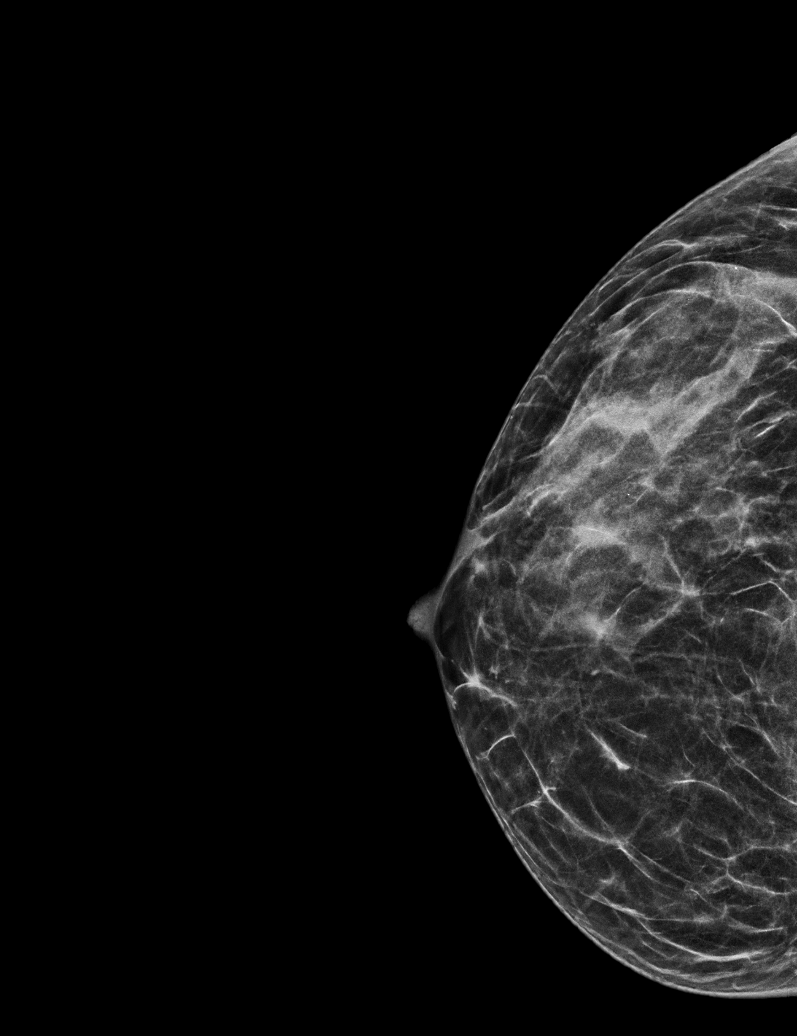

[L MLO synth-2D]
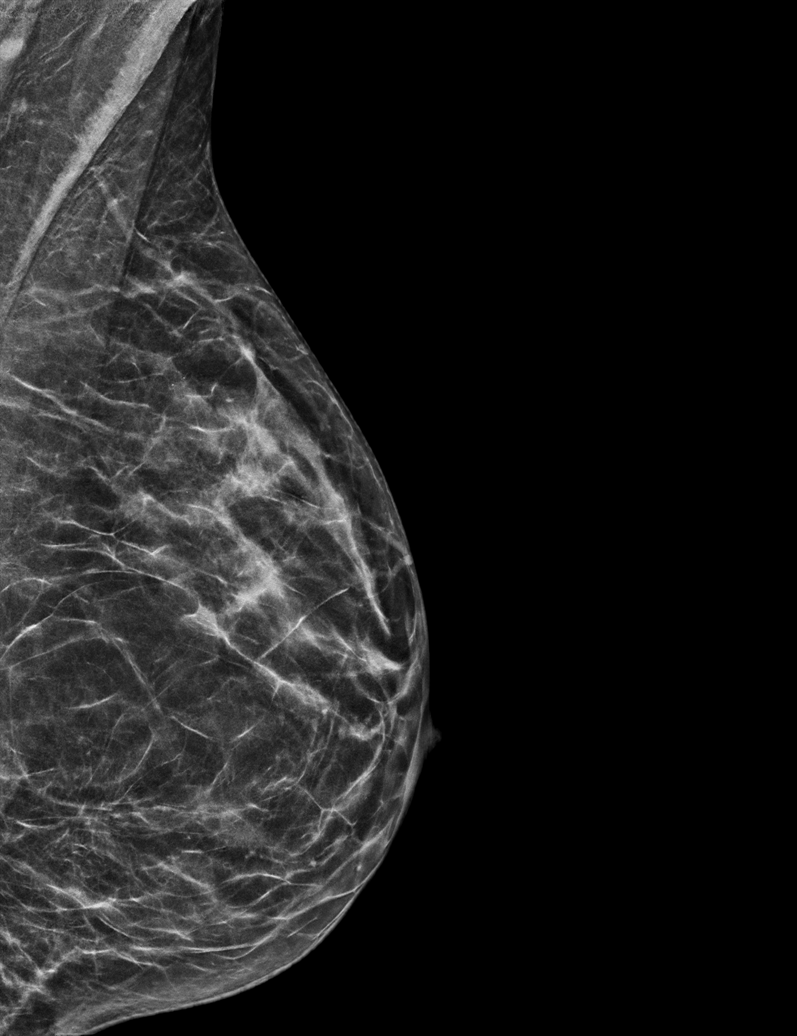

[R CC tomo · tomo slice 21/41.0]
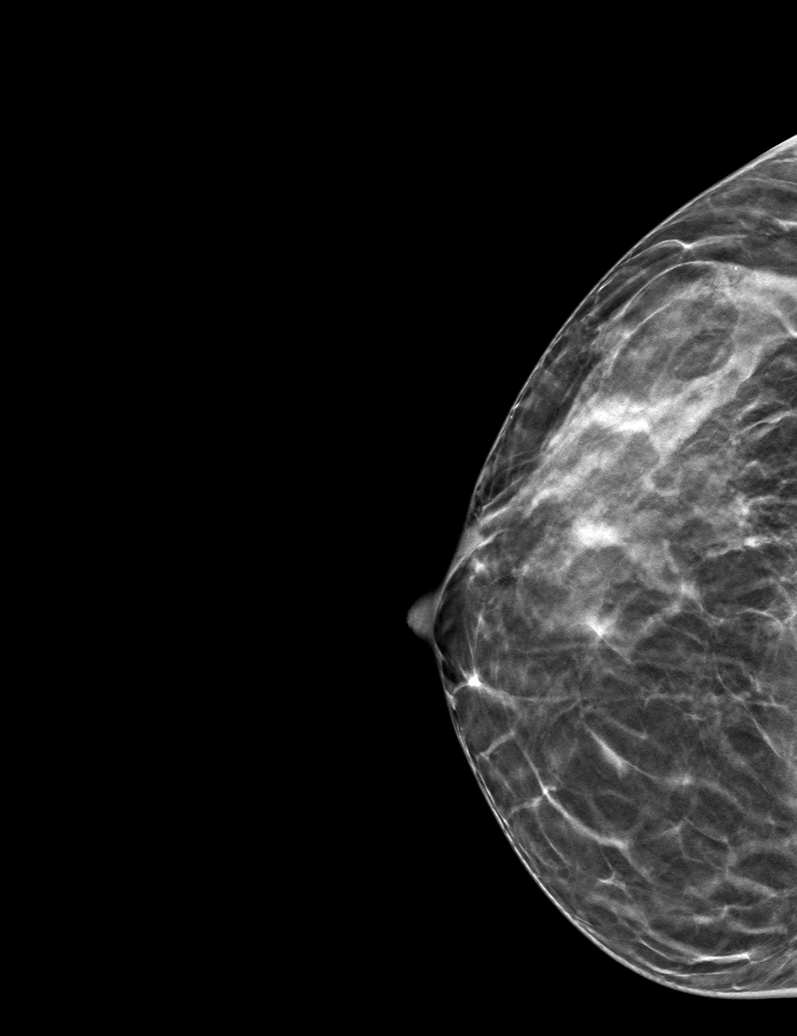

[6 of 30 positions shown; findings below may reference images not displayed]

ACR Breast Density Category c: The breast tissue is heterogeneously
dense, which may obscure small masses.
FINDINGS: There are no findings suspicious for malignancy. Images were
processed with CAD.
IMPRESSION: No mammographic evidence of malignancy. A result letter of this
screening mammogram will be mailed directly to the patient.

RECOMMENDATION:
Screening mammogram in one year. (Code:[5V])

BI-RADS CATEGORY  1: Negative.

## 2018-06-01 ENCOUNTER — Ambulatory Visit: Payer: BLUE CROSS/BLUE SHIELD | Admitting: Internal Medicine

## 2018-06-01 ENCOUNTER — Encounter: Payer: Self-pay | Admitting: Internal Medicine

## 2018-06-01 ENCOUNTER — Other Ambulatory Visit (HOSPITAL_COMMUNITY)
Admission: RE | Admit: 2018-06-01 | Discharge: 2018-06-01 | Disposition: A | Discharge status: Home / Self Care | Payer: BLUE CROSS/BLUE SHIELD | Source: Ambulatory Visit | Attending: Internal Medicine | Admitting: Internal Medicine

## 2018-06-01 VITALS — BP 122/82 | HR 71 | Temp 98.2°F | Resp 17 | Ht 68.25 in | Wt 145.5 lb

## 2018-06-01 DIAGNOSIS — Z23 Encounter for immunization: Secondary | ICD-10-CM | POA: Diagnosis not present

## 2018-06-01 DIAGNOSIS — Z0000 Encounter for general adult medical examination without abnormal findings: Secondary | ICD-10-CM | POA: Diagnosis not present

## 2018-06-01 DIAGNOSIS — Z124 Encounter for screening for malignant neoplasm of cervix: Secondary | ICD-10-CM | POA: Insufficient documentation

## 2018-06-01 DIAGNOSIS — E785 Hyperlipidemia, unspecified: Secondary | ICD-10-CM

## 2018-06-01 DIAGNOSIS — E559 Vitamin D deficiency, unspecified: Secondary | ICD-10-CM | POA: Diagnosis not present

## 2018-06-01 DIAGNOSIS — Z1231 Encounter for screening mammogram for malignant neoplasm of breast: Secondary | ICD-10-CM

## 2018-06-01 NOTE — Patient Instructions (Addendum)
Results for Kaitlyn Kane, Kaitlyn Kane (MRN [id number]) as of 06/01/2018 12:07  Ref. Range 03/01/2018 12:31  Hepatitis B-Post Latest Ref Range: > OR = 10 mIU/mL <5 (L)   You were given Tdap today   Dr. Lyda Kalata Duke    DTaP Vaccine (Diphtheria, Tetanus, and Pertussis): What You Need to Know 1. Why get vaccinated? Diphtheria, tetanus, and pertussis are serious diseases caused by bacteria. Diphtheria and pertussis are spread from person to person. Tetanus enters the body through cuts or wounds. DIPHTHERIA causes a thick covering in the back of the throat.  It can lead to breathing problems, paralysis, heart failure, and even death.  TETANUS (Lockjaw) causes painful tightening of the muscles, usually all over the body.  It can lead to "locking" of the jaw so the victim cannot open his mouth or swallow. Tetanus leads to death in up to 2 out of 10 cases.  PERTUSSIS (Whooping Cough) causes coughing spells so bad that it is hard for infants to eat, drink, or breathe. These spells can last for weeks.  It can lead to pneumonia, seizures (jerking and staring spells), brain damage, and death.  Diphtheria, tetanus, and pertussis vaccine (DTaP) can help prevent these diseases. Most children who are vaccinated with DTaP will be protected throughout childhood. Many more children would get these diseases if we stopped vaccinating. DTaP is a safer version of an older vaccine called DTP. DTP is no longer used in the [place]. 2. Who should get DTaP vaccine and when? Children should get 5 doses of DTaP vaccine, one dose at each of the following ages:  2 months  4 months  6 months  15-18 months  4-6 years  DTaP may be given at the same time as other vaccines. 3. Some children should not get DTaP vaccine or should wait  Children with minor illnesses, such as a cold, may be vaccinated. But children who are moderately or severely ill should usually wait until they recover before getting DTaP  vaccine.  Any child who had a life-threatening allergic reaction after a dose of DTaP should not get another dose.  Any child who suffered a brain or nervous system disease within 7 days after a dose of DTaP should not get another dose.  Talk with your doctor if your child: ? had a seizure or collapsed after a dose of DTaP, ? cried non-stop for 3 hours or more after a dose of DTaP, ? had a fever over 105F after a dose of DTaP. Ask your doctor for more information. Some of these children should not get another dose of pertussis vaccine, but may get a vaccine without pertussis, called DT. 4. Older children and adults DTaP is not licensed for adolescents, adults, or children 30 years of age and older. But older people still need protection. A vaccine called Tdap is similar to DTaP. A single dose of Tdap is recommended for people 11 through 49 years of age. Another vaccine, called Td, protects against tetanus and diphtheria, but not pertussis. It is recommended every 10 years. There are separate Vaccine Information Statements for these vaccines. 5. What are the risks from DTaP vaccine? Getting diphtheria, tetanus, or pertussis disease is much riskier than getting DTaP vaccine. However, a vaccine, like any medicine, is capable of causing serious problems, such as severe allergic reactions. The risk of DTaP vaccine causing serious harm, or death, is extremely small. Mild problems (common)  Fever (up to about 1 child in 4)  Redness or swelling  where the shot was given (up to about 1 child in 4)  Soreness or tenderness where the shot was given (up to about 1 child in 4) These problems occur more often after the 4th and 5th doses of the DTaP series than after earlier doses. Sometimes the 4th or 5th dose of DTaP vaccine is followed by swelling of the entire arm or leg in which the shot was given, lasting 1-7 days (up to about 1 child in 30). Other mild problems include:  Fussiness (up to about 1  child in 3)  Tiredness or poor appetite (up to about 1 child in 10)  Vomiting (up to about 1 child in 50) These problems generally occur 1-3 days after the shot. Moderate problems (uncommon)  Seizure (jerking or staring) (about 1 child out of 14,000)  Non-stop crying, for 3 hours or more (up to about 1 child out of 1,000)  High fever, over 105F (about 1 child out of 16,000) Severe problems (very rare)  Serious allergic reaction (less than 1 out of a million doses)  Several other severe problems have been reported after DTaP vaccine. These include: ? Long-term seizures, coma, or lowered consciousness ? Permanent brain damage. These are so rare it is hard to tell if they are caused by the vaccine. Controlling fever is especially important for children who have had seizures, for any reason. It is also important if another family member has had seizures. You can reduce fever and pain by giving your child an aspirin-free pain reliever when the shot is given, and for the next 24 hours, following the package instructions. 6. What if there is a serious reaction? What should I look for? Look for anything that concerns you, such as signs of a severe allergic reaction, very high fever, or behavior changes. Signs of a severe allergic reaction can include hives, swelling of the face and throat, difficulty breathing, a fast heartbeat, dizziness, and weakness. These would start a few minutes to a few hours after the vaccination. What should I do?  If you think it is a severe allergic reaction or other emergency that can't wait, call 9-1-1 or get the person to the nearest hospital. Otherwise, call your doctor.  Afterward, the reaction should be reported to the Vaccine Adverse Event Reporting System (VAERS). Your doctor might file this report, or you can do it yourself through the VAERS web site at www.vaers.LAgents.no, or by calling 1-805-872-1189. ? VAERS is only for reporting reactions. They do not  give medical advice. 7. The National Vaccine Injury Compensation Program The [employer] Vaccine Injury Compensation Program (VICP) is a federal program that was created to compensate people who may have been injured by certain vaccines. Persons who believe they may have been injured by a vaccine can learn about the program and about filing a claim by calling 1-(513)553-2480 or visiting the VICP website at SpiritualWord.at. 8. How can I learn more?  Ask your doctor.  Call your local or state health department.  Contact the Centers for Disease Control and Prevention (CDC): ? Call (934) 594-8838 (1-800-CDC-INFO) or ? Visit CDC's website at PicCapture.uy CDC DTaP Vaccine (Diphtheria, Tetanus, and Pertussis) VIS (02/04/06) This information is not intended to replace advice given to you by your health care provider. Make sure you discuss any questions you have with your health care provider. Document Released: 07/05/2006 Document Revised: 05/28/2016 Document Reviewed: 05/28/2016 Elsevier Interactive Patient Education  2017 [employer].  Think about hep B vaccine  Hepatitis B Vaccine, Recombinant  injection What is this medicine? HEPATITIS B VACCINE (hep uh TAHY tis B VAK seen) is a vaccine. It is used to prevent an infection with the hepatitis B virus. This medicine may be used for other purposes; ask your health care provider or pharmacist if you have questions. COMMON BRAND NAME(S): Engerix-B, Recombivax HB What should I tell my health care provider before I take this medicine? They need to know if you have any of these conditions: -fever, infection -heart disease -hepatitis B infection -immune system problems -kidney disease -an unusual or allergic reaction to vaccines, yeast, other medicines, foods, dyes, or preservatives -pregnant or trying to get pregnant -breast-feeding How should I use this medicine? This vaccine is for injection into a muscle. It is given by  a health care professional. A copy of Vaccine Information Statements will be given before each vaccination. Read this sheet carefully each time. The sheet may change frequently. Talk to your pediatrician regarding the use of this medicine in children. While this drug may be prescribed for children as young as newborn for selected conditions, precautions do apply. Overdosage: If you think you have taken too much of this medicine contact a poison control center or emergency room at once. NOTE: This medicine is only for you. Do not share this medicine with others. What if I miss a dose? It is important not to miss your dose. Call your doctor or health care professional if you are unable to keep an appointment. What may interact with this medicine? -medicines that suppress your immune function like adalimumab, anakinra, infliximab -medicines to treat cancer -steroid medicines like prednisone or cortisone This list may not describe all possible interactions. Give your health care provider a list of all the medicines, herbs, non-prescription drugs, or dietary supplements you use. Also tell them if you smoke, drink alcohol, or use illegal drugs. Some items may interact with your medicine. What should I watch for while using this medicine? See your health care provider for all shots of this vaccine as directed. You must have 3 shots of this vaccine for protection from hepatitis B infection. Tell your doctor right away if you have any serious or unusual side effects after getting this vaccine. What side effects may I notice from receiving this medicine? Side effects that you should report to your doctor or health care professional as soon as possible: -allergic reactions like skin rash, itching or hives, swelling of the face, lips, or tongue -breathing problems -confused, irritated -fast, irregular heartbeat -flu-like syndrome -numb, tingling pain -seizures -unusually weak or tired Side effects that  usually do not require medical attention (report to your doctor or health care professional if they continue or are bothersome): -diarrhea -fever -headache -loss of appetite -muscle pain -nausea -pain, redness, swelling, or irritation at site where injected -tiredness This list may not describe all possible side effects. Call your doctor for medical advice about side effects. You may report side effects to FDA at 1-800-FDA-1088. Where should I keep my medicine? This drug is given in a hospital or clinic and will not be stored at home. NOTE: This sheet is a summary. It may not cover all possible information. If you have questions about this medicine, talk to your doctor, pharmacist, or health care provider.  2018 Elsevier/Gold Standard (2014-01-08 13:26:01)

## 2018-06-01 NOTE — Progress Notes (Signed)
Pre visit review using our clinic review tool, if applicable. No additional management support is needed unless otherwise documented below in the visit note. 

## 2018-06-01 NOTE — Progress Notes (Signed)
Chief Complaint  Patient presents with  . Follow-up  . Gynecologic Exam    PAP   Annual Physical no complaints  1. Reviewed labs vit D low taking D3 5000 IU daily  2. HLD mildly elevated changed diet  3. Hip dysplasia still c/o hip pain interested in STEM Cell will do research to see who does this disc Dr. Amado Coe today has seen in past and he rec PAO surgery vs arthroscopy and pt did not want to do invasive surgery at the time.   Review of Systems  Constitutional: Negative for weight loss.  HENT: Negative for hearing loss.   Eyes: Negative for blurred vision.  Respiratory: Negative for shortness of breath.   Cardiovascular: Negative for chest pain.  Gastrointestinal: Negative for abdominal pain.  Musculoskeletal: Positive for joint pain.  Skin: Negative for rash.  Neurological: Negative for headaches.  Psychiatric/Behavioral: Negative for depression.   Past Medical History:  Diagnosis Date  . Arthritis    Past Surgical History:  Procedure Laterality Date  . 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 SHOULDER SURGERY     right capsular plication 7564    Family History  Problem Relation Age of Onset  . Hyperlipidemia Mother   . Hypertension Mother   . Arthritis Father   . Cancer Father        neuroendocrine tumor small intestine to liver met  . COPD Father   . Hearing loss Father   . Depression Sister   . Diabetes Sister   . Seizures Sister   . Depression Brother   . Drug abuse Brother   . Stroke Maternal Grandmother   . Cancer Maternal Grandmother        testicular   . Early death Paternal Grandfather    Social History   Socioeconomic History  . Marital status: Married    Spouse name: Not on file  . Number of children: Not on file  . Years of education: Not on file  . Highest education level: Not on file  Occupational History  . Not on file  Social Needs  . Financial  resource strain: Not on file  . Food insecurity:    Worry: Not on file    Inability: Not on file  . Transportation needs:    Medical: Not on file    Non-medical: Not on file  Tobacco Use  . Smoking status: Never Smoker  . Smokeless tobacco: Never Used  Substance and Sexual Activity  . Alcohol use: Yes  . Drug use: Not Currently  . Sexual activity: Yes  Lifestyle  . Physical activity:    Days per week: Not on file    Minutes per session: Not on file  . Stress: Not on file  Relationships  . Social connections:    Talks on phone: Not on file    Gets together: Not on file    Attends religious service: Not on file    Active member of club or organization: Not on file    Attends meetings of clubs or organizations: Not on file    Relationship status: Not on file  . Intimate partner violence:    Fear of current or ex partner: Not on file    Emotionally abused: Not on file    Physically abused: Not on file    Forced sexual activity: Not on file  Other Topics Concern  . 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 Cholecalciferol (VITAMIN D-3) 5000 units TABS Take by mouth.   No Known Allergies No results found for this or any previous visit (from the past 2160 hour(s)). Objective  Body mass index is 21.96 kg/m. Wt Readings from Last 3 Encounters:  06/01/18 145 lb 8 oz (66 kg)  03/01/18 145 lb 6.4 oz (66 kg)   Temp Readings from Last 3 Encounters:  06/01/18 98.2 F (36.8 C) (Oral)  03/01/18 98.7 F (37.1 C) (Oral)   BP Readings from Last 3 Encounters:  06/01/18 122/82  03/01/18 112/62   Pulse Readings from Last 3 Encounters:  06/01/18 71  03/01/18 67    Physical Exam  Constitutional: She is oriented to person, place, and time. Vital signs are normal. She appears well-developed and well-nourished. She is cooperative.  HENT:  Head:  Normocephalic and atraumatic.  Mouth/Throat: Oropharynx is clear and moist and mucous membranes are normal.  Eyes: Pupils are equal, round, and reactive to light. Conjunctivae are normal.  Cardiovascular: Normal rate, regular rhythm and normal heart sounds.  Pulmonary/Chest: Effort normal and breath sounds normal.  Genitourinary: Vagina normal and uterus normal. Pelvic exam was performed with patient supine. There is no rash on the right labia. There is no rash on the left labia. Cervix exhibits discharge. Right adnexum displays no mass, no tenderness and no fullness. Left adnexum displays no mass, no tenderness and no fullness.  Genitourinary Comments: White discharge  Surgically absent cervix with small papule left vaginal wall 0.3 cm Absent uterus    Neurological: She is alert and oriented to person, place, and time. Gait normal.  Skin: Skin is warm, dry and intact.  Psychiatric: She has a normal mood and affect. Her speech is normal and behavior is normal. Judgment and thought content normal. Cognition and memory are normal.  Nursing note and vitals reviewed.   Assessment   1. Annual physical  2. HLD  3. Hip dysplasia   Plan  1.  Will get flu shot elsewhere 2019  MMR immune  Tdap today Disc hep B vaccine future   Reviewed labs declines STD check  Reviewed mammogram 03/29/18 neg  Pap today surgically absent uterus and cervix mild discharge s/p hysterectomy cin 2 07/2007 with ovaries and FT intact  Disc colonoscopy due age 66 y.o  Dermatology FIL Dr. Koleen Nimrod (retired dermatologist) he checks her skin   2. Disc healthy diet choices  3. Did not see Dr. Roland Rack pt intersted in Stem cell transplant procedure she will do research  Disc Dr Amado Coe [facility] she has seen in the past   Provider: Dr. Olivia Mackie McLean-Scocuzza-[facility]

## 2018-06-02 LAB — CYTOLOGY - PAP
Candida vaginitis: NEGATIVE
Diagnosis: NEGATIVE
HPV: NOT DETECTED

## 2018-07-11 DIAGNOSIS — M162 Bilateral osteoarthritis resulting from hip dysplasia: Secondary | ICD-10-CM | POA: Insufficient documentation

## 2018-07-11 DIAGNOSIS — M47816 Spondylosis without myelopathy or radiculopathy, lumbar region: Secondary | ICD-10-CM | POA: Insufficient documentation

## 2019-07-31 ENCOUNTER — Other Ambulatory Visit: Payer: Self-pay

## 2019-07-31 DIAGNOSIS — Z20822 Contact with and (suspected) exposure to covid-19: Secondary | ICD-10-CM

## 2019-08-01 LAB — NOVEL CORONAVIRUS, NAA: SARS-CoV-2, NAA: NOT DETECTED

## 2019-09-04 ENCOUNTER — Ambulatory Visit (INDEPENDENT_AMBULATORY_CARE_PROVIDER_SITE_OTHER): Payer: BC Managed Care – PPO | Admitting: Internal Medicine

## 2019-09-04 ENCOUNTER — Encounter: Payer: Self-pay | Admitting: Internal Medicine

## 2019-09-04 ENCOUNTER — Other Ambulatory Visit: Payer: Self-pay

## 2019-09-04 DIAGNOSIS — U071 COVID-19: Secondary | ICD-10-CM

## 2019-09-04 MED ORDER — AZITHROMYCIN 500 MG PO TABS: 500.0000 mg | ORAL_TABLET | Freq: Every day | ORAL | 0 refills | Status: DC

## 2019-09-04 MED ORDER — ALBUTEROL SULFATE HFA 108 (90 BASE) MCG/ACT IN AERS: 2.0000 | INHALATION_SPRAY | Freq: Four times a day (QID) | RESPIRATORY_TRACT | 1 refills | Status: DC | PRN

## 2019-09-04 MED ORDER — HYDROCOD POLST-CPM POLST ER 10-8 MG/5ML PO SUER: 5.0000 mL | Freq: Every evening | ORAL | 0 refills | Status: DC | PRN

## 2019-09-04 MED ORDER — BENZONATATE 200 MG PO CAPS: 200.0000 mg | ORAL_CAPSULE | Freq: Three times a day (TID) | ORAL | 1 refills | Status: DC | PRN

## 2019-09-04 MED ORDER — PREDNISONE 10 MG PO TABS: ORAL_TABLET | ORAL | 0 refills | Status: DC

## 2019-09-04 NOTE — Progress Notes (Signed)
Virtual Visit via [email or web address]  This visit type was conducted due to national recommendations for restrictions regarding the COVID-19 pandemic (e.g. social distancing).  This format is felt to be most appropriate for this patient at this time.  All issues noted in this document were discussed and addressed.  No physical exam was performed (except for noted visual exam findings with Video Visits).   I connected with@ on 09/04/19 at 11:00 AM EST by a video enabled telemedicine application and verified that I am speaking with the correct person using two identifiers. Location patient: home Location provider: work or home office Persons participating in the virtual visit: patient, provider  I discussed the limitations, risks, security and privacy concerns of performing an evaluation and management service by telephone and the availability of in person appointments. I also discussed with the patient that there may be a patient responsible charge related to this service. The patient expressed understanding and agreed to proceed.  Reason for visit: COVID 19 INFECTION  HPI:  48 yr old female presents with cough,  shortness of breath and new onset  fevers after testing positive for COVID 19  ON DEC 6. Patient became symptomatic on dec 4th after being exposed to her mother Erskine Speed, who is currently hospitalized at [facility] and not expected to survive. patient started having fevers on  Dec 11 . Cough is still non productive,  And she has chest tightness with deep breathing    ROS: See pertinent positives and negatives per HPI.  Past Medical History:  Diagnosis Date  . Arthritis     Past Surgical History:  Procedure Laterality Date  . 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 SHOULDER SURGERY     right capsular plication 1638     Family History  Problem Relation Age of Onset  . Hyperlipidemia Mother   .  Hypertension Mother   . Arthritis Father   . Cancer Father        neuroendocrine tumor small intestine to liver met  . COPD Father   . Hearing loss Father   . Depression Sister   . Diabetes Sister   . Seizures Sister   . Depression Brother   . Drug abuse Brother   . Stroke Maternal Grandmother   . Cancer Maternal Grandmother        testicular   . Early death Paternal Grandfather     SOCIAL HX:  reports that she has never smoked. She has never used smokeless tobacco. She reports current alcohol use. She reports previous drug use.   Current Outpatient Medications:  .  Cholecalciferol (VITAMIN D-3) 5000 units TABS, Take by mouth., Disp: , Rfl:  .  cyclobenzaprine (FLEXERIL) 5 MG tablet, Take 5 mg by mouth every 6 (six) hours as needed., Disp: , Rfl:  .  albuterol (VENTOLIN HFA) 108 (90 Base) MCG/ACT inhaler, Inhale 2 puffs into the lungs every 6 (six) hours as needed for wheezing or shortness of breath., Disp: 18 g, Rfl: 1 .  azithromycin (ZITHROMAX) 500 MG tablet, Take 1 tablet (500 mg total) by mouth daily., Disp: 7 tablet, Rfl: 0 .  benzonatate (TESSALON) 200 MG capsule, Take 1 capsule (200 mg total) by mouth 3 (three) times daily as needed for cough., Disp: 60 capsule, Rfl: 1 .  chlorpheniramine-HYDROcodone (TUSSIONEX PENNKINETIC ER) 10-8 MG/5ML SUER, Take 5 mLs by mouth at bedtime as needed., Disp: 140 mL,  Rfl: 0 .  predniSONE (DELTASONE) 10 MG tablet, 6 tablets on Day 1 , then reduce by 1 tablet daily until gone, Disp: 21 tablet, Rfl: 0  EXAM:  VITALS per patient if applicable:  GENERAL: alert, oriented, appears well and in no acute distress  HEENT: atraumatic, conjunttiva clear, no obvious abnormalities on inspection of external nose and ears  NECK: normal movements of the head and neck  LUNGS: conversation interrupted with frequent coughing,  But on inspection no signs of respiratory distress, breathing rate appears normal, no obvious gross SOB, gasping or wheezing  CV:  no obvious cyanosis  MS: moves all visible extremities without noticeable abnormality  PSYCH/NEURO: emotionally distraught secondary to grief , speech and thought processing grossly intact  ASSESSMENT AND PLAN:  Discussed the following assessment and plan:  COVID-19 virus infection  COVID-19 virus infection Diagnosed Dec 6,  Symptomatic since Dec 4, now with new onset fevers since Dec 11.  Prednisone, azithromycin,  Albuterol MDI ,  Tessalon for daytie cough,  Tussionex for nighttime cough.  Continue quarantine until Dec 20 AND must be fever free for 3 days     I discussed the assessment and treatment plan with the patient. The patient was provided an opportunity to ask questions and all were answered. The patient agreed with the plan and demonstrated an understanding of the instructions.   The patient was advised to call back or seek an in-person evaluation if the symptoms worsen or if the condition fails to improve as anticipated.   I provided  25 minutes of non-face-to-face time during this encounter reviewing patient's current problems and past procedures/imaging studies, providing counseling on the above mentioned problems , and coordination  of care . Crecencio Mc, MD

## 2019-09-04 NOTE — Assessment & Plan Note (Addendum)
Diagnosed Dec 6,  Symptomatic since Dec 4, now with new onset fevers since Dec 11.  Prednisone, azithromycin,  Albuterol MDI ,  Tessalon for daytie cough,  Tussionex for nighttime cough.  Continue quarantine until Dec 20 AND must be fever free for 3 days

## 2019-09-04 NOTE — Progress Notes (Signed)
Pt stated that she tested positive on 08/27/2019 but symptoms started on 08/25/2019. Pt is still having a cough and a fever on and off and is concerned about her breathing. Pt stated that she didn't develop the fever until 09/01/2019.

## 2019-09-04 NOTE — Patient Instructions (Signed)
I have sent 5 medications to [facility]:  Prednisone taper  (6 days ,  Tapering dose ,  Take each day's dose ALL AT ONCE EACH MORNING)  Azithromycin 500 mg daily x 7 days Albuterol MDI for wheezing, chest tightness Tessalon perles for daytime cough not controlled with DM Tussionex (contains hydrocodone) for severe or nighttime cough   .  You should continue your self imposed quantarine until Dec 20th and until you can answer "yes" to ALL of the conditions below:     Your body temperature  has been normal for at least 72 hours (WITHOUT the use of any tylenol, motrin or aleve) . Normal is < 100.4 Farenheit   your other flu  like symptoms symptoms are getting better.    I will have the office call you to set up a follow up later in the week  Regards,   Deborra Medina, MD

## 2019-09-08 ENCOUNTER — Other Ambulatory Visit: Payer: Self-pay

## 2019-09-08 ENCOUNTER — Encounter: Payer: Self-pay | Admitting: Internal Medicine

## 2019-09-08 ENCOUNTER — Ambulatory Visit (INDEPENDENT_AMBULATORY_CARE_PROVIDER_SITE_OTHER): Payer: BC Managed Care – PPO | Admitting: Internal Medicine

## 2019-09-08 DIAGNOSIS — F4321 Adjustment disorder with depressed mood: Secondary | ICD-10-CM

## 2019-09-08 DIAGNOSIS — U071 COVID-19: Secondary | ICD-10-CM | POA: Diagnosis not present

## 2019-09-08 NOTE — Progress Notes (Signed)
Pt states she is feeling some what better. She still has a cough, chest congestion and extreme fatigue.

## 2019-09-08 NOTE — Progress Notes (Signed)
Virtual Visit via [email or web address]  This visit type was conducted due to national recommendations for restrictions regarding the COVID-19 pandemic (e.g. social distancing).  This format is felt to be most appropriate for this patient at this time.  All issues noted in this document were discussed and addressed.  No physical exam was performed (except for noted visual exam findings with Video Visits).   I connected with@ on 09/08/19 at 10:30 AM EST by a video enabled telemedicine applicationand verified that I am speaking with the correct person using two identifiers. Location patient: home Location provider: work or home office Persons participating in the virtual visit: patient, provider  I discussed the limitations, risks, security and privacy concerns of performing an evaluation and management service by telephone and the availability of in person appointments. I also discussed with the patient that there may be a patient responsible charge related to this service. The patient expressed understanding and agreed to proceed.   Reason for visit: Follow up on COVID 19 INFECTION   HPI:  Feeling better.  Last fever was dec 13th ('Sunday) . 15 DAY  QUARANTINE ENDS TOMORROW. Still very fatigued, and chest feels tight,  But not short of breath and sats are normal.  Still has harsh non productive cough    ROS: See pertinent positives and negatives per HPI.  Past Medical History:  Diagnosis Date  . Arthritis     Past Surgical History:  Procedure Laterality Date  . 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 SHOULDER SURGERY     right capsular plication 4268     Family History  Problem Relation Age of Onset  . Hyperlipidemia Mother   . Hypertension Mother   . Arthritis Father   . Cancer Father        neuroendocrine tumor small intestine to liver met  . COPD Father   . Hearing loss Father   . Depression Sister   .  Diabetes Sister   . Seizures Sister   . Depression Brother   . Drug abuse Brother   . Stroke Maternal Grandmother   . Cancer Maternal Grandmother        testicular   . Early death Paternal Grandfather     SOCIAL HX:  reports that she has never smoked. She has never used smokeless tobacco. She reports current alcohol use. She reports previous drug use.   Current Outpatient Medications:  .  albuterol (VENTOLIN HFA) 108 (90 Base) MCG/ACT inhaler, Inhale 2 puffs into the lungs every 6 (six) hours as needed for wheezing or shortness of breath., Disp: 18 g, Rfl: 1 .  azithromycin (ZITHROMAX) 500 MG tablet, Take 1 tablet (500 mg total) by mouth daily., Disp: 7 tablet, Rfl: 0 .  benzonatate (TESSALON) 200 MG capsule, Take 1 capsule (200 mg total) by mouth 3 (three) times daily as needed for cough., Disp: 60 capsule, Rfl: 1 .  chlorpheniramine-HYDROcodone (TUSSIONEX PENNKINETIC ER) 10-8 MG/5ML SUER, Take 5 mLs by mouth at bedtime as needed., Disp: 140 mL, Rfl: 0 .  Cholecalciferol (VITAMIN D-3) 5000 units TABS, Take by mouth., Disp: , Rfl:  .  cyclobenzaprine (FLEXERIL) 5 MG tablet, Take 5 mg by mouth every 6 (six) hours as needed., Disp: , Rfl:  .  predniSONE (DELTASONE) 10 MG tablet, 6 tablets on Day 1 , then reduce by 1 tablet daily until gone, Disp: 21 tablet, Rfl: 0  EXAM:  VITALS per patient if applicable:  GENERAL: alert, oriented, appears well and in no acute distress  HEENT: atraumatic, conjunttiva clear, no obvious abnormalities on inspection of external nose and ears  NECK: normal movements of the head and neck  LUNGS: on inspection no signs of respiratory distress, breathing rate appears normal, no obvious gross SOB, gasping or wheezing  CV: no obvious cyanosis  MS: moves all visible extremities without noticeable abnormality  PSYCH/NEURO: pleasant and cooperative, no obvious depression or anxiety, speech and thought processing grossly intact  ASSESSMENT AND  PLAN:  Discussed the following assessment and plan:  Grief reaction  COVID-19 virus infection  Grief reaction Her mother Alden Hipp died of COVID complications this morning in Hospice   COVID-19 virus infection She is clinically Improving.  Afebrile, not dyspneic.  However, her Cough  And fatigue are still present.  quarantine ends dec 19    I discussed the assessment and treatment plan with the patient. The patient was provided an opportunity to ask questions and all were answered. The patient agreed with the plan and demonstrated an understanding of the instructions.   The patient was advised to call back or seek an in-person evaluation if the symptoms worsen or if the condition fails to improve as anticipated.  I provided  15 minutes of non-face-to-face time during this encounter.   Crecencio Mc, MD

## 2019-09-08 NOTE — Assessment & Plan Note (Addendum)
She is clinically Improving.  Afebrile, not dyspneic.  However, her Cough  And fatigue are still present.  quarantine ends dec 19

## 2019-09-08 NOTE — Assessment & Plan Note (Addendum)
Her mother Alden Hipp died of COVID complications this morning in Hospice

## 2019-09-11 ENCOUNTER — Telehealth: Payer: Self-pay | Admitting: Internal Medicine

## 2019-09-11 NOTE — Telephone Encounter (Signed)
I called pt to express condolences for the loss of her mother Mrs. Joyice Faster who was also my patient last week but was only able to leave voicemail  How is the patient doing? She was recently diagnosed with covid 3 and her mom was positive as well?    [place]

## 2019-09-12 NOTE — Telephone Encounter (Signed)
Left message for patient to return call back.  

## 2019-09-12 NOTE — Telephone Encounter (Signed)
She is feeling better, but she is very exhausted. Chest is hurting, she wants xray when she can. Congestion is pretty much gone, feels like she is recovering.

## 2019-09-13 ENCOUNTER — Other Ambulatory Visit: Payer: BC Managed Care – PPO

## 2019-09-13 ENCOUNTER — Encounter: Payer: Self-pay | Admitting: Internal Medicine

## 2019-09-13 ENCOUNTER — Other Ambulatory Visit: Payer: Self-pay | Admitting: Internal Medicine

## 2019-09-13 DIAGNOSIS — U071 COVID-19: Secondary | ICD-10-CM

## 2019-09-13 NOTE — Telephone Encounter (Signed)
Called 09/13/19 asked if wanted CXR 12/25 or 12/28 ? I can order this   If worsening chest pain rec [facility] urgent care or ED

## 2019-09-14 ENCOUNTER — Other Ambulatory Visit: Payer: Self-pay | Admitting: Internal Medicine

## 2019-09-14 DIAGNOSIS — R079 Chest pain, unspecified: Secondary | ICD-10-CM

## 2019-09-14 DIAGNOSIS — U071 COVID-19: Secondary | ICD-10-CM

## 2019-09-20 ENCOUNTER — Telehealth: Payer: Self-pay | Admitting: Internal Medicine

## 2019-09-20 NOTE — Telephone Encounter (Signed)
Return to work letter sent via my chart.

## 2019-09-20 NOTE — Telephone Encounter (Signed)
Spoke with pt and she stated that she is needing a note stating that it is okay for her to return to work since she was diagnosed with covid on 08/27/2019. Pt stated that she came out of isolation on 09/09/2019 but still felt so fatigued that she slept almost all day every day until Dec. 23rd.

## 2019-09-20 NOTE — Telephone Encounter (Signed)
Pt needs a note stating that she can go back to work post positive covid test on 08/27/2019?Marland Kitchen Please advise [phone]

## 2019-09-21 ENCOUNTER — Encounter: Payer: Self-pay | Admitting: Internal Medicine

## 2019-09-21 NOTE — Telephone Encounter (Signed)
Pt is aware that she can print the letter off of her mychart.

## 2020-03-27 ENCOUNTER — Other Ambulatory Visit: Payer: Self-pay | Admitting: Internal Medicine

## 2020-03-28 ENCOUNTER — Ambulatory Visit: Payer: BC Managed Care – PPO | Admitting: Internal Medicine

## 2020-03-28 ENCOUNTER — Encounter: Payer: Self-pay | Admitting: Internal Medicine

## 2020-03-28 ENCOUNTER — Other Ambulatory Visit: Payer: Self-pay

## 2020-03-28 VITALS — BP 130/84 | HR 67 | Temp 98.1°F | Ht 68.25 in | Wt 158.8 lb

## 2020-03-28 DIAGNOSIS — E538 Deficiency of other specified B group vitamins: Secondary | ICD-10-CM

## 2020-03-28 DIAGNOSIS — R5383 Other fatigue: Secondary | ICD-10-CM

## 2020-03-28 DIAGNOSIS — N6310 Unspecified lump in the right breast, unspecified quadrant: Secondary | ICD-10-CM

## 2020-03-28 DIAGNOSIS — J9801 Acute bronchospasm: Secondary | ICD-10-CM

## 2020-03-28 DIAGNOSIS — E559 Vitamin D deficiency, unspecified: Secondary | ICD-10-CM

## 2020-03-28 DIAGNOSIS — R928 Other abnormal and inconclusive findings on diagnostic imaging of breast: Secondary | ICD-10-CM

## 2020-03-28 DIAGNOSIS — R0602 Shortness of breath: Secondary | ICD-10-CM

## 2020-03-28 DIAGNOSIS — Z1329 Encounter for screening for other suspected endocrine disorder: Secondary | ICD-10-CM

## 2020-03-28 DIAGNOSIS — Z0000 Encounter for general adult medical examination without abnormal findings: Secondary | ICD-10-CM

## 2020-03-28 DIAGNOSIS — N644 Mastodynia: Secondary | ICD-10-CM

## 2020-03-28 DIAGNOSIS — Z803 Family history of malignant neoplasm of breast: Secondary | ICD-10-CM

## 2020-03-28 DIAGNOSIS — Z1211 Encounter for screening for malignant neoplasm of colon: Secondary | ICD-10-CM

## 2020-03-28 DIAGNOSIS — E785 Hyperlipidemia, unspecified: Secondary | ICD-10-CM

## 2020-03-28 MED ORDER — ALBUTEROL SULFATE HFA 108 (90 BASE) MCG/ACT IN AERS: 1.0000 | INHALATION_SPRAY | Freq: Four times a day (QID) | RESPIRATORY_TRACT | 11 refills | Status: DC | PRN

## 2020-03-28 NOTE — Progress Notes (Signed)
Chief Complaint  Patient presents with  . Breast Problem  . Medication Refill    inhaler   Annual  1. C/o left nipple discharge with white discharge and tenderness and 1-2 oclock pea sized lump noticed new and mom H/o Breast cancer in her 97s. Noticed sx'ed x few weeks pain 2/10 notrhing tried  2. C/o fatigue, sob at times with exertion after having covid and brain fog  Review of Systems  Constitutional: Positive for malaise/fatigue. Negative for weight loss.  HENT: Negative for hearing loss.   Eyes: Negative for blurred vision.  Respiratory: Negative for shortness of breath.   Cardiovascular: Negative for chest pain.  Gastrointestinal: Negative for abdominal pain.  Genitourinary:       +breast pain  Musculoskeletal: Negative for falls.  Skin: Negative for rash.  Psychiatric/Behavioral: Negative for depression.   Past Medical History:  Diagnosis Date  . Arthritis   . COVID-19    2019/11/28  . Vitamin D deficiency    Past Surgical History:  Procedure Laterality Date  . 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 SHOULDER SURGERY     right capsular plication 7169    Family History  Problem Relation Age of Onset  . Hyperlipidemia Mother   . Hypertension Mother   . Breast cancer Mother   . Other Mother        covid 62 died due to this Nov 28, 2019  . Arthritis Father   . Cancer Father        neuroendocrine tumor small intestine to liver met  . COPD Father   . Hearing loss Father   . Depression Sister   . Diabetes Sister   . Seizures Sister   . Depression Brother   . Drug abuse Brother   . Stroke Maternal Grandmother   . Cancer Maternal Grandmother        testicular   . Early death Paternal Grandfather    Social History   Socioeconomic History  . Marital status: Married    Spouse name: Not on file  . Number of children: Not on file  . Years of education: Not on file  . Highest education  level: Not on file  Occupational History  . Not on file  Tobacco Use  . Smoking status: Never Smoker  . Smokeless tobacco: Never Used  Substance and Sexual Activity  . Alcohol use: Yes  . Drug use: Not Currently  . Sexual activity: Yes  Other Topics Concern  . Not on file  Social History Narrative   3 kids   Married    Owns guns, wears seat belt, safe in relationship   [employer].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er    Social Determinants of Health   Financial Resource Strain:   . Difficulty of Paying Living Expenses:   Food Insecurity:   . Worried About Charity fundraiser in the Last Year:   . Arboriculturist in the Last Year:   Transportation Needs:   . Film/video editor (Medical):   Marland Kitchen Lack of Transportation (Non-Medical):   Physical Activity:   . Days of Exercise per Week:   . Minutes of Exercise per Session:   Stress:   . Feeling of Stress :   Social Connections:   . Frequency of Communication with Friends and Family:   . Frequency of Social Gatherings with Friends and Family:   . Attends Religious Services:   .  Active Member of Clubs or Organizations:   . Attends Archivist Meetings:   Marland Kitchen Marital Status:   Intimate Partner Violence:   . Fear of Current or Ex-Partner:   . Emotionally Abused:   Marland Kitchen Physically Abused:   . Sexually Abused:    No outpatient medications have been marked as taking for the 03/28/20 encounter (Office Visit) with McLean-Scocuzza, Nino Glow, MD.   No Known Allergies No results found for this or any previous visit (from the past 2160 hour(s)). Objective  Body mass index is 23.97 kg/m. Wt Readings from Last 3 Encounters:  03/28/20 158 lb 12.8 oz (72 kg)  09/08/19 145 lb (65.8 kg)  09/04/19 145 lb (65.8 kg)   Temp Readings from Last 3 Encounters:  03/28/20 98.1 F (36.7 C) (Oral)  06/01/18 98.2 F (36.8 C) (Oral)  03/01/18 98.7 F (37.1 C) (Oral)   BP Readings from Last 3 Encounters:  03/28/20 130/84  06/01/18 122/82  03/01/18 112/62     Pulse Readings from Last 3 Encounters:  03/28/20 67  09/04/19 64  06/01/18 71    Physical Exam Vitals and nursing note reviewed.  Constitutional:      Appearance: Normal appearance. She is well-developed and well-groomed.  HENT:     Head: Normocephalic and atraumatic.  Cardiovascular:     Rate and Rhythm: Normal rate and regular rhythm.     Heart sounds: Normal heart sounds.  Chest:     Breasts:        Right: Mass present.        Left: Nipple discharge present.       Comments: Left nipple white white discharge and prior bleeding Neurological:     General: No focal deficit present.     Mental Status: She is alert and oriented to person, place, and time.     Gait: Gait normal.  Psychiatric:        Attention and Perception: Attention and perception normal.        Mood and Affect: Mood and affect normal.        Speech: Speech normal.        Behavior: Behavior normal. Behavior is cooperative.        Thought Content: Thought content normal.        Cognition and Memory: Cognition and memory normal.        Judgment: Judgment normal.     Assessment  Plan  Annual physical exam Flu 06/10/2019  MMR immune  Tdap utd  covid 2/2  Disc hep B vaccine future  Fasting labs  rec healthy diet and exercise   Reviewed labs declines STD check  Reviewed mammogram 03/29/18 neg referred today  Colonoscopy referred today Dr. Alice Reichert Pap today surgically absent uterus and cervix mild discharge s/p hysterectomy cin 2 07/2007 with ovaries and FT intact  Disc colonoscopy due age 73 y.o  Dermatology FIL Dr. Koleen Nimrod (retired dermatologist) he checks her skin    Breast pain - Plan: MM DIAG BREAST TOMO BILATERAL, US BREAST LTD UNI LEFT INC AXILLA, US BREAST LTD UNI RIGHT INC AXILLA  FH: breast cancer in first degree relative - Plan: MM DIAG BREAST TOMO BILATERAL, US BREAST LTD UNI LEFT INC AXILLA, US BREAST LTD UNI RIGHT INC AXILLA  Shortness of breath - Plan: albuterol (VENTOLIN HFA) 108  (90 Base) MCG/ACT inhaler Bronchospasm - Plan: albuterol (VENTOLIN HFA) 108 (90 Base) MCG/ACT inhaler Consider pulm referral in the future   Vitamin D deficiency - Plan: Vitamin D (  25 hydroxy)  Hyperlipidemia, unspecified hyperlipidemia type - Plan: Lipid panel  Fatigue, unspecified type - Plan: Comprehensive metabolic panel, CBC with Differential/Platelet, TSH, Urinalysis, Routine w reflex microscopic, Iron, TIBC and Ferritin Panel HM w/u as well     Provider: Dr. Olivia Mackie McLean-Scocuzza-Internal Medicine

## 2020-03-28 NOTE — Progress Notes (Signed)
Patient presenting with left sided breast pain in the nipple area, also noticed some white discharge from the nipple last night. Patient also noticed a small lump on the right breast. The left breast has been painful for a week while the lump on the right breast was noticed a few weeks ago.   Patient has a family history of breast cancer through her mother.   Patient flagged: Current status:  OVERDUE FOR COLORECTAL CANCER SCREENING

## 2020-03-28 NOTE — Patient Instructions (Addendum)
Referred [facility]  Dr. Norma Fredrickson GI doctor   Breast Tenderness Breast tenderness is a common problem for women of all ages, but may also occur in men. Breast tenderness may range from mild discomfort to severe pain. In women, the pain usually comes and goes with the menstrual cycle, but it can also be constant. Breast tenderness has many possible causes, including hormone changes, infections, and taking certain medicines. You may have tests, such as a mammogram or an ultrasound, to check for any unusual findings. Having breast tenderness usually does not mean that you have breast cancer. Follow these instructions at home: Managing pain and discomfort   If directed, put ice to the painful area. To do this: ? Put ice in a plastic bag. ? Place a towel between your skin and the bag. ? Leave the ice on for 20 minutes, 2-3 times a day.  Wear a supportive bra, especially during exercise. You may also want to wear a supportive bra while sleeping if your breasts are very tender. Medicines  Take over-the-counter and prescription medicines only as told by your health care provider. If the cause of your pain is infection, you may be prescribed an antibiotic medicine.  If you were prescribed an antibiotic, take it as told by your health care provider. Do not stop taking the antibiotic even if you start to feel better. Eating and drinking  Your health care provider may recommend that you lessen the amount of fat in your diet. You can do this by: ? Limiting fried foods. ? Cooking foods using methods such as baking, boiling, grilling, and broiling.  Decrease the amount of caffeine in your diet. Instead, drink more water and choose caffeine-free drinks. General instructions   Keep a log of the days and times when your breasts are most tender.  Ask your health care provider how to do breast exams at home. This will help you notice if you have an unusual growth or lump.  Keep all follow-up visits  as told by your health care provider. This is important. Contact a health care provider if:  Any part of your breast is hard, red, and hot to the touch. This may be a sign of infection.  You are a woman and: ? Not breastfeeding and you have fluid, especially blood or pus, coming out of your nipples. ? Have a new or painful lump in your breast that remains after your menstrual period ends.  You have a fever.  Your pain does not improve or it gets worse.  Your pain is interfering with your daily activities. Summary  Breast tenderness may range from mild discomfort to severe pain.  Breast tenderness has many possible causes, including hormone changes, infections, and taking certain medicines.  It can be treated with ice, wearing a supportive bra, and medicines.  Make changes to your diet if told to by your health care provider. This information is not intended to replace advice given to you by your health care provider. Make sure you discuss any questions you have with your health care provider. Document Revised: 01/30/2019 Document Reviewed: 01/30/2019 Elsevier Patient Education  2020 [employer].

## 2020-03-29 ENCOUNTER — Other Ambulatory Visit (INDEPENDENT_AMBULATORY_CARE_PROVIDER_SITE_OTHER): Payer: BC Managed Care – PPO

## 2020-03-29 DIAGNOSIS — E559 Vitamin D deficiency, unspecified: Secondary | ICD-10-CM

## 2020-03-29 DIAGNOSIS — E785 Hyperlipidemia, unspecified: Secondary | ICD-10-CM | POA: Diagnosis not present

## 2020-03-29 DIAGNOSIS — R5383 Other fatigue: Secondary | ICD-10-CM | POA: Diagnosis not present

## 2020-03-29 DIAGNOSIS — E538 Deficiency of other specified B group vitamins: Secondary | ICD-10-CM | POA: Diagnosis not present

## 2020-03-29 DIAGNOSIS — Z1329 Encounter for screening for other suspected endocrine disorder: Secondary | ICD-10-CM

## 2020-03-29 LAB — CBC WITH DIFFERENTIAL/PLATELET
Basophils Absolute: 0 10*3/uL (ref 0.0–0.1)
Basophils Relative: 0.7 % (ref 0.0–3.0)
Eosinophils Absolute: 0 10*3/uL (ref 0.0–0.7)
Eosinophils Relative: 0.7 % (ref 0.0–5.0)
HCT: 40.1 % (ref 36.0–46.0)
Hemoglobin: 13.4 g/dL (ref 12.0–15.0)
Lymphocytes Relative: 25.3 % (ref 12.0–46.0)
Lymphs Abs: 1.6 10*3/uL (ref 0.7–4.0)
MCHC: 33.5 g/dL (ref 30.0–36.0)
MCV: 95.5 fl (ref 78.0–100.0)
Monocytes Absolute: 0.4 10*3/uL (ref 0.1–1.0)
Monocytes Relative: 5.9 % (ref 3.0–12.0)
Neutro Abs: 4.3 10*3/uL (ref 1.4–7.7)
Neutrophils Relative %: 67.4 % (ref 43.0–77.0)
Platelets: 352 10*3/uL (ref 150.0–400.0)
RBC: 4.19 Mil/uL (ref 3.87–5.11)
RDW: 13.5 % (ref 11.5–15.5)
WBC: 6.4 10*3/uL (ref 4.0–10.5)

## 2020-03-29 LAB — LIPID PANEL
Cholesterol: 215 mg/dL — ABNORMAL HIGH (ref 0–200)
HDL: 82.5 mg/dL (ref >39.00)
LDL Cholesterol: 122 mg/dL — ABNORMAL HIGH (ref 0–99)
NonHDL: 132.37
Total CHOL/HDL Ratio: 3
Triglycerides: 53 mg/dL (ref 0.0–149.0)
VLDL: 10.6 mg/dL (ref 0.0–40.0)

## 2020-03-29 LAB — COMPREHENSIVE METABOLIC PANEL
ALT: 9 U/L (ref 0–35)
AST: 14 U/L (ref 0–37)
Albumin: 4.8 g/dL (ref 3.5–5.2)
Alkaline Phosphatase: 40 U/L (ref 39–117)
BUN: 9 mg/dL (ref 6–23)
CO2: 27 mEq/L (ref 19–32)
Calcium: 9.5 mg/dL (ref 8.4–10.5)
Chloride: 101 mEq/L (ref 96–112)
Creatinine, Ser: 0.72 mg/dL (ref 0.40–1.20)
GFR: 85.46 mL/min (ref >60.00)
Glucose, Bld: 87 mg/dL (ref 70–99)
Potassium: 3.9 mEq/L (ref 3.5–5.1)
Sodium: 138 mEq/L (ref 135–145)
Total Bilirubin: 0.6 mg/dL (ref 0.2–1.2)
Total Protein: 6.9 g/dL (ref 6.0–8.3)

## 2020-03-29 LAB — TSH: TSH: 0.98 mIU/L

## 2020-03-29 LAB — VITAMIN D 25 HYDROXY (VIT D DEFICIENCY, FRACTURES): VITD: 31.88 ng/mL (ref 30.00–100.00)

## 2020-03-29 LAB — IRON,TIBC AND FERRITIN PANEL
%SAT: 33 % (calc) (ref 16–45)
Ferritin: 49 ng/mL (ref 16–232)
Iron: 100 ug/dL (ref 45–160)
TIBC: 307 mcg/dL (calc) (ref 250–450)

## 2020-03-29 LAB — VITAMIN B12: Vitamin B-12: 170 pg/mL — ABNORMAL LOW (ref 211–911)

## 2020-03-30 LAB — URINALYSIS, ROUTINE W REFLEX MICROSCOPIC
Bilirubin Urine: NEGATIVE
Glucose, UA: NEGATIVE
Hgb urine dipstick: NEGATIVE
Leukocytes,Ua: NEGATIVE
Nitrite: NEGATIVE
Protein, ur: NEGATIVE
Specific Gravity, Urine: 1.004 (ref 1.001–1.03)
pH: 7.5 (ref 5.0–8.0)

## 2020-04-01 ENCOUNTER — Encounter: Payer: Self-pay | Admitting: Internal Medicine

## 2020-04-01 DIAGNOSIS — E538 Deficiency of other specified B group vitamins: Secondary | ICD-10-CM

## 2020-04-04 ENCOUNTER — Ambulatory Visit
Admission: RE | Admit: 2020-04-04 | Discharge: 2020-04-04 | Disposition: A | Discharge status: Home / Self Care | Payer: BC Managed Care – PPO | Source: Ambulatory Visit | Attending: Internal Medicine | Admitting: Internal Medicine

## 2020-04-04 DIAGNOSIS — N644 Mastodynia: Secondary | ICD-10-CM | POA: Insufficient documentation

## 2020-04-04 DIAGNOSIS — Z803 Family history of malignant neoplasm of breast: Secondary | ICD-10-CM | POA: Diagnosis present

## 2020-04-04 IMAGING — MG DIGITAL DIAGNOSTIC BILAT W/ TOMO W/ CAD
6 of 10 series · 6 of 30 positions shown · non-contrast
Comparison: Previous exam(s).

CLINICAL DATA: 48-year-old female presenting with a new lump in the
right breast for approximately 2 weeks.

EXAM:
DIGITAL DIAGNOSTIC BILATERAL MAMMOGRAM WITH CAD AND TOMO
ULTRASOUND RIGHT BREAST

[L CC synth-2D]
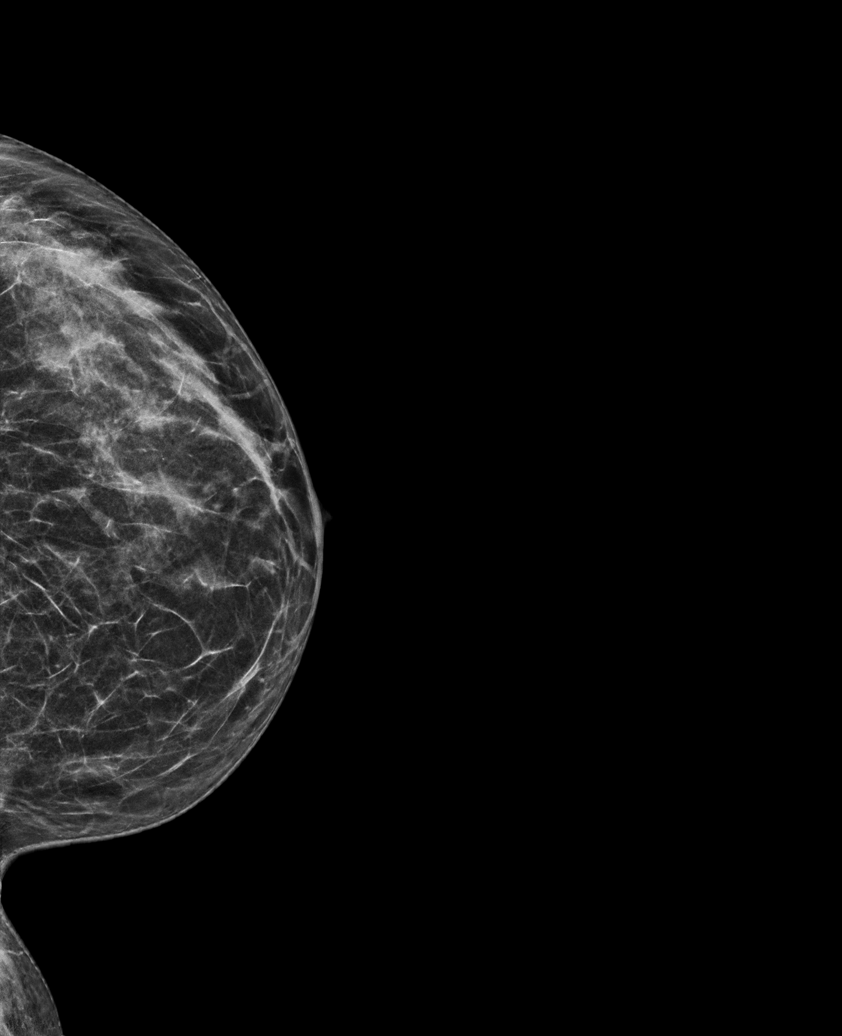

[R CC synth-2D (1 of 2)]
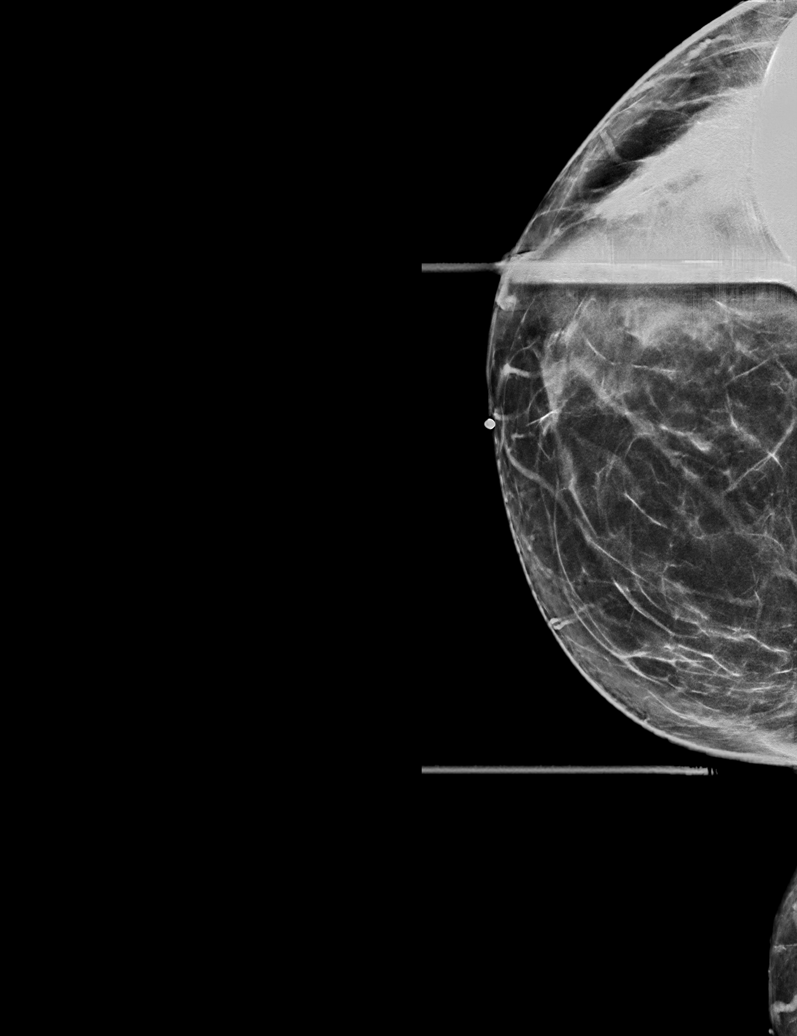

[R CC synth-2D (2 of 2)]
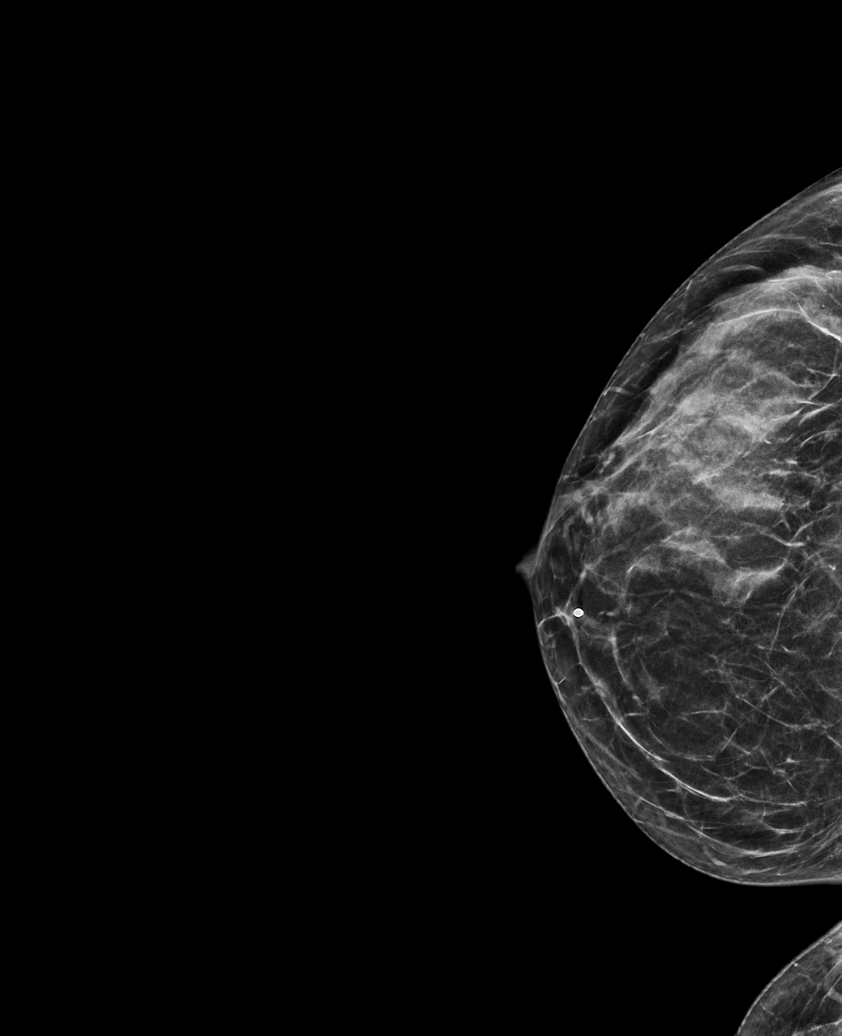

[R MLO synth-2D]
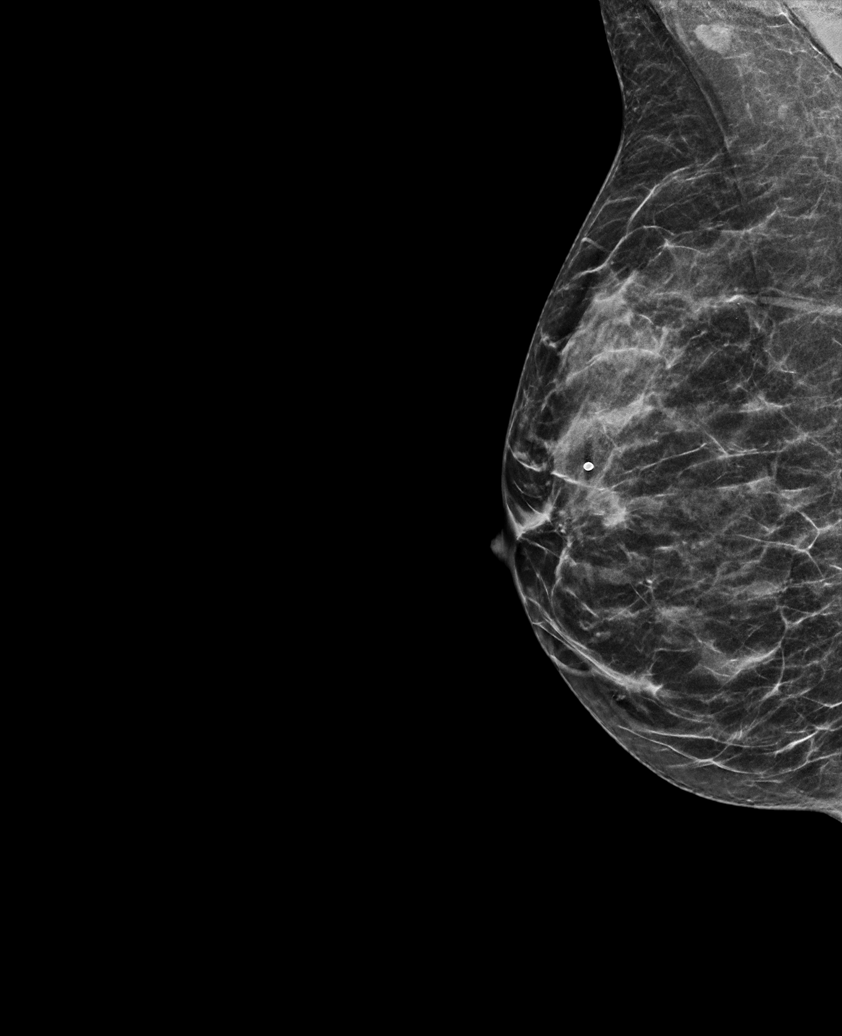

[L MLO synth-2D]
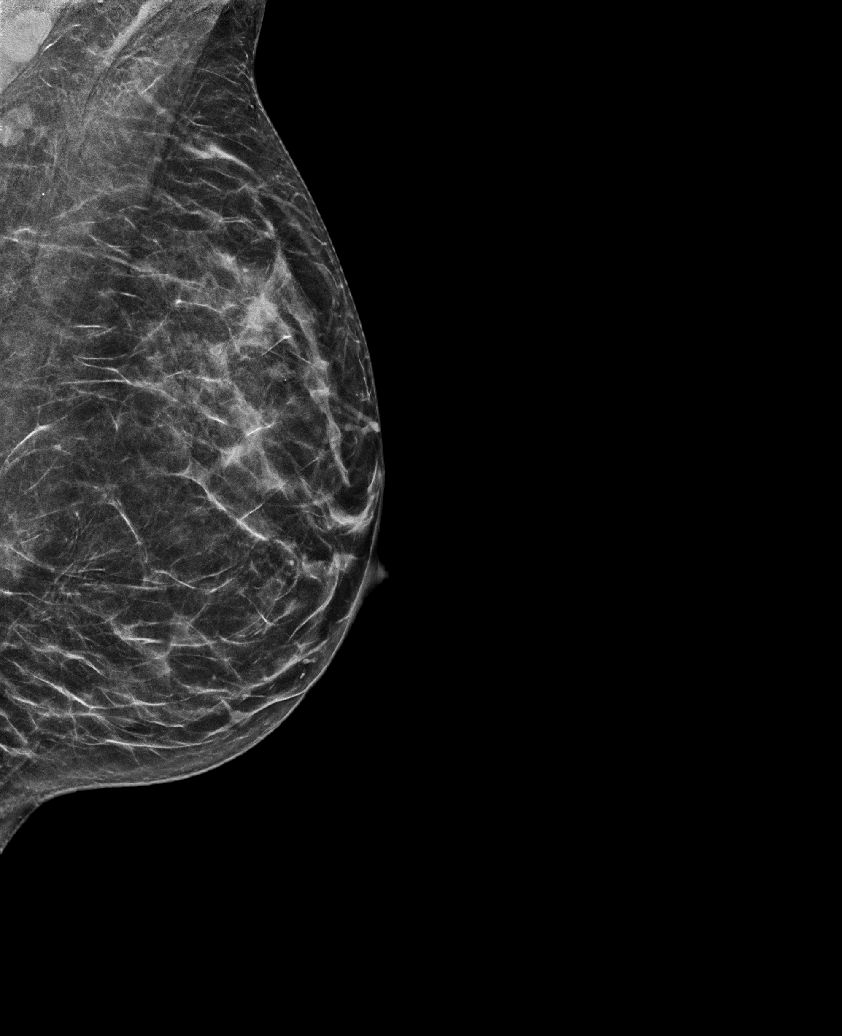

[R MLO tomo · tomo slice 24/47.0]
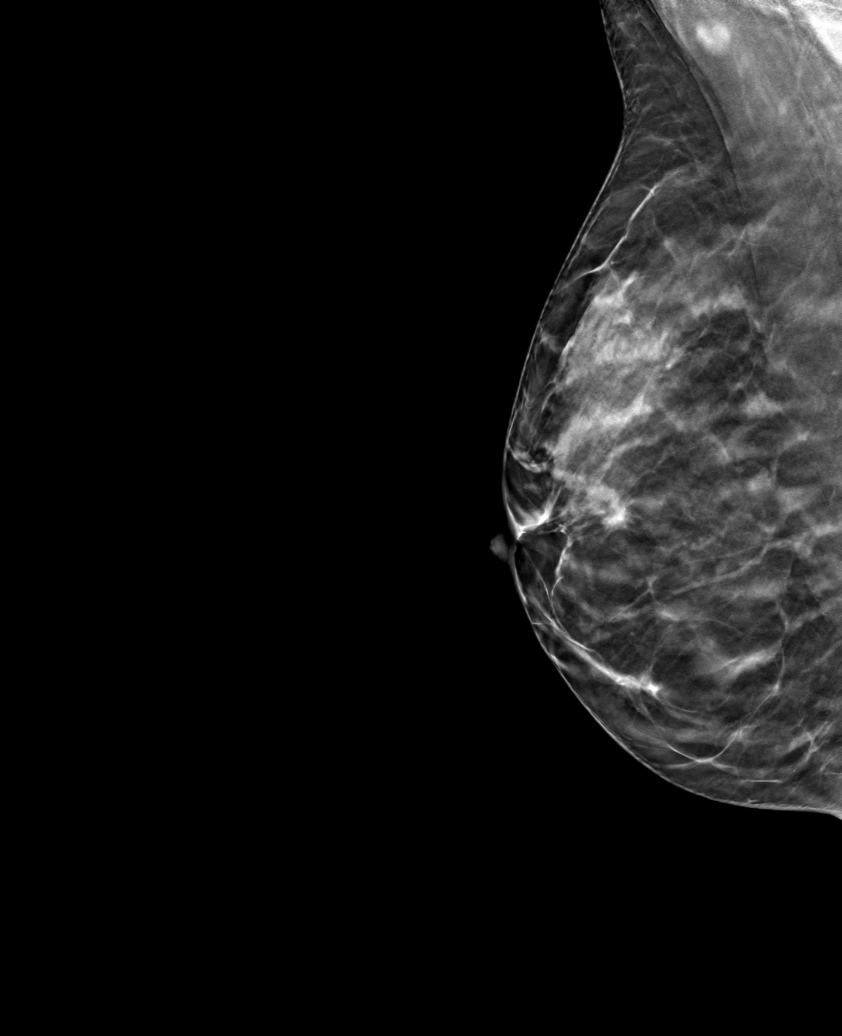

[6 of 30 positions shown; findings below may reference images not displayed]

ACR Breast Density Category c: The breast tissue is heterogeneously
dense, which may obscure small masses.
FINDINGS: Mammogram:

Right breast: A skin BB marks the palpable site of concern in the
right upper inner breast anterior depth. Spot compression
tomosynthesis views were performed in addition to standard views. No
new abnormality identified at the site of concern or elsewhere in
the right breast.

Left breast: No suspicious mass, distortion, or microcalcifications
are identified to suggest presence of malignancy.

Mammographic images were processed with CAD.

On physical exam, at the site of concern reported by the patient in
the upper inner right breast I palpate a small pebble size mass.

Ultrasound:

Targeted ultrasound is performed throughout the superior aspect of
the right breast in particular at the palpable area of concern at 1
o'clock 4 cm from the nipple demonstrating no definite cystic or
solid mass.
IMPRESSION: No mammographic or sonographic evidence of malignancy at the
palpable site of concern in the right breast.

RECOMMENDATION:
Diagnostic bilateral breast MRI. Given family history of breast
cancer in the patient's mother, dense breast tissue and concerning
new palpable finding, discussed the option of breast MRI with the
patient and she would like to proceed with this.

BI-RADS CATEGORY  1: Negative.

## 2020-04-04 IMAGING — US US BREAST*R* LIMITED INC AXILLA
1 series · 2 of 2 positions shown · non-contrast
Comparison: Previous exam(s).

CLINICAL DATA: 48-year-old female presenting with a new lump in the
right breast for approximately 2 weeks.

EXAM:
DIGITAL DIAGNOSTIC BILATERAL MAMMOGRAM WITH CAD AND TOMO
ULTRASOUND RIGHT BREAST

[Series 1: us breast*right* limited inc axilla · 0.06mm/px · 2 of 2 slices shown]
[im 1/2]
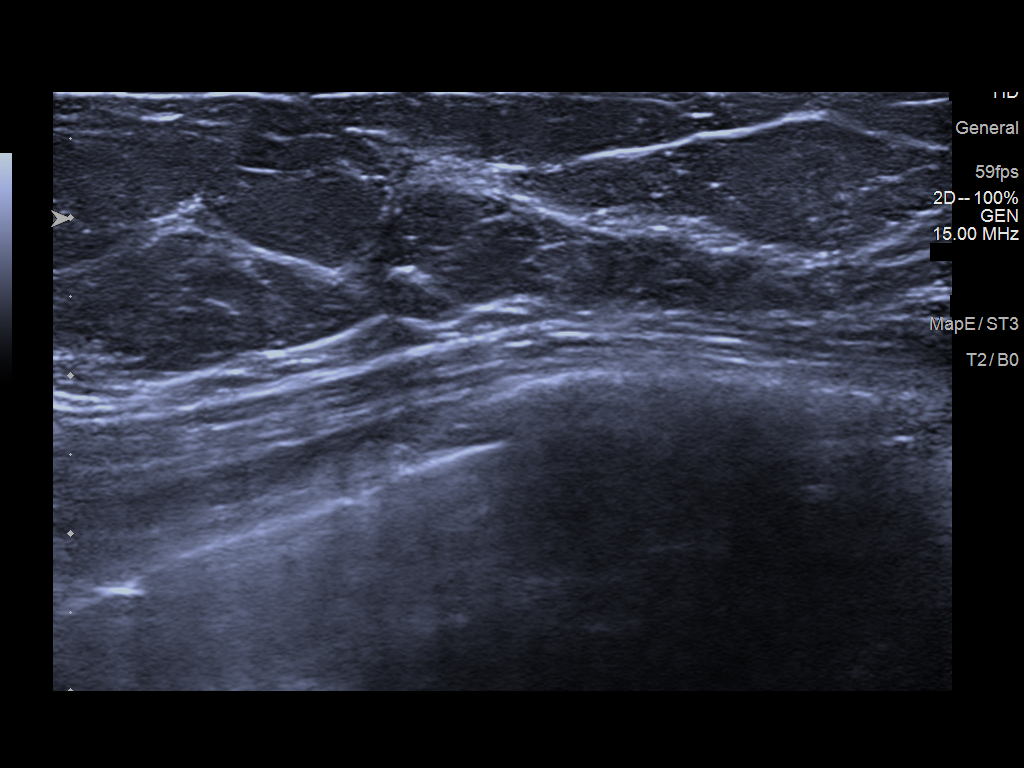
[im 2/2]
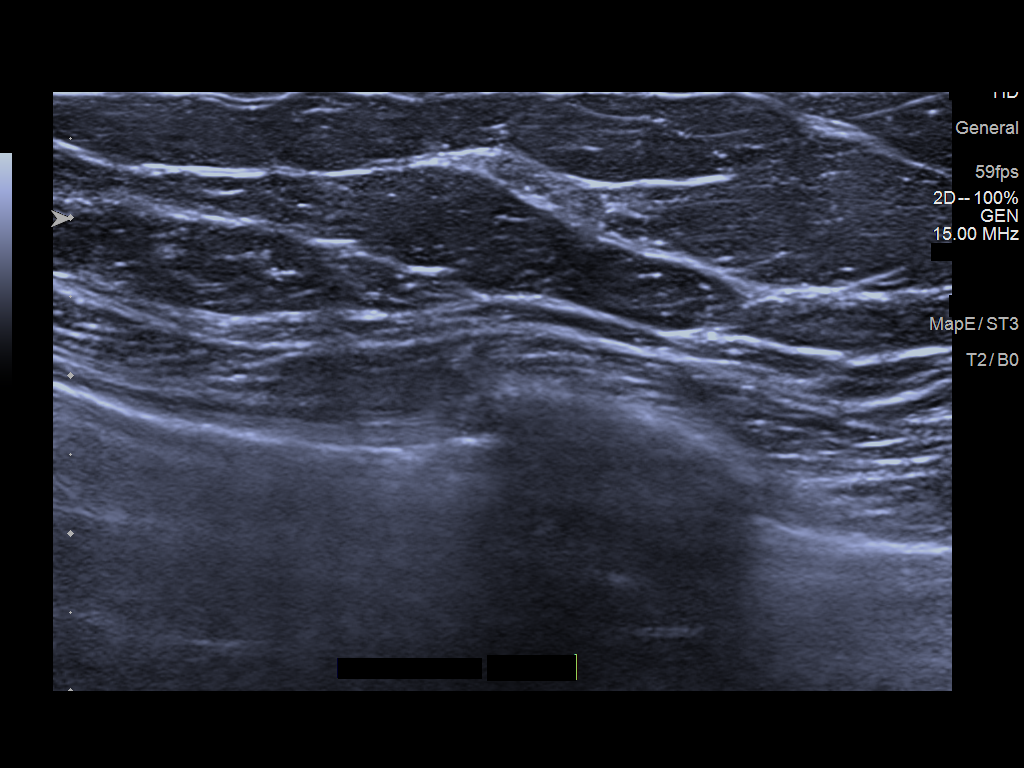

[2 of 2 positions shown; findings below may reference images not displayed]

ACR Breast Density Category c: The breast tissue is heterogeneously
dense, which may obscure small masses.
FINDINGS: Mammogram:

Right breast: A skin BB marks the palpable site of concern in the
right upper inner breast anterior depth. Spot compression
tomosynthesis views were performed in addition to standard views. No
new abnormality identified at the site of concern or elsewhere in
the right breast.

Left breast: No suspicious mass, distortion, or microcalcifications
are identified to suggest presence of malignancy.

Mammographic images were processed with CAD.

On physical exam, at the site of concern reported by the patient in
the upper inner right breast I palpate a small pebble size mass.

Ultrasound:

Targeted ultrasound is performed throughout the superior aspect of
the right breast in particular at the palpable area of concern at 1
o'clock 4 cm from the nipple demonstrating no definite cystic or
solid mass.
IMPRESSION: No mammographic or sonographic evidence of malignancy at the
palpable site of concern in the right breast.

RECOMMENDATION:
Diagnostic bilateral breast MRI. Given family history of breast
cancer in the patient's mother, dense breast tissue and concerning
new palpable finding, discussed the option of breast MRI with the
patient and she would like to proceed with this.

BI-RADS CATEGORY  1: Negative.

## 2020-04-07 ENCOUNTER — Other Ambulatory Visit: Payer: Self-pay | Admitting: Internal Medicine

## 2020-04-07 NOTE — Addendum Note (Signed)
Addended by: Quentin Ore on: 04/07/2020 05:43 PM   Modules accepted: Orders

## 2020-04-11 ENCOUNTER — Telehealth: Payer: Self-pay | Admitting: Internal Medicine

## 2020-04-11 NOTE — Telephone Encounter (Signed)
lft vm for pt to call ofc to sch MRI. 

## 2020-04-17 ENCOUNTER — Other Ambulatory Visit: Payer: Self-pay

## 2020-04-17 ENCOUNTER — Ambulatory Visit (INDEPENDENT_AMBULATORY_CARE_PROVIDER_SITE_OTHER): Payer: BC Managed Care – PPO

## 2020-04-17 DIAGNOSIS — E538 Deficiency of other specified B group vitamins: Secondary | ICD-10-CM | POA: Diagnosis not present

## 2020-04-17 MED ADMIN — Cyanocobalamin Inj 1000 MCG/ML: 1000 ug | INTRAMUSCULAR | NDC 70069000501

## 2020-04-17 NOTE — Progress Notes (Signed)
Patient presented for B 12 injection to left deltoid, patient voiced no concerns nor showed any signs of distress during injection. 

## 2020-04-25 ENCOUNTER — Ambulatory Visit (INDEPENDENT_AMBULATORY_CARE_PROVIDER_SITE_OTHER): Payer: BC Managed Care – PPO

## 2020-04-25 ENCOUNTER — Other Ambulatory Visit: Payer: Self-pay

## 2020-04-25 DIAGNOSIS — E538 Deficiency of other specified B group vitamins: Secondary | ICD-10-CM | POA: Diagnosis not present

## 2020-04-25 MED ADMIN — Cyanocobalamin Inj 1000 MCG/ML: 1000 ug | INTRAMUSCULAR | NDC 70069000501

## 2020-04-25 NOTE — Progress Notes (Signed)
Patient presented for B 12 injection to left deltoid, patient voiced no concerns nor showed any signs of distress during injection. 

## 2020-04-30 ENCOUNTER — Telehealth: Payer: Self-pay | Admitting: Internal Medicine

## 2020-04-30 NOTE — Telephone Encounter (Signed)
Please advise 

## 2020-04-30 NOTE — Telephone Encounter (Signed)
Breast MRI rec based on mammogram findings  There is no other test  She can ask facility for  a payment plan through the billing dept

## 2020-04-30 NOTE — Telephone Encounter (Signed)
Pt called in stated that she had a breast exam and doctor find some lumps and she is schedule for a mri and she stated that it is very expensive and wanted to know if she had to do it now or could she wait until later wanted a call back

## 2020-05-02 ENCOUNTER — Ambulatory Visit: Payer: BC Managed Care – PPO

## 2020-05-03 ENCOUNTER — Ambulatory Visit
Admission: RE | Admit: 2020-05-03 | Discharge: 2020-05-03 | Disposition: A | Discharge status: Home / Self Care | Payer: BC Managed Care – PPO | Source: Ambulatory Visit | Attending: Internal Medicine | Admitting: Internal Medicine

## 2020-05-03 ENCOUNTER — Other Ambulatory Visit: Payer: Self-pay

## 2020-05-03 DIAGNOSIS — Z803 Family history of malignant neoplasm of breast: Secondary | ICD-10-CM | POA: Diagnosis present

## 2020-05-03 DIAGNOSIS — N6310 Unspecified lump in the right breast, unspecified quadrant: Secondary | ICD-10-CM | POA: Diagnosis present

## 2020-05-03 IMAGING — MR MR BREAST BILAT WO/W CM
2 of 12 series · 6 of 48 positions shown · IV contrast (gadavist)
Comparison: [DATE] mammography

CLINICAL DATA: The patient presented for a palpable lump in the
right breast [DATE]. The mammogram and ultrasound were
negative. Breast MRI was recommended due to the dense breast tissue,
the concerning new palpable lump, and the patient's family history
of breast cancer including a mother diagnosed in her 80s.

LABS:  None
EXAM:
BILATERAL BREAST MRI WITH AND WITHOUT CONTRAST
TECHNIQUE: Multiplanar, multisequence MR images of both breasts were obtained
prior to and following the intravenous administration of 7 ml of
Gadavist

[Series 2: T1 · axial · B · 1.5mm · 1.02mm/px · z∈[-83,+83]mm · 5 of 112 slices shown]
[im 1/112]
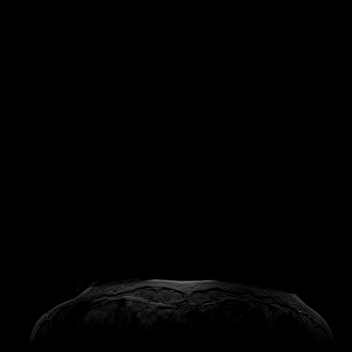
[im 28/112]
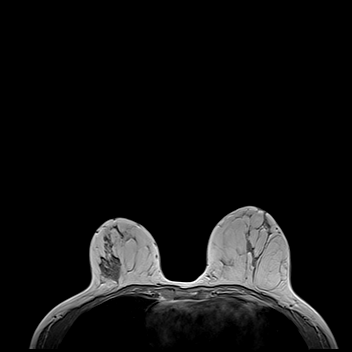
[im 56/112]
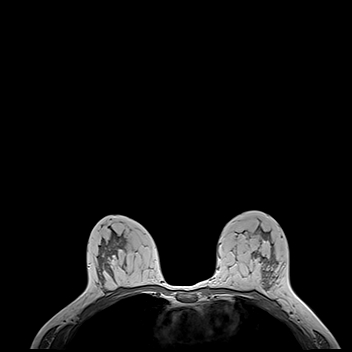
[im 84/112]
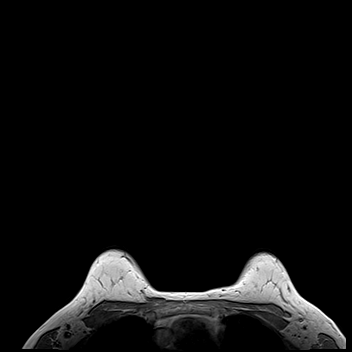
[im 112/112]
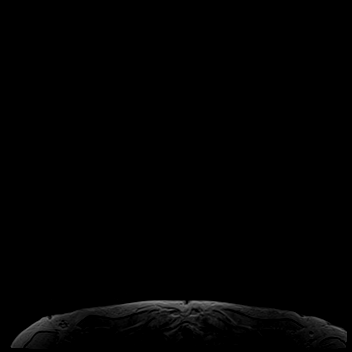

[Series 3: T2 · axial · B · 3.0mm · 1.02mm/px · 1 of 43 slices shown]
[im 1/43]
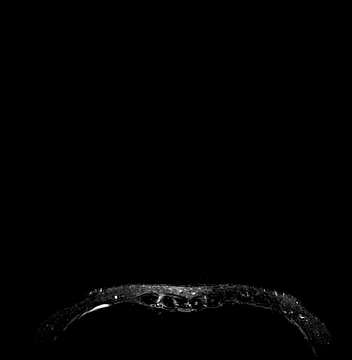

[6 of 48 positions shown; findings below may reference images not displayed]

Three-dimensional MR images were rendered by post-processing of the
original MR data on an independent workstation. The
three-dimensional MR images were interpreted, and findings are
reported in the following complete MRI report for this study. Three
dimensional images were evaluated at the independent [employer]
workstation
FINDINGS: Breast composition: c. Heterogeneous fibroglandular tissue.

Background parenchymal enhancement: Moderate.

Right breast: There is extensive non mass enhancement in the right
breast centered in the upper outer quadrant. The non mass
enhancement spans 6 cm in the AP dimension on series 6, image 53 and
3.0 cm in the transverse dimension on the same slice. The non mass
enhancement extends 4.1 cm in cranial caudal dimension. While the
enhancement is mostly centered in the upper outer quadrant, there is
extension just across midline into the upper inner right breast as
seen on series 6, image 55. The enhancement extends to the level the
nipple inferiorly but not below the level of the nipple. Most of the
enhancement demonstrates persistent kinetics. There are a few small
internal regions of washout. No other abnormalities identified in
the right breast.

Left breast: There is a single small enhancing mass in the upper
outer left breast on series 6, image 47 measuring 4 mm. The small
mass is associated with increased T2 signal and demonstrates
persistent enhancement kinetics. No other suspicious findings
identified on the left.

Lymph nodes: No abnormal appearing lymph nodes.

Ancillary findings:  None.
IMPRESSION: 1. There is a large area of non mass enhancement centered in the
upper outer quadrant of the right breast extending slightly into the
upper inner right breast. This region of enhancement measures at
least 6 x 3 x 4.1 cm.
2. There is a single small enhancing mass measuring 4 mm in the
upper outer left breast on series 6, image 47.
3. No other abnormalities.

RECOMMENDATION:
1. Recommend 2 MRI guided biopsies of the non mass enhancement
centered in the upper outer right breast. The 2 biopsies should be
targeted to prove extent of disease should the biopsies demonstrate
malignancy.
2. Recommend MRI guided biopsy of the single small left breast mass
described above.

BI-RADS CATEGORY  4: Suspicious.

## 2020-05-03 MED ORDER — GADOBUTROL 1 MMOL/ML IV SOLN
7.0000 mL | Freq: Once | INTRAVENOUS | Status: AC | PRN
  Administered 2020-05-03: 7 mL via INTRAVENOUS

## 2020-05-03 NOTE — Telephone Encounter (Signed)
Patient calling back in and states she had the MRI today. Informed that the results could take up to 3 days to come in depending on how backed up the radiology department is.   Patient verbalized understanding

## 2020-05-03 NOTE — Telephone Encounter (Signed)
Left message to return call 

## 2020-05-06 ENCOUNTER — Other Ambulatory Visit: Payer: Self-pay | Admitting: Internal Medicine

## 2020-05-06 DIAGNOSIS — R9389 Abnormal findings on diagnostic imaging of other specified body structures: Secondary | ICD-10-CM

## 2020-05-06 NOTE — Telephone Encounter (Signed)
Patient has been informed of MRI results. Orders have been placed for [facility] imaging.  Patient has had all previous imagine at [facility]. Wanting to know if this can be for [facility]?   Please advise

## 2020-05-07 ENCOUNTER — Ambulatory Visit (INDEPENDENT_AMBULATORY_CARE_PROVIDER_SITE_OTHER): Payer: BC Managed Care – PPO

## 2020-05-07 ENCOUNTER — Other Ambulatory Visit: Payer: Self-pay

## 2020-05-07 ENCOUNTER — Telehealth: Payer: Self-pay | Admitting: Internal Medicine

## 2020-05-07 DIAGNOSIS — E538 Deficiency of other specified B group vitamins: Secondary | ICD-10-CM

## 2020-05-07 MED ORDER — CYANOCOBALAMIN 1000 MCG/ML IJ SOLN
1000.0000 ug | Freq: Once | INTRAMUSCULAR | Status: AC
  Administered 2020-05-07: 1000 ug via INTRAMUSCULAR

## 2020-05-07 NOTE — Telephone Encounter (Signed)
No they only do biopsy in [place]

## 2020-05-07 NOTE — Telephone Encounter (Signed)
Patient informed and verbalized understanding.   She has been scheduled at [facility] imaging for this coming Monday.

## 2020-05-07 NOTE — Telephone Encounter (Signed)
err

## 2020-05-07 NOTE — Progress Notes (Signed)
Patient presented for B 12 injection to left deltoid, patient voiced no concerns nor showed any signs of distress during injection. 

## 2020-05-09 ENCOUNTER — Ambulatory Visit: Payer: BC Managed Care – PPO

## 2020-05-13 ENCOUNTER — Other Ambulatory Visit: Payer: Self-pay | Admitting: Diagnostic Radiology

## 2020-05-13 ENCOUNTER — Other Ambulatory Visit: Payer: Self-pay

## 2020-05-13 ENCOUNTER — Ambulatory Visit
Admission: RE | Admit: 2020-05-13 | Discharge: 2020-05-13 | Disposition: A | Discharge status: Home / Self Care | Payer: BC Managed Care – PPO | Source: Ambulatory Visit | Attending: Internal Medicine | Admitting: Internal Medicine

## 2020-05-13 DIAGNOSIS — R9389 Abnormal findings on diagnostic imaging of other specified body structures: Secondary | ICD-10-CM

## 2020-05-13 HISTORY — PX: BREAST BIOPSY: SHX20

## 2020-05-13 IMAGING — MG MM BREAST LOCALIZATION CLIP
4 series · 4 of 12 positions shown · non-contrast
Comparison: Previous exam(s).

CLINICAL DATA: 48-year-old female status post bilateral MRI guided
biopsies.

EXAM:
DIAGNOSTIC BILATERAL MAMMOGRAM POST MRI BIOPSY

[L CC synth-2D]
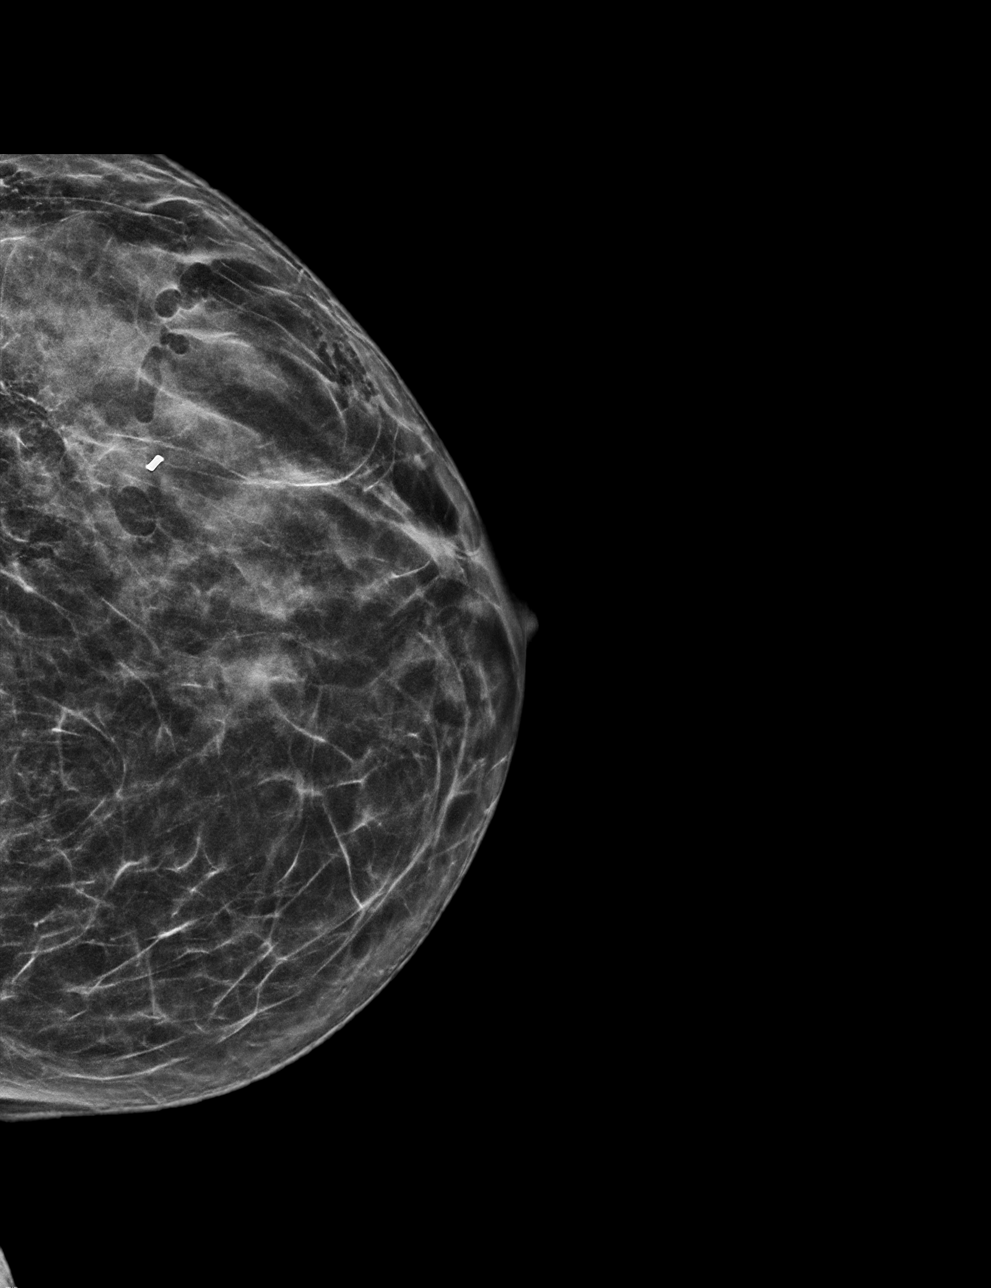

[L ML synth-2D]
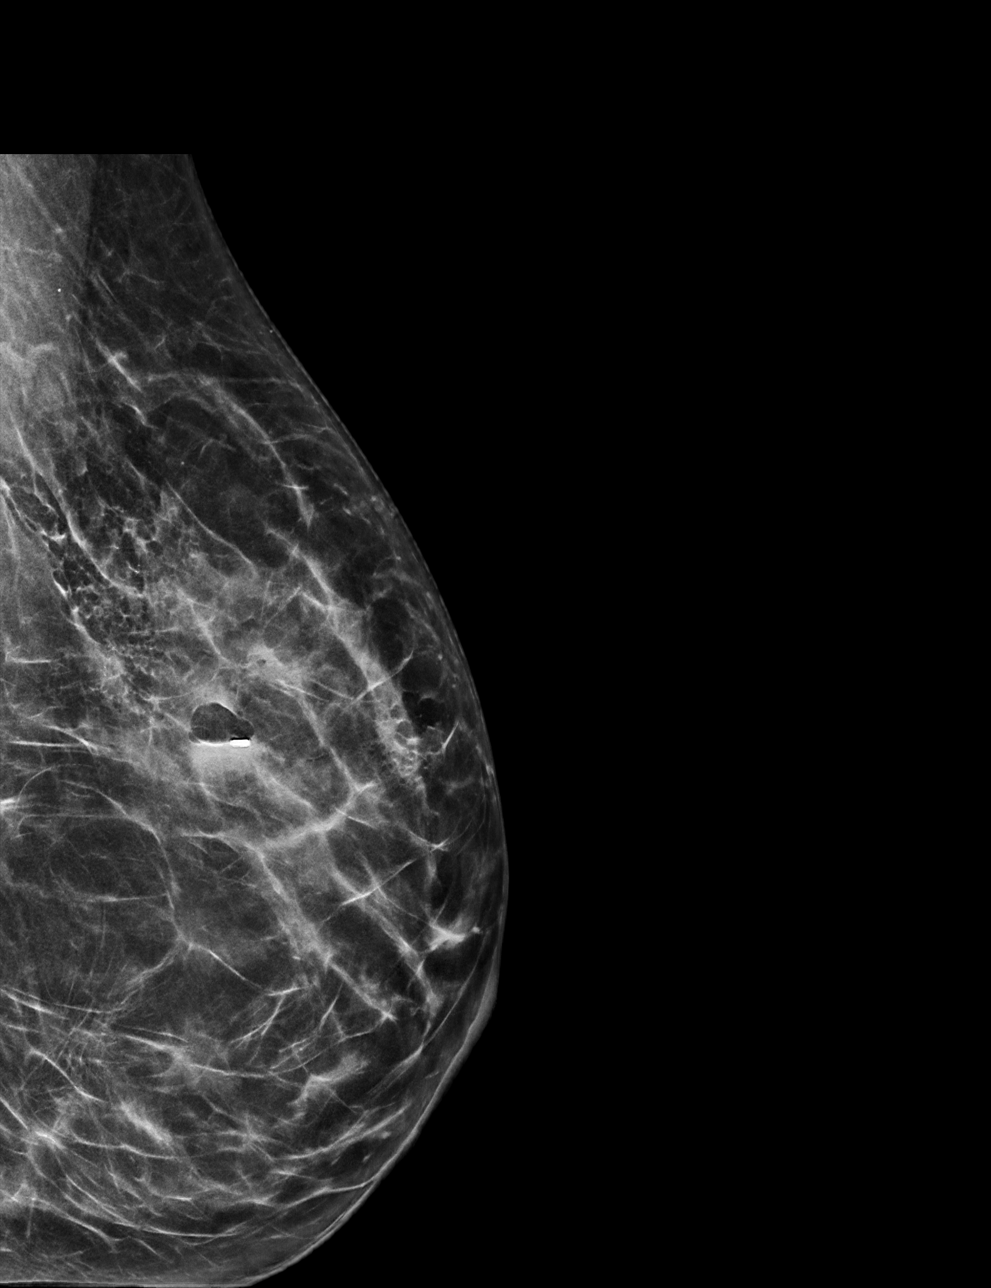

[L ML tomo · tomo slice 32/63.0]
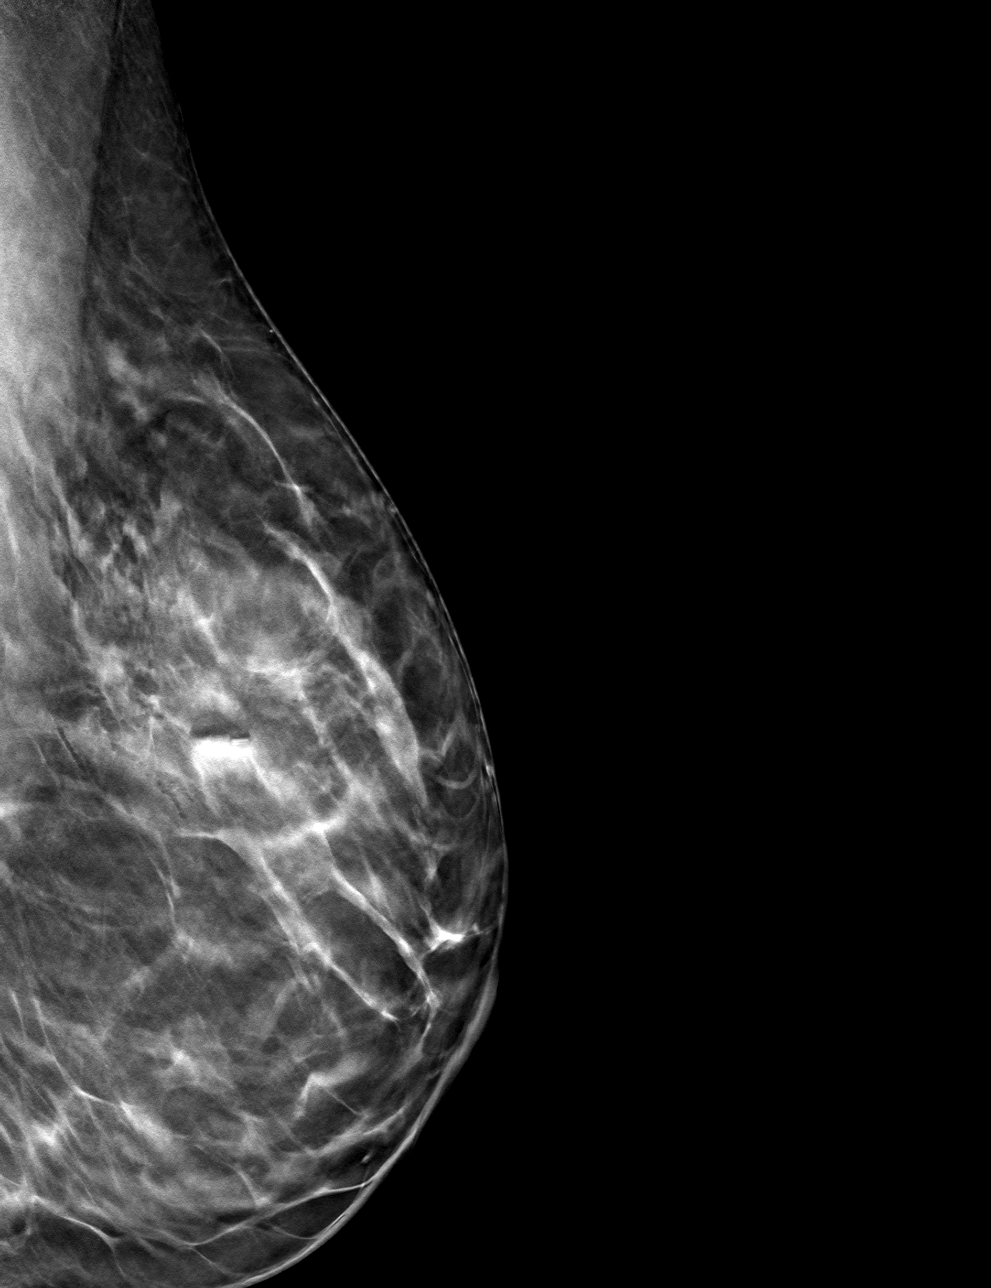

[L CC tomo · tomo slice 29/57.0]
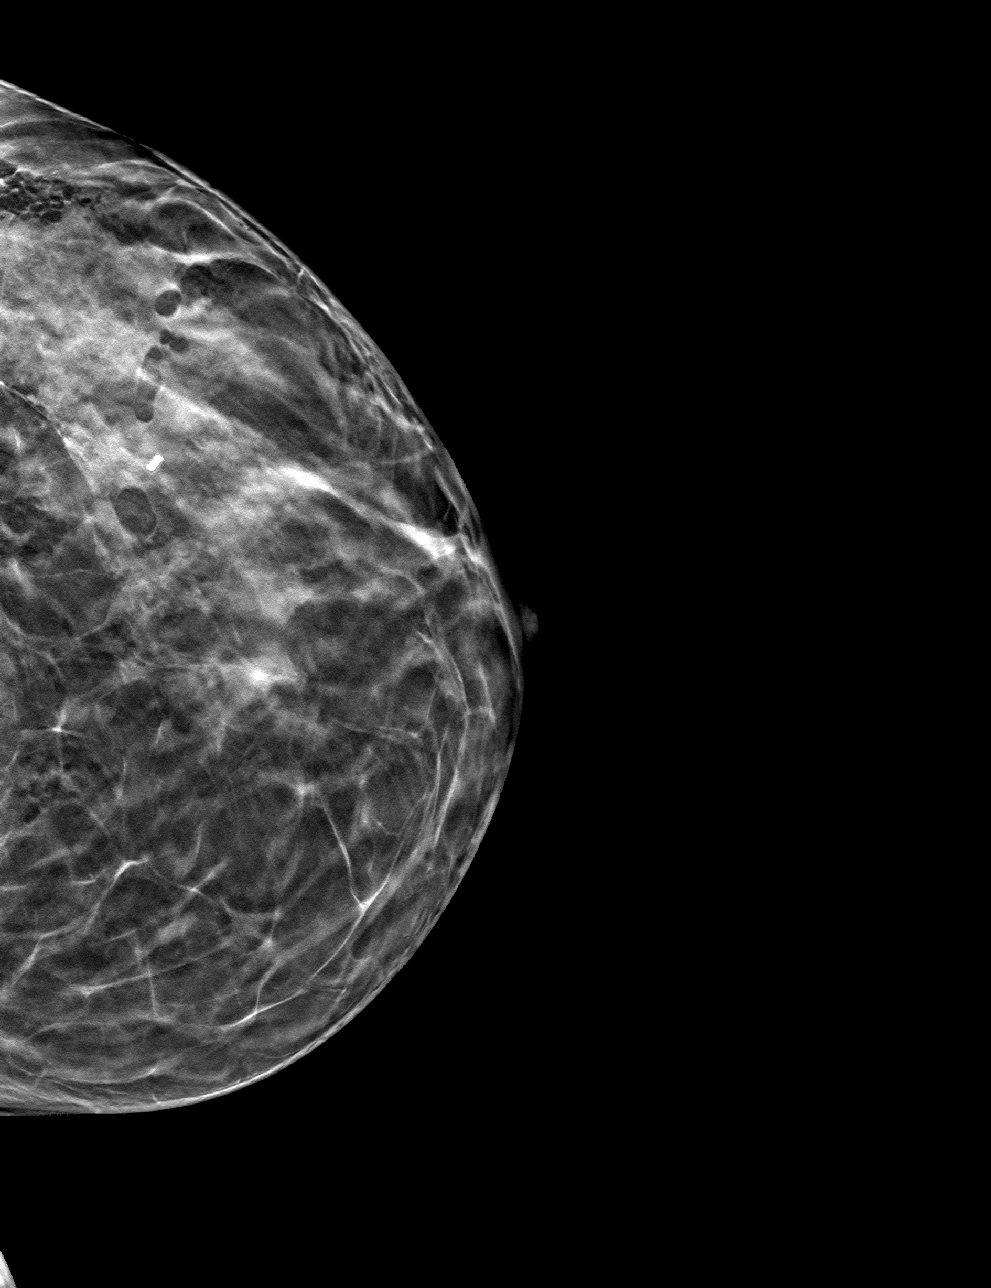

[4 of 12 positions shown; findings below may reference images not displayed]

FINDINGS: Mammographic images were obtained following MRI guided biopsy of the
bilateral breasts. All 3 post biopsy marking clips are in the
expected locations in the upper-outer quadrants of the bilateral
breasts.
IMPRESSION: Appropriate positioning of all 3 post biopsy marking clips in the
bilateral breasts.

Final Assessment: Post Procedure Mammograms for Marker Placement

## 2020-05-13 IMAGING — MG MM BREAST LOCALIZATION CLIP
4 series · 4 of 12 positions shown · non-contrast
Comparison: Previous exam(s).

CLINICAL DATA: 48-year-old female status post bilateral MRI guided
biopsies.

EXAM:
DIAGNOSTIC BILATERAL MAMMOGRAM POST MRI BIOPSY

[R CC synth-2D]
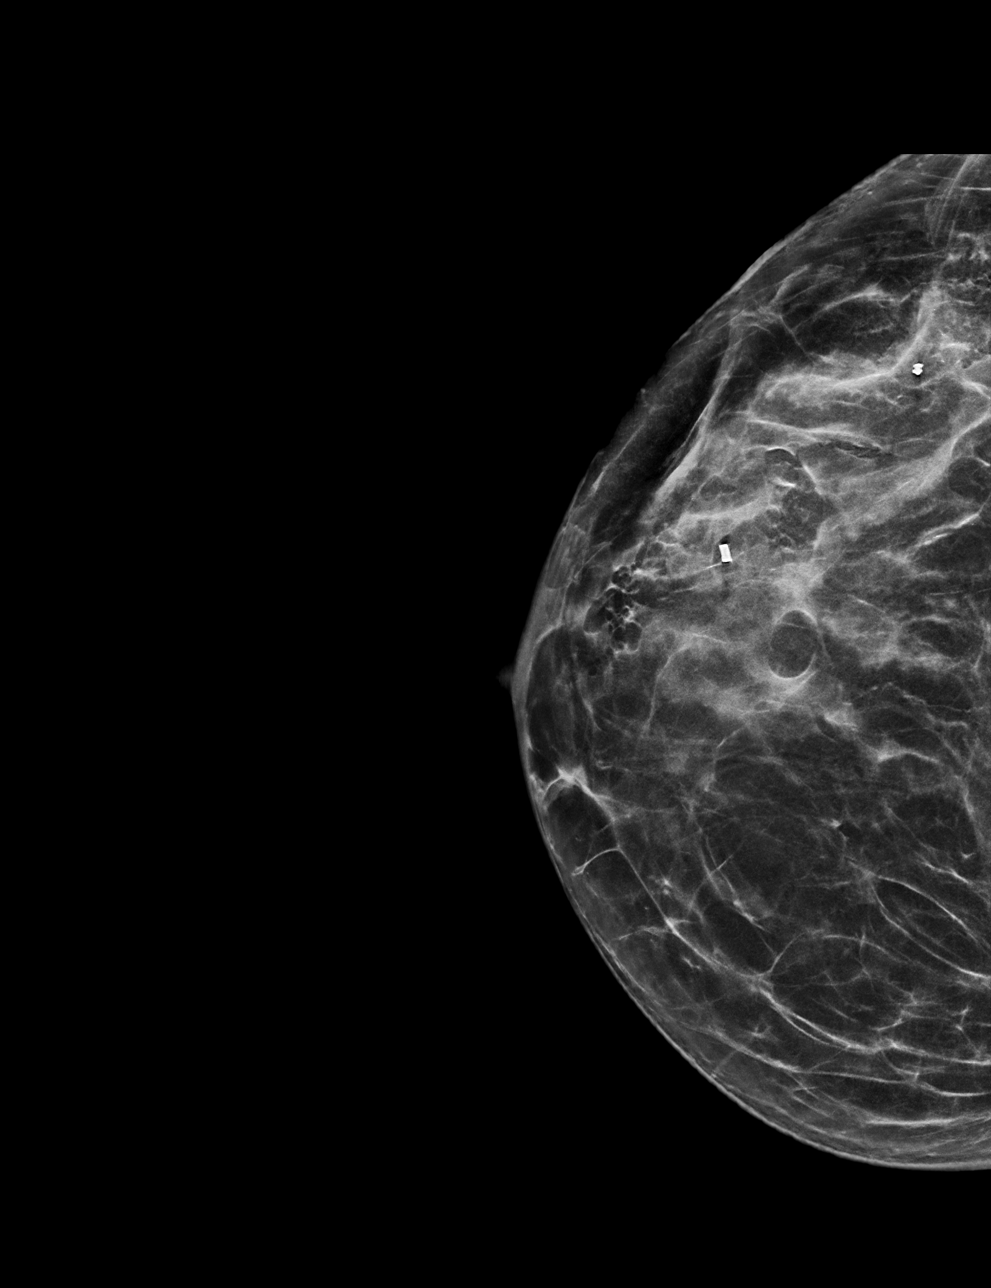

[R ML synth-2D]
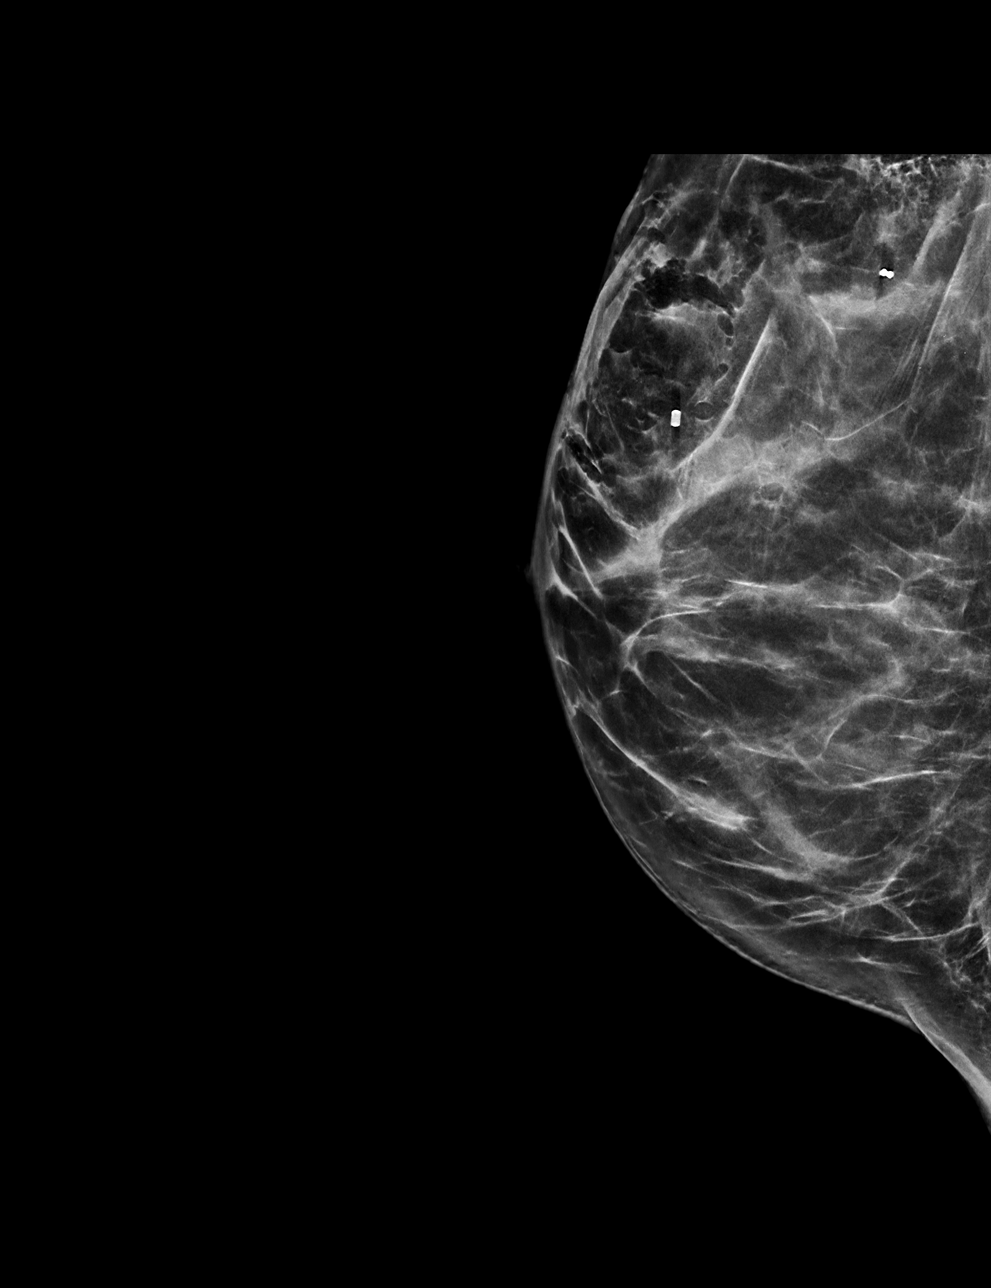

[R CC tomo · tomo slice 29/56.0]
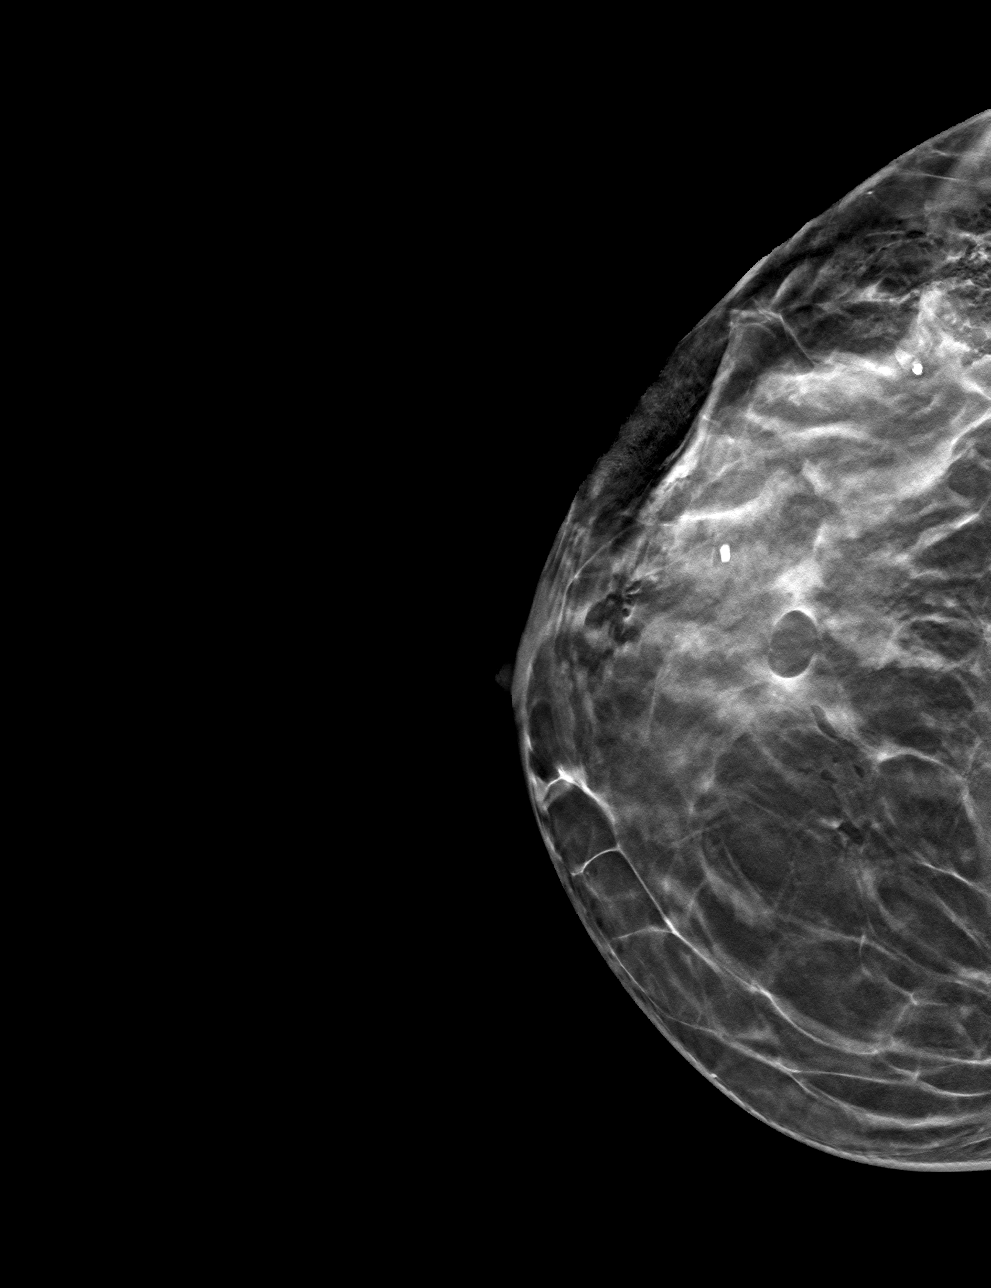

[R ML tomo · tomo slice 32/63.0]
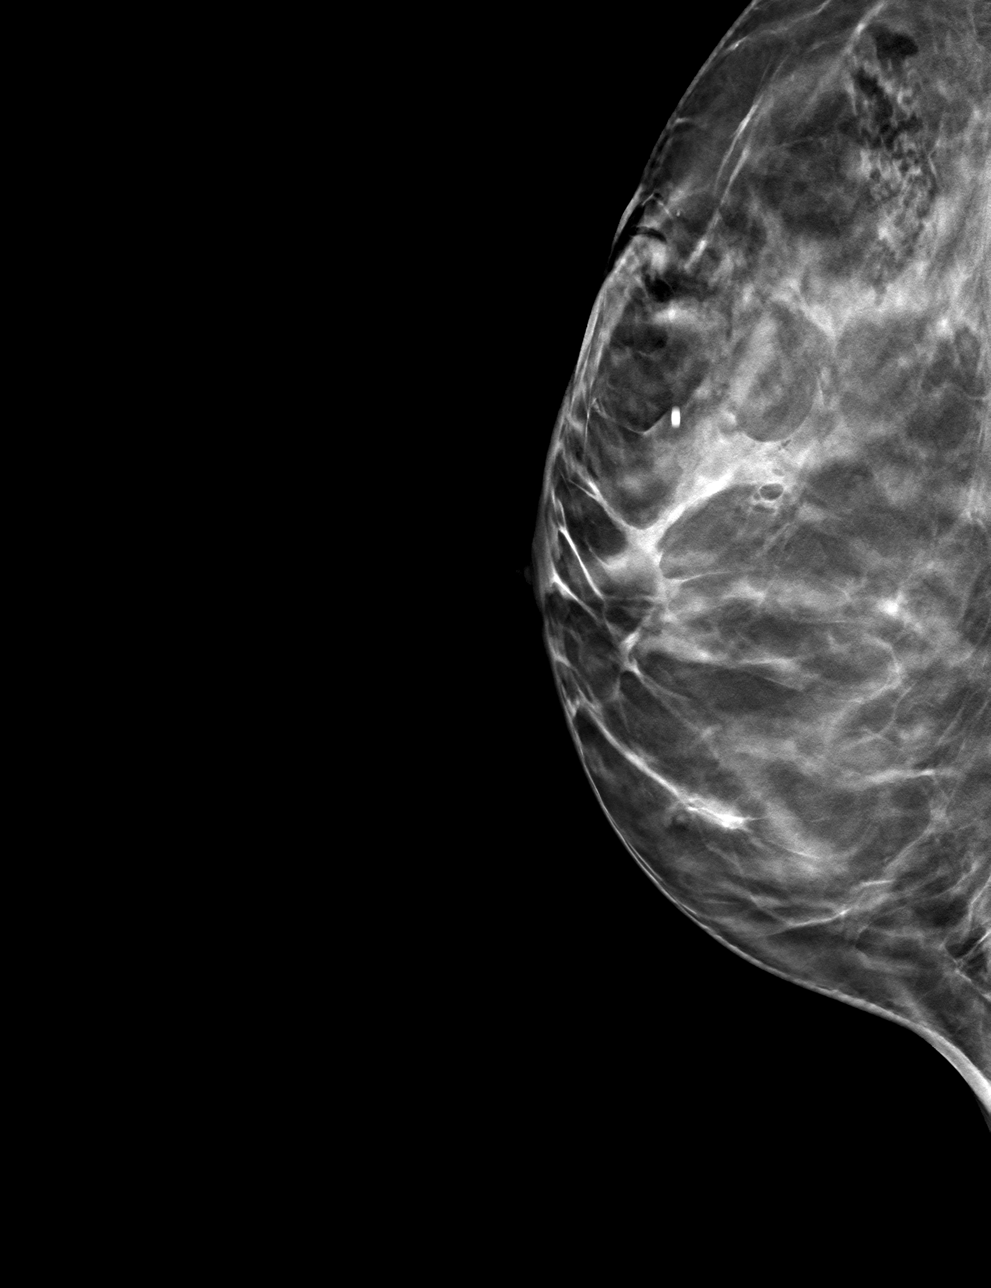

[4 of 12 positions shown; findings below may reference images not displayed]

FINDINGS: Mammographic images were obtained following MRI guided biopsy of the
bilateral breasts. All 3 post biopsy marking clips are in the
expected locations in the upper-outer quadrants of the bilateral
breasts.
IMPRESSION: Appropriate positioning of all 3 post biopsy marking clips in the
bilateral breasts.

Final Assessment: Post Procedure Mammograms for Marker Placement

## 2020-05-13 IMAGING — MR MR BREAST BX W/ LOC DEV 1ST LEASION IMAGE BX SPEC MR GUIDE*R*
8 of 12 series · 31 of 48 positions shown · IV contrast (7 ml Gadavist)
Comparison: Previous exams.
COMPARISON: Previous exams.

Addendum:
CLINICAL DATA: Three area MRI guided biopsy to include
anterior-posterior extent of right breast non-mass enhancement and a
4 mm enhancing left breast focus.

EXAM:
MRI GUIDED CORE NEEDLE BIOPSY OF THE bilateral BREAST x3
TECHNIQUE: Multiplanar, multisequence MR imaging of the bilateral breast was
performed both before and after administration of intravenous
contrast.
CONTRAST:  7mL GADAVIST GADOBUTROL 1 MMOL/ML IV SOLN

[Series 2: fiducial bilateral · sagittal · 2.0mm · 1.33mm/px · 4 of 128 slices shown]
[im 1/128]
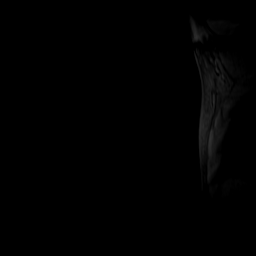
[im 43/128]
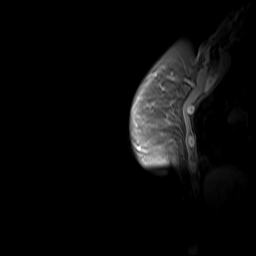
[im 85/128]
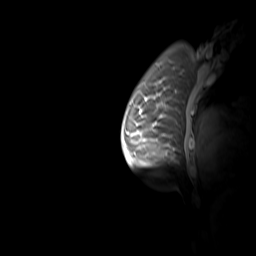
[im 128/128]
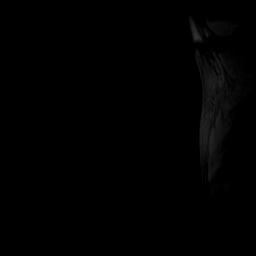

[Series 3: dynamic pre · axial · non-contrast · 1.3mm · 0.73mm/px · z∈[-107,+121]mm · 4 of 176 slices shown]
[im 1/176]
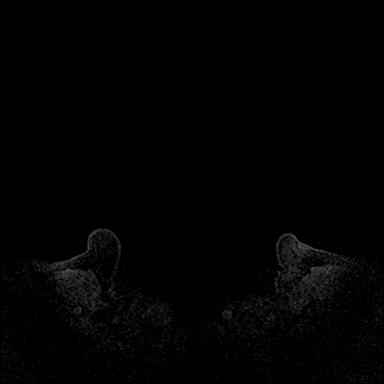
[im 59/176]
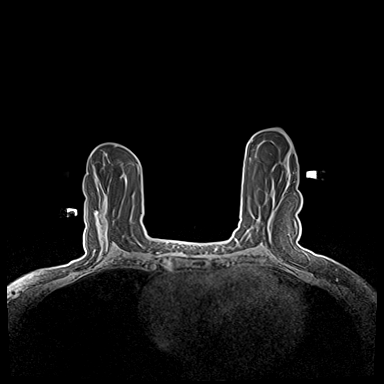
[im 117/176]
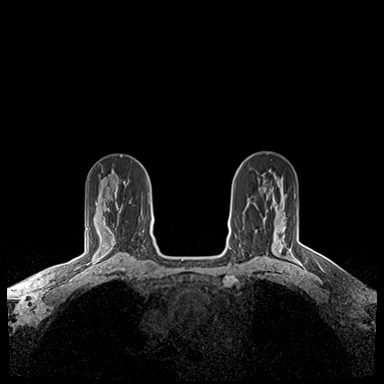
[im 176/176]
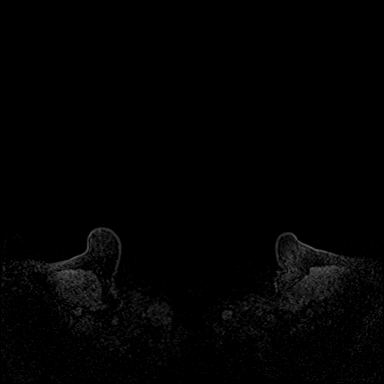

[Series 4: dynamic post 20 · axial · 1.3mm · 0.73mm/px · z∈[-107,+121]mm · 4 of 176 slices shown (1 of 2)]
[im 1/176]
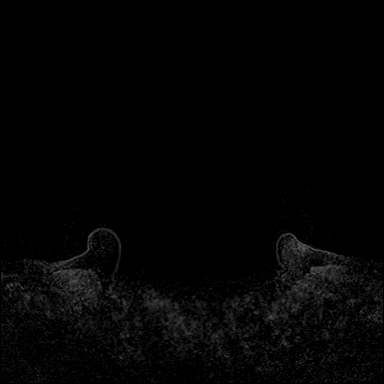
[im 59/176]
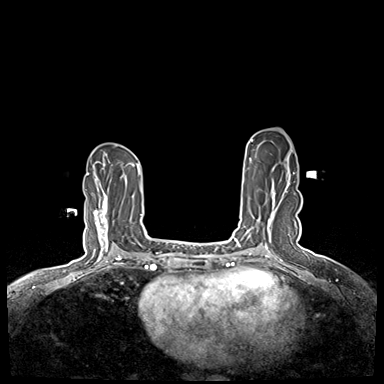
[im 117/176]
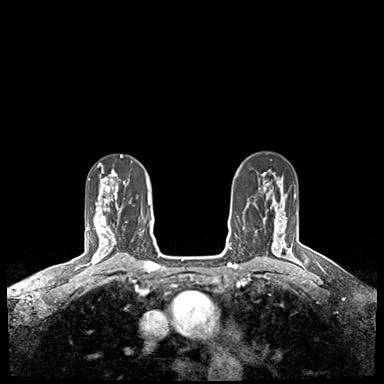
[im 176/176]
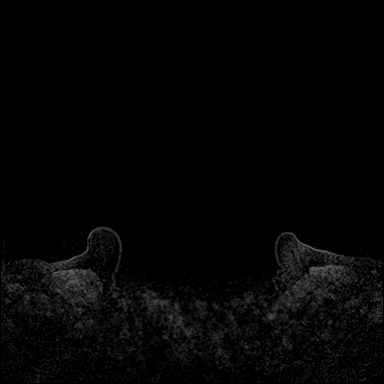

[Series 5: dynamic post 20 · axial · 1.3mm · 0.73mm/px · z∈[-107,+121]mm · 4 of 176 slices shown (2 of 2)]
[im 1/176]
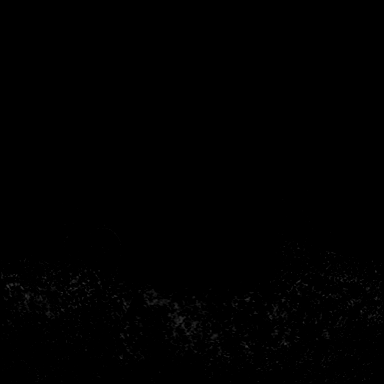
[im 59/176]
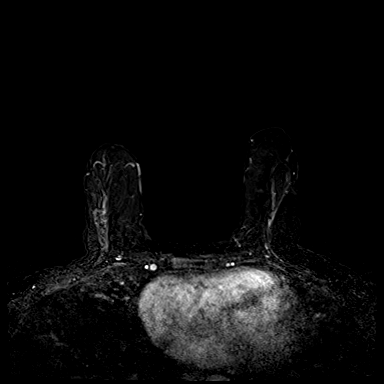
[im 117/176]
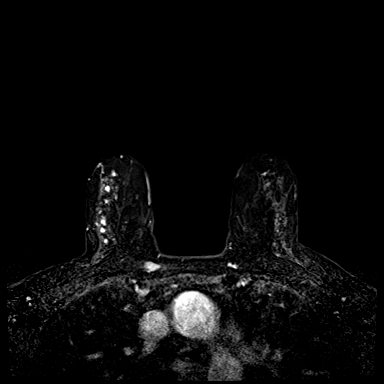
[im 176/176]
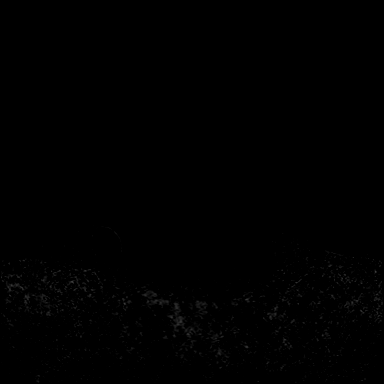

[Series 6: dynamic post 3 · axial · 1.3mm · 0.73mm/px · z∈[-107,+121]mm · 4 of 176 slices shown (1 of 2)]
[im 1/176]
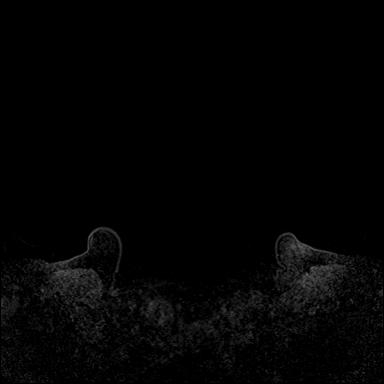
[im 59/176]
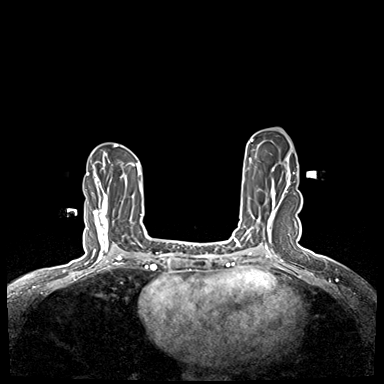
[im 117/176]
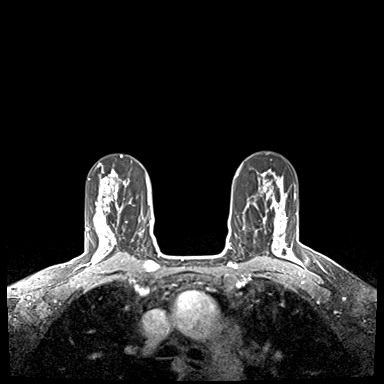
[im 176/176]
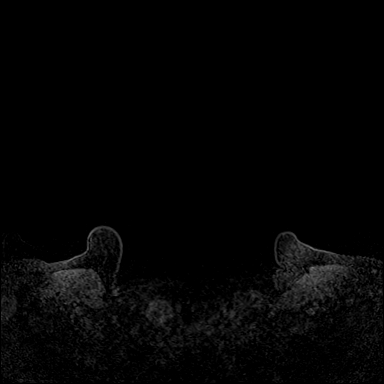

[Series 7: dynamic post 3 · axial · 1.3mm · 0.73mm/px · z∈[-107,+121]mm · 4 of 176 slices shown (2 of 2)]
[im 1/176]
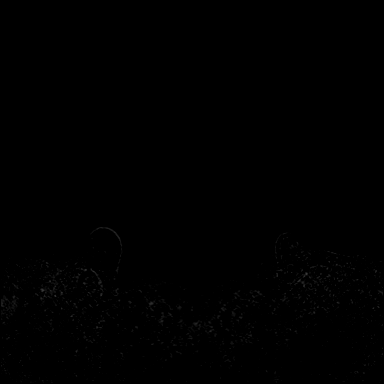
[im 59/176]
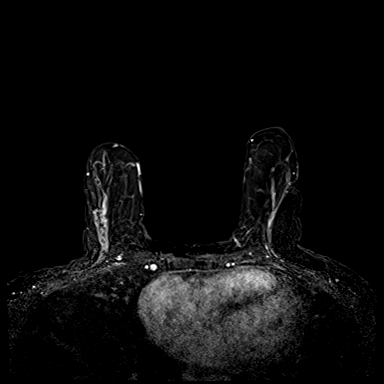
[im 117/176]
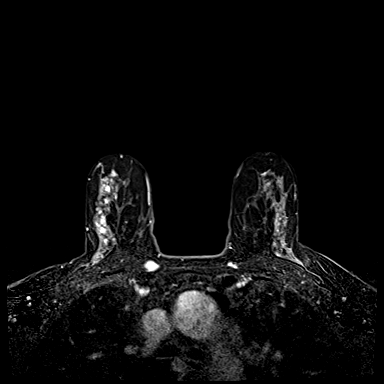
[im 176/176]
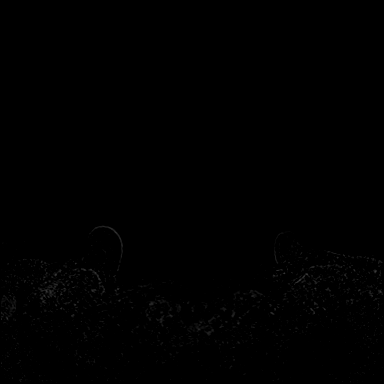

[Series 8: dynamic post 5 · axial · 1.3mm · 0.73mm/px · z∈[-107,+121]mm · 4 of 176 slices shown (1 of 2)]
[im 1/176]
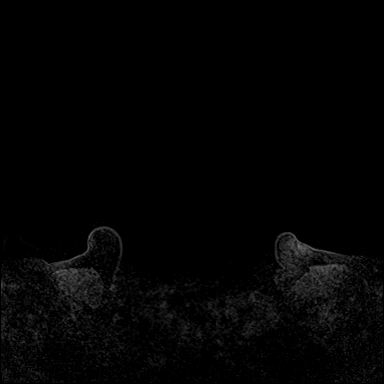
[im 59/176]
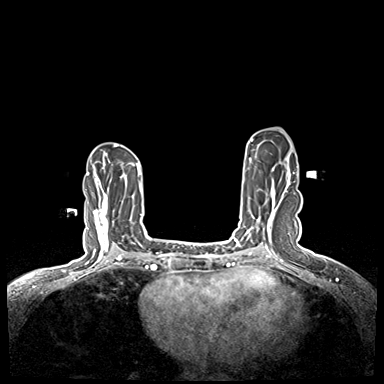
[im 117/176]
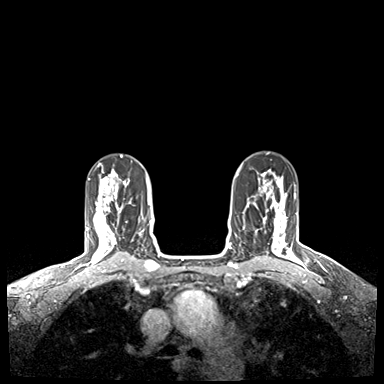
[im 176/176]
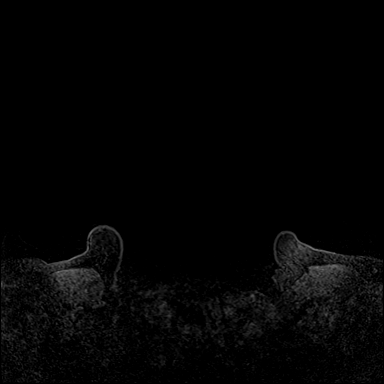

[Series 9: dynamic post 5 · axial · 1.3mm · 0.73mm/px · z∈[-107,+44]mm · 3 of 176 slices shown (2 of 2)]
[im 1/176]
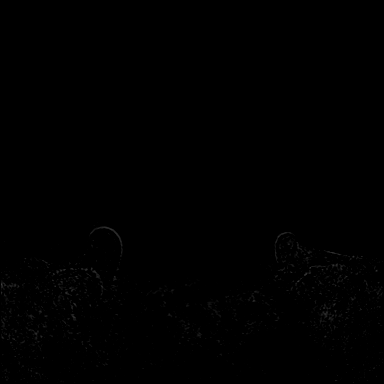
[im 59/176]
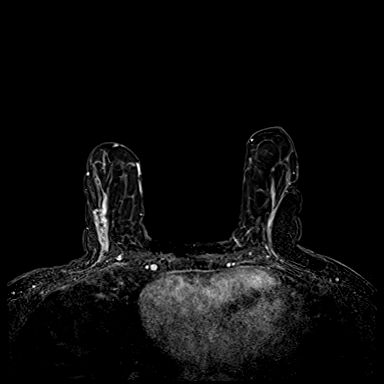
[im 117/176]
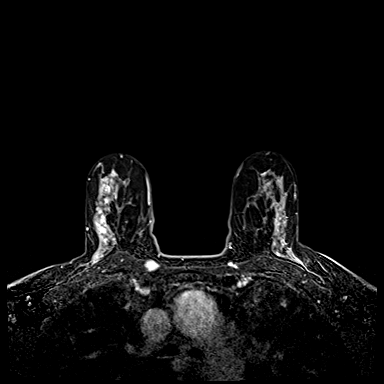

[31 of 48 positions shown; findings below may reference images not displayed]

FINDINGS: I met with the patient, and we discussed the procedure of MRI guided
biopsy, including risks, benefits, and alternatives. Specifically,
we discussed the risks of infection, bleeding, tissue injury, clip
migration, and inadequate sampling. Informed, written consent was
given. The usual time out protocol was performed immediately prior
to the procedure.

Using sterile technique, 1% Lidocaine, MRI guidance, and a 9 gauge
vacuum assisted device, biopsy was performed of the posterior extent
of non-mass enhancement in the upper-outer of the right breast using
a lateral approach. At the conclusion of the procedure, a barbell
tissue marker clip was deployed into the biopsy cavity.

Using sterile technique, 1% Lidocaine, MRI guidance, and a 9 gauge
vacuum assisted device, biopsy was performed of the anterior extent
of non-mass enhancement in the upper-outer of the right breast using
a lateral approach. At the conclusion of the procedure, a cylinder
tissue marker clip was deployed into the biopsy cavity.

Using sterile technique, 1% Lidocaine, MRI guidance, and a 9 gauge
vacuum assisted device, biopsy was performed of a 4 mm enhancing
focus in the upper-outer quadrant of the left breast using a lateral
approach. At the conclusion of the procedure, a cylinder tissue
marker clip was deployed into the biopsy cavity.

Follow-up 2-view mammogram was performed and dictated separately.
IMPRESSION: MRI guided biopsy of the bilateral breasts x3. No apparent
complications.

ADDENDUM:
Pathology revealed FIBROCYSTIC CHANGE AND ADENOSIS of the RIGHT
breast, upper outer quadrant, posterior (barbell clip). This was
found to be concordant by Dr. MARIZELLE.

Pathology revealed FIBROCYSTIC CHANGE, ADENOSIS, AND USUAL DUCTAL
HYPERPLASIA of the RIGHT breast, upper outer quadrant, anterior
(cylinder clip). This was found to be concordant by Dr. MARIZELLE
MARIZELLE.

Pathology revealed FIBROCYSTIC CHANGE, FIBROADENOMATOID CHANGE, AND
ADENOSIS, PSEUDOANGIOMATOUS STROMAL HYPERPLASIA (PASH) of the LEFT
breast, upper outer quadrant, posterior (cylinder clip). This was
found to be concordant by Dr. MARIZELLE.

Pathology results were discussed with the patient by telephone. The
patient reported doing well after the biopsies with tenderness,
bleeding and bruising at the sites. Post biopsy instructions and
care were reviewed and questions were answered. The patient was
encouraged to call The [REDACTED] for any
additional concerns. My direct phone number was provided.

The patient was instructed to return for a bilateral breast MRI in 6
months, per protocol, and to continue with annual mammography.

Pathology results reported by MARIZELLE, RN on [DATE].

*** End of Addendum ***
FINDINGS: I met with the patient, and we discussed the procedure of MRI guided
biopsy, including risks, benefits, and alternatives. Specifically,
we discussed the risks of infection, bleeding, tissue injury, clip
migration, and inadequate sampling. Informed, written consent was
given. The usual time out protocol was performed immediately prior
to the procedure.

Using sterile technique, 1% Lidocaine, MRI guidance, and a 9 gauge
vacuum assisted device, biopsy was performed of the posterior extent
of non-mass enhancement in the upper-outer of the right breast using
a lateral approach. At the conclusion of the procedure, a barbell
tissue marker clip was deployed into the biopsy cavity.

Using sterile technique, 1% Lidocaine, MRI guidance, and a 9 gauge
vacuum assisted device, biopsy was performed of the anterior extent
of non-mass enhancement in the upper-outer of the right breast using
a lateral approach. At the conclusion of the procedure, a cylinder
tissue marker clip was deployed into the biopsy cavity.

Using sterile technique, 1% Lidocaine, MRI guidance, and a 9 gauge
vacuum assisted device, biopsy was performed of a 4 mm enhancing
focus in the upper-outer quadrant of the left breast using a lateral
approach. At the conclusion of the procedure, a cylinder tissue
marker clip was deployed into the biopsy cavity.

Follow-up 2-view mammogram was performed and dictated separately.
IMPRESSION: MRI guided biopsy of the bilateral breasts x3. No apparent
complications.

## 2020-05-13 IMAGING — MR MR BREAST BX W/ LOC DEV EA ADD LESION IMAGE BX SPEC MR GUIDE*R*
16 of 24 series · 31 of 48 positions shown · IV contrast (gadavist)
Comparison: Previous exams.
COMPARISON: Previous exams.

Addendum:
CLINICAL DATA: Three area MRI guided biopsy to include
anterior-posterior extent of right breast non-mass enhancement and a
4 mm enhancing left breast focus.

EXAM:
MRI GUIDED CORE NEEDLE BIOPSY OF THE bilateral BREAST x3
TECHNIQUE: Multiplanar, multisequence MR imaging of the bilateral breast was
performed both before and after administration of intravenous
contrast.
CONTRAST:  7mL GADAVIST GADOBUTROL 1 MMOL/ML IV SOLN

[Series 2: fiducial bilateral · sagittal · 2.0mm · 1.33mm/px · 2 of 128 slices shown (1 of 2)]
[im 1/128]
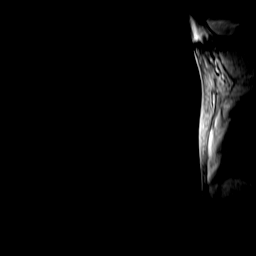
[im 128/128]
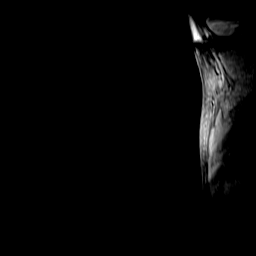

[Series 2: fiducial bilateral · sagittal · 2.0mm · 1.33mm/px · 2 of 128 slices shown (2 of 2)]
[im 1/128]
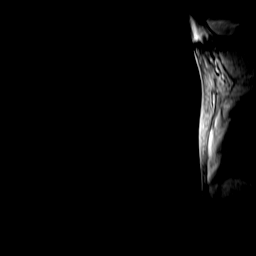
[im 128/128]
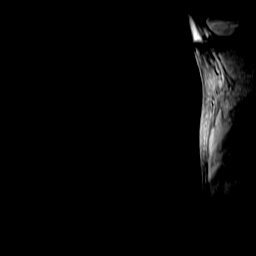

[Series 3: dynamic pre · axial · non-contrast · 1.3mm · 0.73mm/px · z∈[-107,+121]mm · 2 of 176 slices shown (1 of 2)]
[im 1/176]
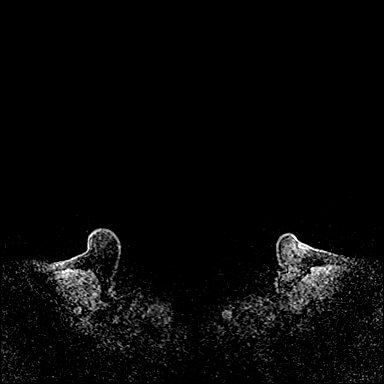
[im 176/176]
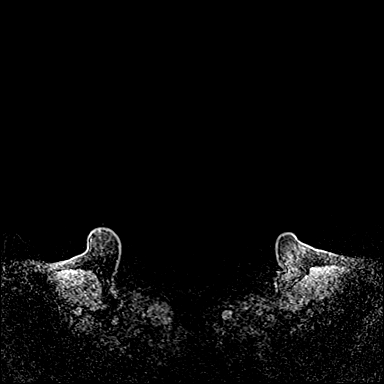

[Series 3: dynamic pre · axial · non-contrast · 1.3mm · 0.73mm/px · z∈[-107,+121]mm · 2 of 176 slices shown (2 of 2)]
[im 1/176]
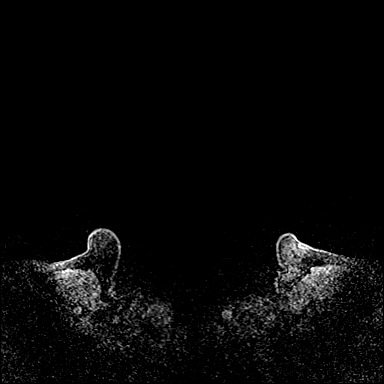
[im 176/176]
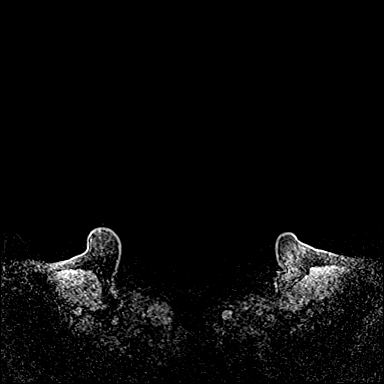

[Series 4: dynamic post 20 · axial · 1.3mm · 0.73mm/px · z∈[-107,+121]mm · 2 of 176 slices shown (1 of 4)]
[im 1/176]
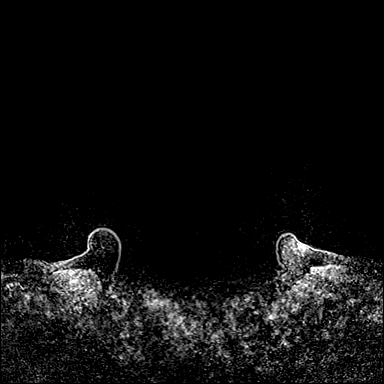
[im 176/176]
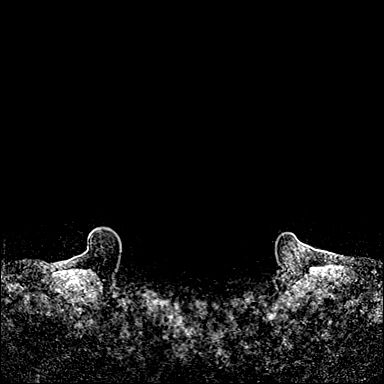

[Series 4: dynamic post 20 · axial · 1.3mm · 0.73mm/px · z∈[-107,+121]mm · 2 of 176 slices shown (2 of 4)]
[im 1/176]
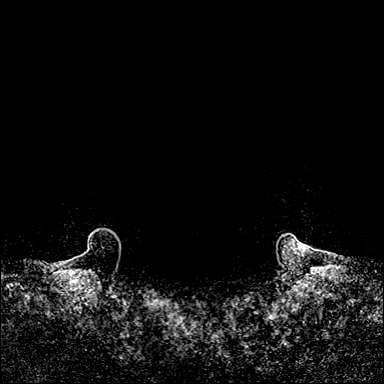
[im 176/176]
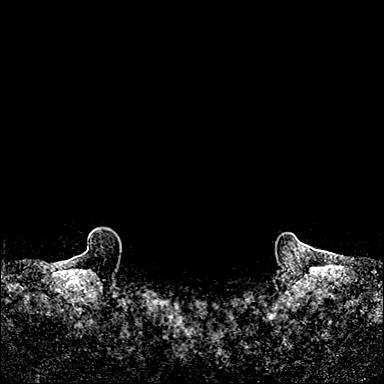

[Series 5: dynamic post 20 · axial · 1.3mm · 0.73mm/px · z∈[-107,+121]mm · 2 of 176 slices shown (3 of 4)]
[im 1/176]
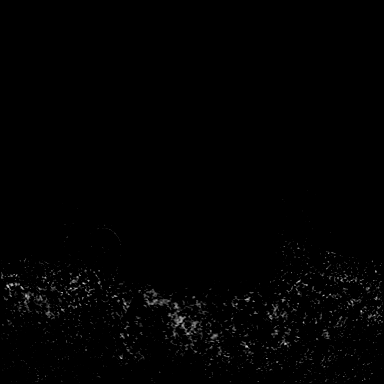
[im 176/176]
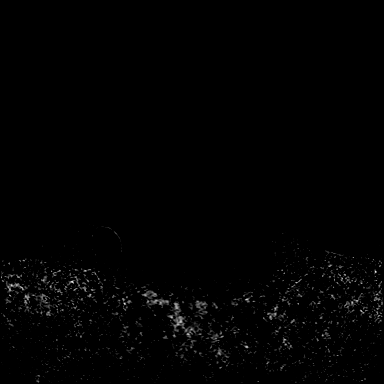

[Series 5: dynamic post 20 · axial · 1.3mm · 0.73mm/px · z∈[-107,+121]mm · 2 of 176 slices shown (4 of 4)]
[im 1/176]
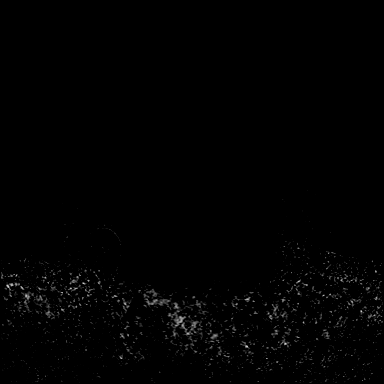
[im 176/176]
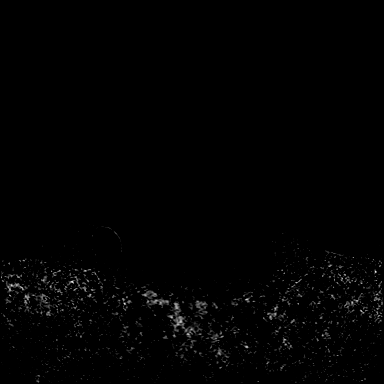

[Series 6: dynamic post 3 · axial · 1.3mm · 0.73mm/px · z∈[-107,+121]mm · 2 of 176 slices shown (1 of 4)]
[im 1/176]
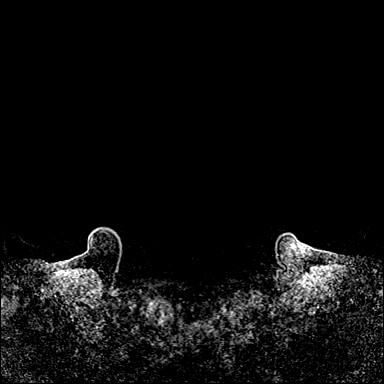
[im 176/176]
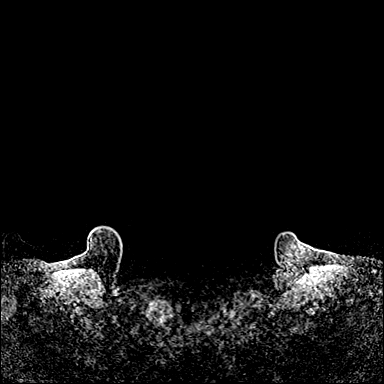

[Series 6: dynamic post 3 · axial · 1.3mm · 0.73mm/px · z∈[-107,+121]mm · 2 of 176 slices shown (2 of 4)]
[im 1/176]
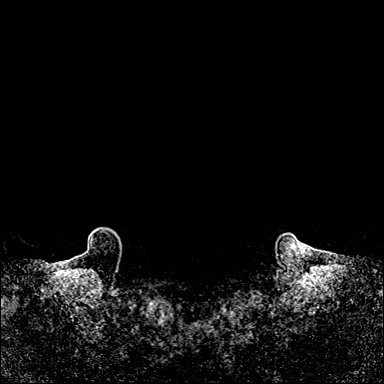
[im 176/176]
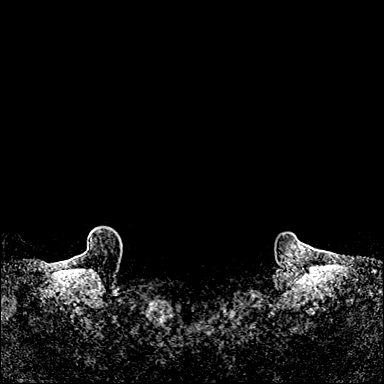

[Series 7: dynamic post 3 · axial · 1.3mm · 0.73mm/px · z∈[-107,+121]mm · 2 of 176 slices shown (3 of 4)]
[im 1/176]
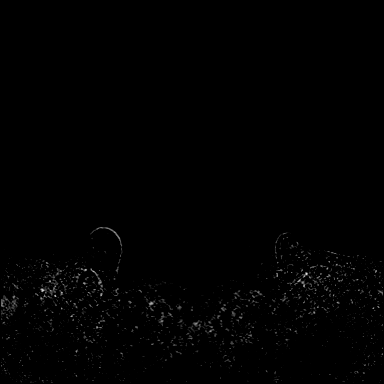
[im 176/176]
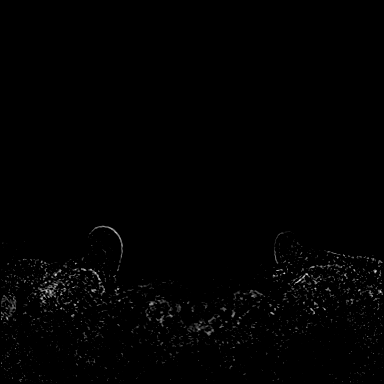

[Series 7: dynamic post 3 · axial · 1.3mm · 0.73mm/px · z∈[-107,+121]mm · 2 of 176 slices shown (4 of 4)]
[im 1/176]
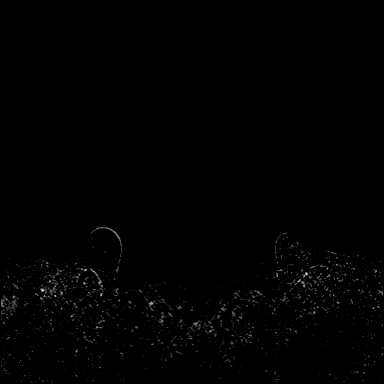
[im 176/176]
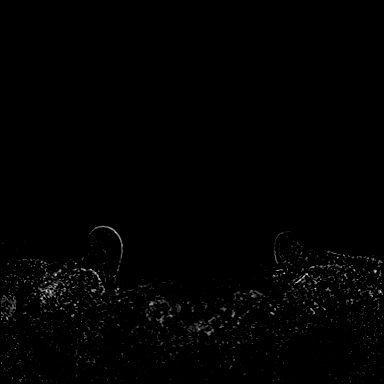

[Series 8: dynamic post 5 · axial · 1.3mm · 0.73mm/px · z∈[-107,+121]mm · 2 of 176 slices shown (1 of 4)]
[im 1/176]
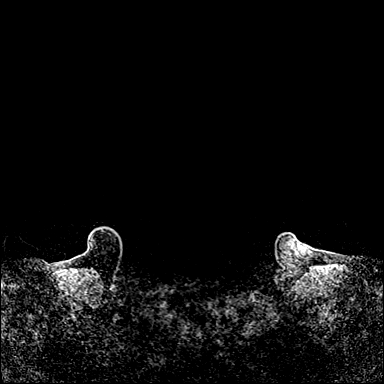
[im 176/176]
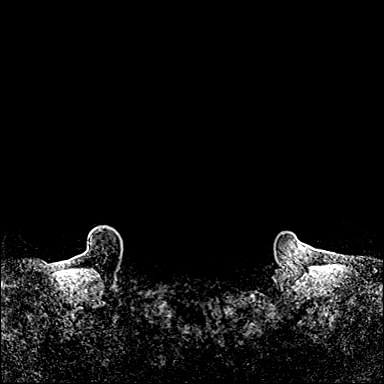

[Series 8: dynamic post 5 · axial · 1.3mm · 0.73mm/px · z∈[-107,+121]mm · 2 of 176 slices shown (2 of 4)]
[im 1/176]
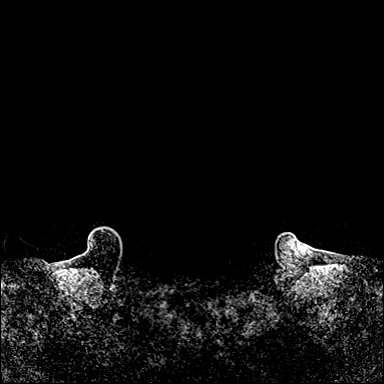
[im 176/176]
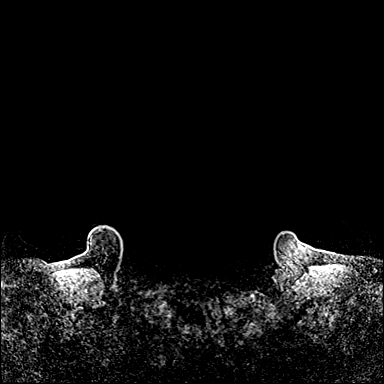

[Series 9: dynamic post 5 · axial · 1.3mm · 0.73mm/px · z∈[-107,+121]mm · 2 of 176 slices shown (3 of 4)]
[im 1/176]
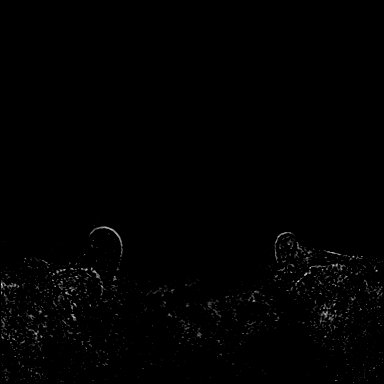
[im 176/176]
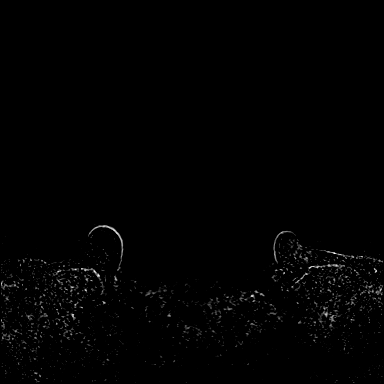

[Series 9: dynamic post 5 · axial · 1.3mm · 0.73mm/px · 1 of 176 slices shown (4 of 4)]
[im 1/176]
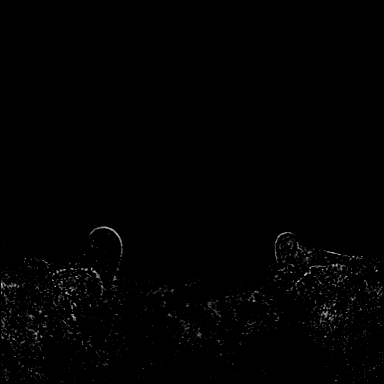

[31 of 48 positions shown; findings below may reference images not displayed]

FINDINGS: I met with the patient, and we discussed the procedure of MRI guided
biopsy, including risks, benefits, and alternatives. Specifically,
we discussed the risks of infection, bleeding, tissue injury, clip
migration, and inadequate sampling. Informed, written consent was
given. The usual time out protocol was performed immediately prior
to the procedure.

Using sterile technique, 1% Lidocaine, MRI guidance, and a 9 gauge
vacuum assisted device, biopsy was performed of the posterior extent
of non-mass enhancement in the upper-outer of the right breast using
a lateral approach. At the conclusion of the procedure, a barbell
tissue marker clip was deployed into the biopsy cavity.

Using sterile technique, 1% Lidocaine, MRI guidance, and a 9 gauge
vacuum assisted device, biopsy was performed of the anterior extent
of non-mass enhancement in the upper-outer of the right breast using
a lateral approach. At the conclusion of the procedure, a cylinder
tissue marker clip was deployed into the biopsy cavity.

Using sterile technique, 1% Lidocaine, MRI guidance, and a 9 gauge
vacuum assisted device, biopsy was performed of a 4 mm enhancing
focus in the upper-outer quadrant of the left breast using a lateral
approach. At the conclusion of the procedure, a cylinder tissue
marker clip was deployed into the biopsy cavity.

Follow-up 2-view mammogram was performed and dictated separately.
IMPRESSION: MRI guided biopsy of the bilateral breasts x3. No apparent
complications.

ADDENDUM:
Pathology revealed FIBROCYSTIC CHANGE AND ADENOSIS of the RIGHT
breast, upper outer quadrant, posterior (barbell clip). This was
found to be concordant by Dr. MARIZELLE.

Pathology revealed FIBROCYSTIC CHANGE, ADENOSIS, AND USUAL DUCTAL
HYPERPLASIA of the RIGHT breast, upper outer quadrant, anterior
(cylinder clip). This was found to be concordant by Dr. MARIZELLE
MARIZELLE.

Pathology revealed FIBROCYSTIC CHANGE, FIBROADENOMATOID CHANGE, AND
ADENOSIS, PSEUDOANGIOMATOUS STROMAL HYPERPLASIA (PASH) of the LEFT
breast, upper outer quadrant, posterior (cylinder clip). This was
found to be concordant by Dr. MARIZELLE.

Pathology results were discussed with the patient by telephone. The
patient reported doing well after the biopsies with tenderness,
bleeding and bruising at the sites. Post biopsy instructions and
care were reviewed and questions were answered. The patient was
encouraged to call The [REDACTED] for any
additional concerns. My direct phone number was provided.

The patient was instructed to return for a bilateral breast MRI in 6
months, per protocol, and to continue with annual mammography.

Pathology results reported by MARIZELLE, RN on [DATE].

*** End of Addendum ***
FINDINGS: I met with the patient, and we discussed the procedure of MRI guided
biopsy, including risks, benefits, and alternatives. Specifically,
we discussed the risks of infection, bleeding, tissue injury, clip
migration, and inadequate sampling. Informed, written consent was
given. The usual time out protocol was performed immediately prior
to the procedure.

Using sterile technique, 1% Lidocaine, MRI guidance, and a 9 gauge
vacuum assisted device, biopsy was performed of the posterior extent
of non-mass enhancement in the upper-outer of the right breast using
a lateral approach. At the conclusion of the procedure, a barbell
tissue marker clip was deployed into the biopsy cavity.

Using sterile technique, 1% Lidocaine, MRI guidance, and a 9 gauge
vacuum assisted device, biopsy was performed of the anterior extent
of non-mass enhancement in the upper-outer of the right breast using
a lateral approach. At the conclusion of the procedure, a cylinder
tissue marker clip was deployed into the biopsy cavity.

Using sterile technique, 1% Lidocaine, MRI guidance, and a 9 gauge
vacuum assisted device, biopsy was performed of a 4 mm enhancing
focus in the upper-outer quadrant of the left breast using a lateral
approach. At the conclusion of the procedure, a cylinder tissue
marker clip was deployed into the biopsy cavity.

Follow-up 2-view mammogram was performed and dictated separately.
IMPRESSION: MRI guided biopsy of the bilateral breasts x3. No apparent
complications.

## 2020-05-13 MED ORDER — GADOBUTROL 1 MMOL/ML IV SOLN
7.0000 mL | Freq: Once | INTRAVENOUS | Status: AC | PRN
  Administered 2020-05-13: 7 mL via INTRAVENOUS

## 2020-05-15 ENCOUNTER — Other Ambulatory Visit: Payer: Self-pay | Admitting: Family

## 2020-05-15 DIAGNOSIS — N6310 Unspecified lump in the right breast, unspecified quadrant: Secondary | ICD-10-CM

## 2020-05-16 ENCOUNTER — Other Ambulatory Visit: Payer: Self-pay

## 2020-05-16 ENCOUNTER — Ambulatory Visit (INDEPENDENT_AMBULATORY_CARE_PROVIDER_SITE_OTHER): Payer: BC Managed Care – PPO

## 2020-05-16 ENCOUNTER — Telehealth: Payer: Self-pay | Admitting: Internal Medicine

## 2020-05-16 DIAGNOSIS — E538 Deficiency of other specified B group vitamins: Secondary | ICD-10-CM | POA: Diagnosis not present

## 2020-05-16 MED ORDER — CYANOCOBALAMIN 1000 MCG/ML IJ SOLN
1000.0000 ug | Freq: Once | INTRAMUSCULAR | Status: AC
  Administered 2020-05-16: 1000 ug via INTRAMUSCULAR

## 2020-05-16 NOTE — Telephone Encounter (Signed)
lft vm for pt to call ofc to sch MRI. Please give pt number to sch at [phone] press option 3 then 2.

## 2020-05-16 NOTE — Progress Notes (Signed)
Patient presented for B 12 injection to Left deltoid, Patient voiced no concerns nor showed any signs of distress during injection. 

## 2020-06-18 ENCOUNTER — Other Ambulatory Visit: Payer: Self-pay

## 2020-06-18 ENCOUNTER — Ambulatory Visit: Payer: BC Managed Care – PPO

## 2020-06-18 ENCOUNTER — Ambulatory Visit (INDEPENDENT_AMBULATORY_CARE_PROVIDER_SITE_OTHER): Payer: BC Managed Care – PPO

## 2020-06-18 DIAGNOSIS — E538 Deficiency of other specified B group vitamins: Secondary | ICD-10-CM

## 2020-06-18 DIAGNOSIS — E559 Vitamin D deficiency, unspecified: Secondary | ICD-10-CM

## 2020-06-18 MED ADMIN — Cyanocobalamin Inj 1000 MCG/ML: 1000 ug | INTRAMUSCULAR | NDC 70069000501

## 2020-06-18 NOTE — Progress Notes (Signed)
Patient presented for B 12 injection to left deltoid, patient voiced no concerns nor showed any signs of distress during injection. 

## 2020-07-18 ENCOUNTER — Ambulatory Visit (INDEPENDENT_AMBULATORY_CARE_PROVIDER_SITE_OTHER): Payer: BC Managed Care – PPO

## 2020-07-18 ENCOUNTER — Other Ambulatory Visit: Payer: Self-pay

## 2020-07-18 DIAGNOSIS — E538 Deficiency of other specified B group vitamins: Secondary | ICD-10-CM

## 2020-07-18 MED ORDER — CYANOCOBALAMIN 1000 MCG/ML IJ SOLN
1000.0000 ug | Freq: Once | INTRAMUSCULAR | Status: AC
  Administered 2020-07-18: 1000 ug via INTRAMUSCULAR

## 2020-07-18 NOTE — Progress Notes (Signed)
Patient presented for B 12 injection to left deltoid, patient voiced no concerns nor showed any signs of distress during injection. 

## 2020-08-20 ENCOUNTER — Ambulatory Visit (INDEPENDENT_AMBULATORY_CARE_PROVIDER_SITE_OTHER): Payer: BC Managed Care – PPO

## 2020-08-20 ENCOUNTER — Other Ambulatory Visit: Payer: Self-pay

## 2020-08-20 DIAGNOSIS — E559 Vitamin D deficiency, unspecified: Secondary | ICD-10-CM | POA: Diagnosis not present

## 2020-08-20 DIAGNOSIS — E538 Deficiency of other specified B group vitamins: Secondary | ICD-10-CM | POA: Diagnosis not present

## 2020-08-20 MED ORDER — CYANOCOBALAMIN 1000 MCG/ML IJ SOLN
1000.0000 ug | Freq: Once | INTRAMUSCULAR | Status: AC
  Administered 2020-08-20: 1000 ug via INTRAMUSCULAR

## 2020-08-20 NOTE — Progress Notes (Signed)
Patient presented for B 12 injection to left deltoid, patient voiced no concerns nor showed any signs of distress during injection. 

## 2020-08-21 LAB — VITAMIN B12: Vitamin B-12: 528 pg/mL (ref 211–911)

## 2020-08-21 LAB — VITAMIN D 25 HYDROXY (VIT D DEFICIENCY, FRACTURES): VITD: 51.15 ng/mL (ref 30.00–100.00)

## 2020-11-04 ENCOUNTER — Ambulatory Visit
Admission: RE | Admit: 2020-11-04 | Discharge: 2020-11-04 | Disposition: A | Discharge status: Home / Self Care | Payer: BC Managed Care – PPO | Source: Ambulatory Visit | Attending: Family | Admitting: Family

## 2020-11-04 ENCOUNTER — Other Ambulatory Visit: Payer: Self-pay

## 2020-11-04 DIAGNOSIS — N6310 Unspecified lump in the right breast, unspecified quadrant: Secondary | ICD-10-CM

## 2020-11-04 IMAGING — MR MR BREAST BILAT WO/W CM
8 of 12 series · 32 of 48 positions shown · IV contrast (6 ml of gadavist)
Comparison: Breast MRI dated [DATE] and bilateral MRI-guided
breast biopsies dated [DATE].

CLINICAL DATA: Status post bilateral MRI-guided breast biopsies in
[DATE] revealing benign fibrocystic change at each site. Per
protocol, patient presents now for six-month follow-up breast MRI.

LABS:  Not performed at [REDACTED].
EXAM:
BILATERAL BREAST MRI WITH AND WITHOUT CONTRAST
TECHNIQUE: Multiplanar, multisequence MR images of both breasts were obtained
prior to and following the intravenous administration of 6 ml of
Gadavist

[Series 2: t2_tirm_tra ipat (a-p) · axial · 3.0mm · 0.70mm/px · 1 of 62 slices shown]
[im 1/62]
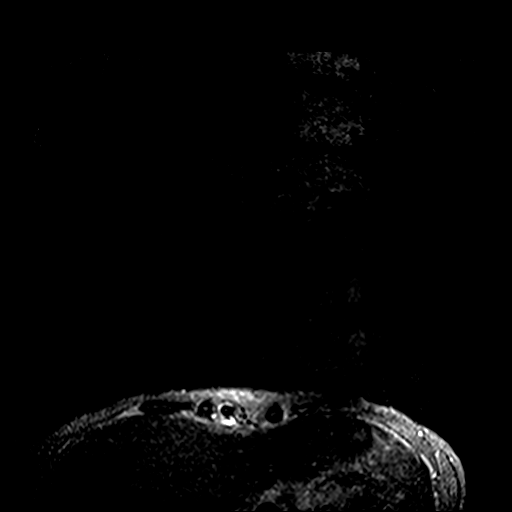

[Series 3: fl3d pre-cm no · axial · non-contrast · 1.2mm · 0.94mm/px · z∈[-87,+104]mm · 5 of 160 slices shown]
[im 1/160]
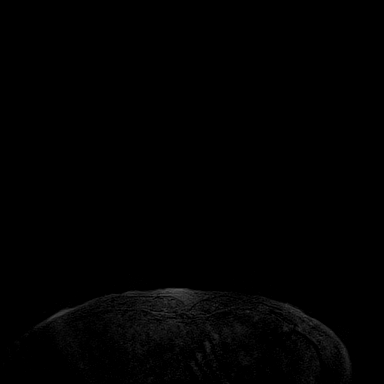
[im 40/160]
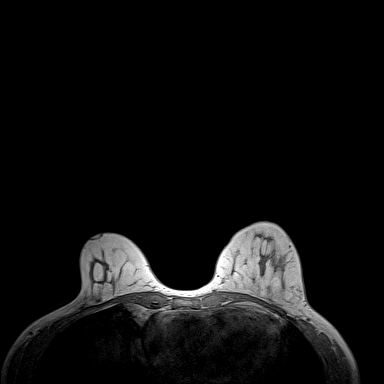
[im 80/160]
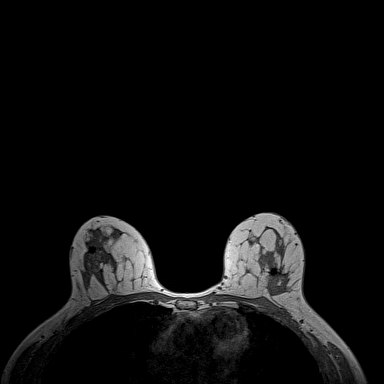
[im 120/160]
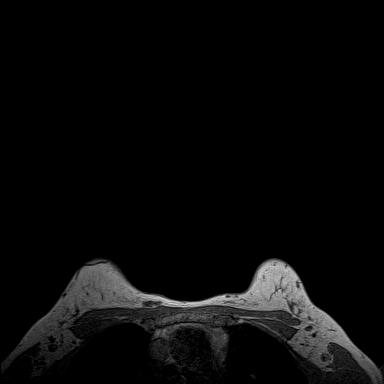
[im 160/160]
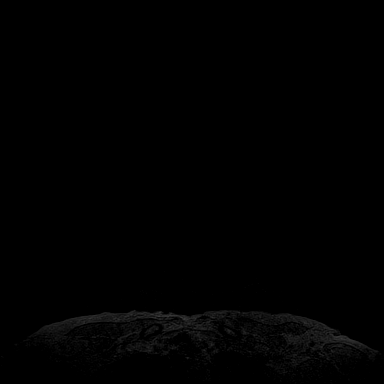

[Series 4: fl3d pre-cm · axial · non-contrast · 1.2mm · 0.94mm/px · z∈[-87,+104]mm · 5 of 160 slices shown]
[im 1/160]
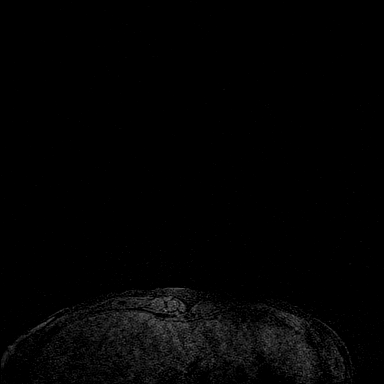
[im 40/160]
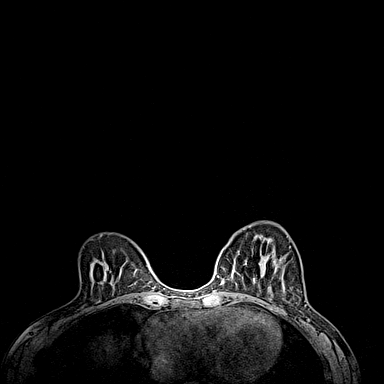
[im 80/160]
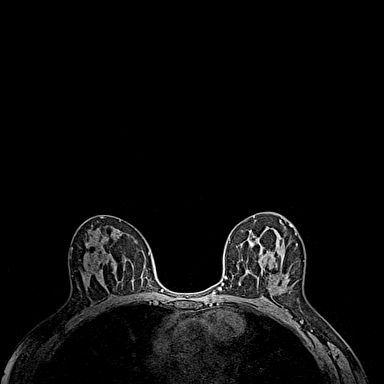
[im 120/160]
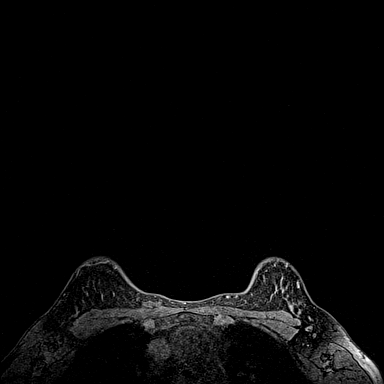
[im 160/160]
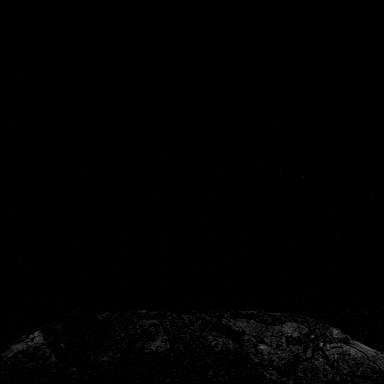

[Series 5: fl3d post-cm 20 · axial · 1.2mm · 0.94mm/px · z∈[-87,+104]mm · 5 of 160 slices shown (1 of 3)]
[im 1/160]
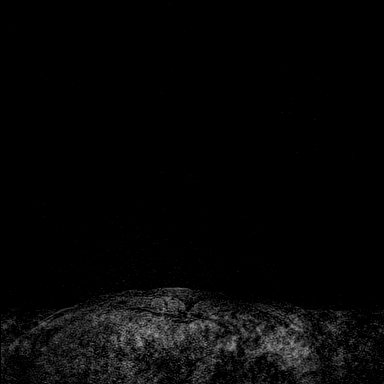
[im 40/160]
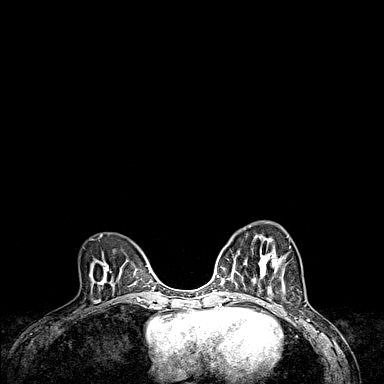
[im 80/160]
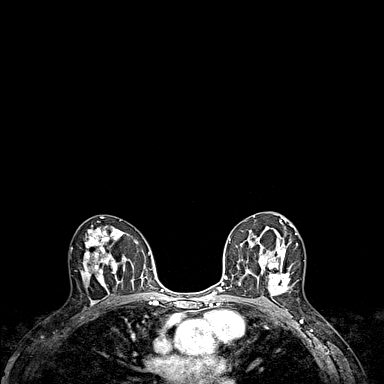
[im 120/160]
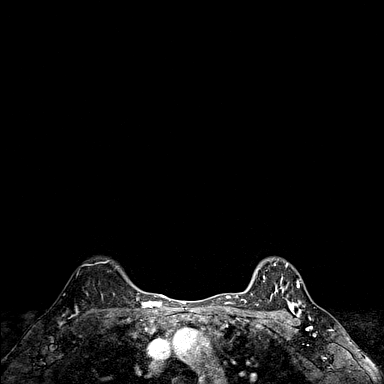
[im 160/160]
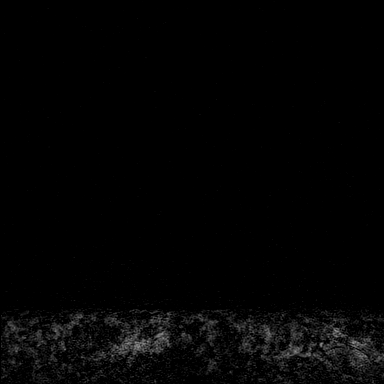

[Series 6: fl3d post-cm 20 · axial · 1.2mm · 0.94mm/px · z∈[-87,+104]mm · 5 of 160 slices shown (2 of 3)]
[im 1/160]
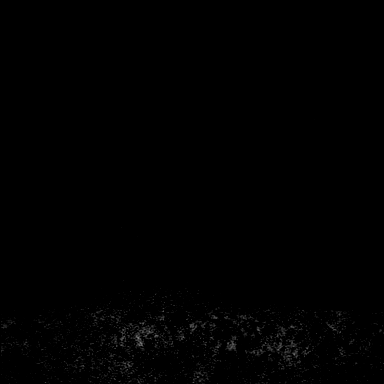
[im 40/160]
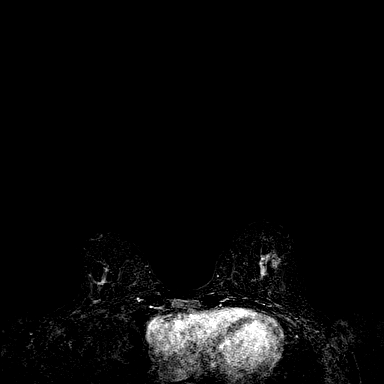
[im 80/160]
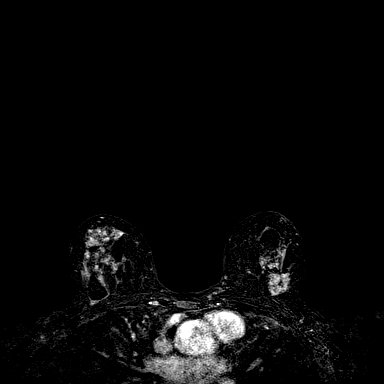
[im 120/160]
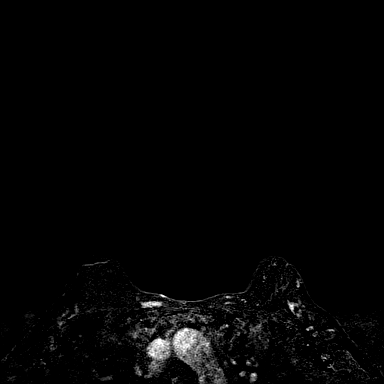
[im 160/160]
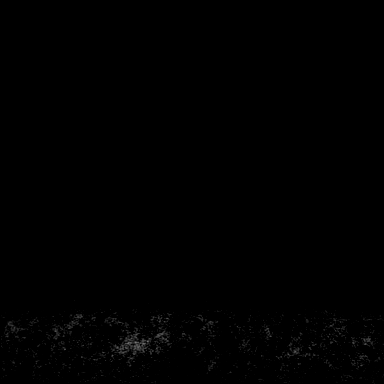

[Series 7: fl3d post-cm 20 · axial · 192.0mm · 0.94mm/px · 1 of 1 slices shown (3 of 3)]
[im 1/1]
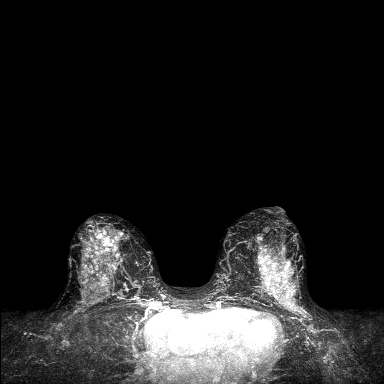

[Series 8: fl3d post-cm 3min · axial · 1.2mm · 0.94mm/px · z∈[-87,+104]mm · 6 of 160 slices shown]
[im 1/160]
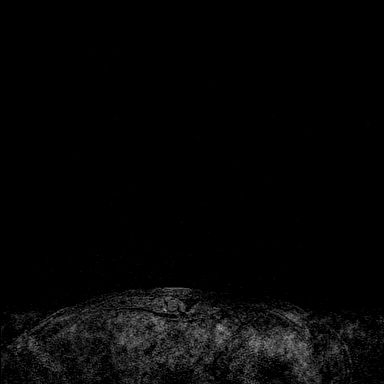
[im 32/160]
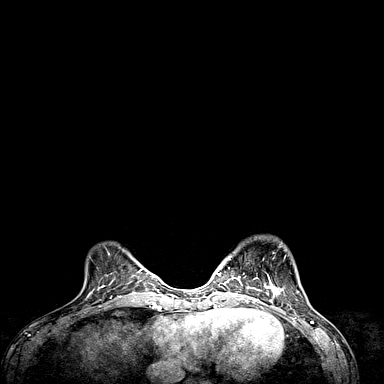
[im 64/160]
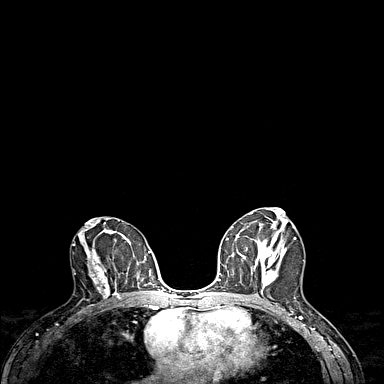
[im 96/160]
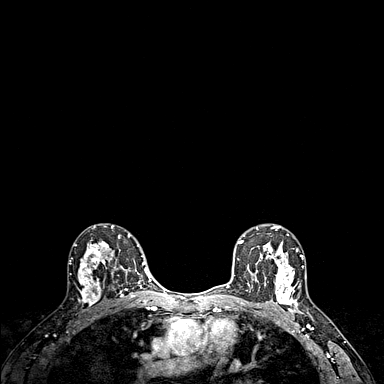
[im 128/160]
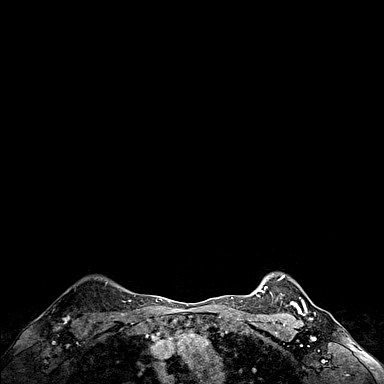
[im 160/160]
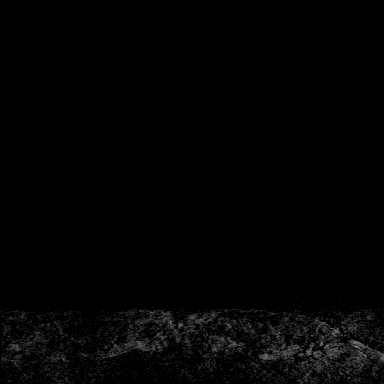

[Series 9: fl3d post-cm 3min_sub · axial · 1.2mm · 0.94mm/px · z∈[-87,+27]mm · 4 of 160 slices shown]
[im 1/160]
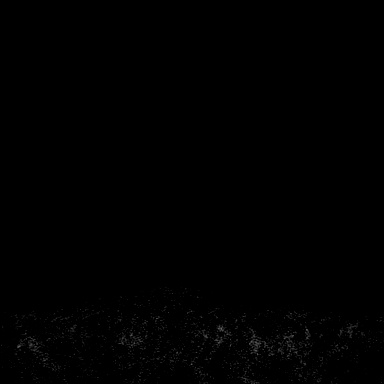
[im 32/160]
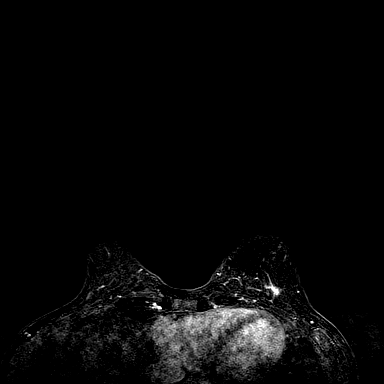
[im 64/160]
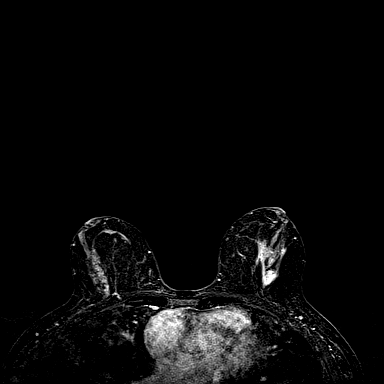
[im 96/160]
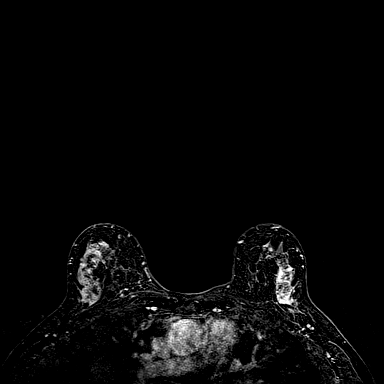

[32 of 48 positions shown; findings below may reference images not displayed]

Three-dimensional MR images were rendered by post-processing of the
original MR data on an independent workstation. The
three-dimensional MR images were interpreted, and findings are
reported in the following complete MRI report for this study. Three
dimensional images were evaluated at the independent interpreting
workstation using the DynaCAD thin client.
FINDINGS: Breast composition: c. Heterogeneous fibroglandular tissue.

Background parenchymal enhancement: Moderate to marked.

Right breast: No suspicious enhancing mass, non-mass enhancement or
secondary signs of malignancy. Two biopsy site markers within the
outer RIGHT breast, corresponding to benign biopsy sites.

Left breast: No suspicious enhancing mass, non-mass enhancement or
secondary signs of malignancy. Biopsy site marker within the outer
LEFT breast, corresponding to benign biopsy site.

Lymph nodes: No abnormal appearing lymph nodes.

Ancillary findings:  None.
IMPRESSION: No MRI evidence of malignancy within either breast.

RECOMMENDATION:
1. Annual screening mammograms. Next bilateral screening mammogram
will be due in [DATE].
2. Given patient's dense breasts, would consider the addition of
annual screening breast MRI to annual screening mammography. Per
[employer] guidelines, annual screening MRI of the
breasts is recommended if a risk assessment calculation for breast
cancer, preferably using the Tyrer-Cuzick or Gail model, measures
greater than 20%. If risk assessment calculation is less than 20%,
would consider abbreviated breast MRI for supplemental screening.

BI-RADS CATEGORY  2: Benign.

## 2020-11-04 MED ORDER — GADOBUTROL 1 MMOL/ML IV SOLN
6.0000 mL | Freq: Once | INTRAVENOUS | Status: AC | PRN
  Administered 2020-11-04: 6 mL via INTRAVENOUS

## 2020-11-06 ENCOUNTER — Encounter: Payer: Self-pay | Admitting: *Deleted

## 2020-11-06 ENCOUNTER — Other Ambulatory Visit: Payer: Self-pay | Admitting: Internal Medicine

## 2020-11-06 DIAGNOSIS — Z1231 Encounter for screening mammogram for malignant neoplasm of breast: Secondary | ICD-10-CM

## 2020-12-27 ENCOUNTER — Encounter: Payer: Self-pay | Admitting: Internal Medicine

## 2020-12-27 ENCOUNTER — Ambulatory Visit: Payer: BC Managed Care – PPO | Admitting: Internal Medicine

## 2020-12-27 ENCOUNTER — Telehealth: Payer: Self-pay | Admitting: Internal Medicine

## 2020-12-27 ENCOUNTER — Other Ambulatory Visit: Payer: Self-pay

## 2020-12-27 ENCOUNTER — Ambulatory Visit
Admission: RE | Admit: 2020-12-27 | Discharge: 2020-12-27 | Disposition: A | Discharge status: Home / Self Care | Payer: BC Managed Care – PPO | Attending: Internal Medicine | Admitting: Internal Medicine

## 2020-12-27 ENCOUNTER — Ambulatory Visit
Admission: RE | Admit: 2020-12-27 | Discharge: 2020-12-27 | Disposition: A | Discharge status: Home / Self Care | Payer: BC Managed Care – PPO | Source: Ambulatory Visit | Attending: Internal Medicine | Admitting: Internal Medicine

## 2020-12-27 VITALS — BP 124/76 | HR 76 | Temp 97.7°F | Ht 68.25 in | Wt 148.0 lb

## 2020-12-27 DIAGNOSIS — N6019 Diffuse cystic mastopathy of unspecified breast: Secondary | ICD-10-CM

## 2020-12-27 DIAGNOSIS — Z1389 Encounter for screening for other disorder: Secondary | ICD-10-CM

## 2020-12-27 DIAGNOSIS — Q6589 Other specified congenital deformities of hip: Secondary | ICD-10-CM

## 2020-12-27 DIAGNOSIS — Z0000 Encounter for general adult medical examination without abnormal findings: Secondary | ICD-10-CM

## 2020-12-27 DIAGNOSIS — M545 Low back pain, unspecified: Secondary | ICD-10-CM

## 2020-12-27 DIAGNOSIS — Z803 Family history of malignant neoplasm of breast: Secondary | ICD-10-CM

## 2020-12-27 DIAGNOSIS — Z1231 Encounter for screening mammogram for malignant neoplasm of breast: Secondary | ICD-10-CM

## 2020-12-27 DIAGNOSIS — E559 Vitamin D deficiency, unspecified: Secondary | ICD-10-CM

## 2020-12-27 DIAGNOSIS — Z13220 Encounter for screening for lipoid disorders: Secondary | ICD-10-CM

## 2020-12-27 DIAGNOSIS — Z1329 Encounter for screening for other suspected endocrine disorder: Secondary | ICD-10-CM

## 2020-12-27 DIAGNOSIS — K7689 Other specified diseases of liver: Secondary | ICD-10-CM

## 2020-12-27 DIAGNOSIS — E538 Deficiency of other specified B group vitamins: Secondary | ICD-10-CM

## 2020-12-27 IMAGING — CR DG LUMBAR SPINE COMPLETE 4+V
1 series · 4 of 4 positions shown · non-contrast
Comparison: None.

CLINICAL DATA: Low back pain

EXAM:
LUMBAR SPINE - COMPLETE 4+ VIEW

[Series 1: dg lumbar spine complete 4 +v · 0.14mm/px · 4 of 4 slices shown]
[im 1/4]
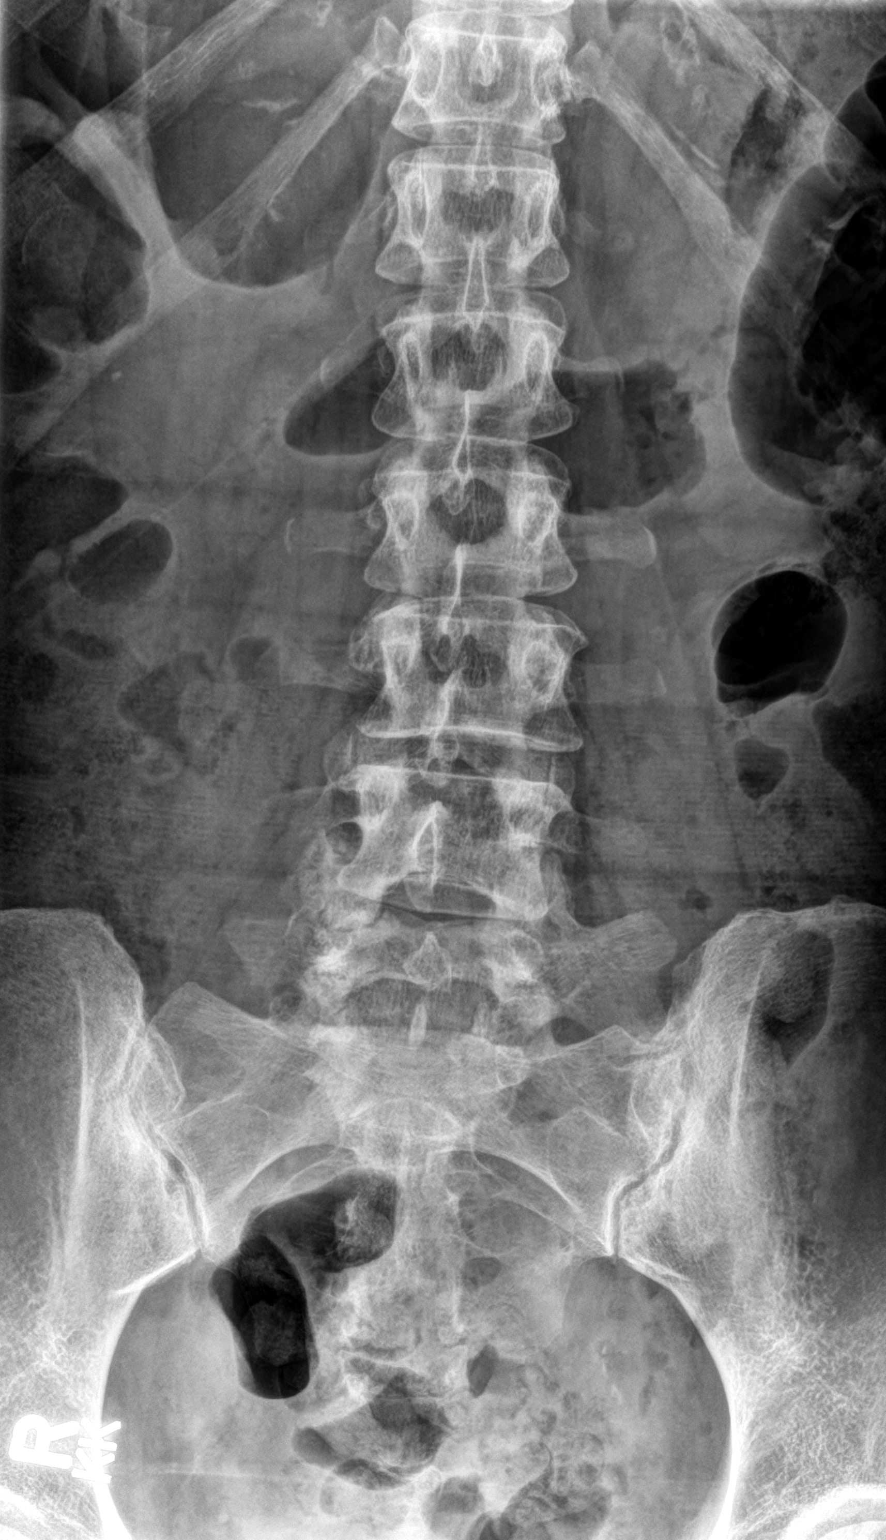
[im 2/4]
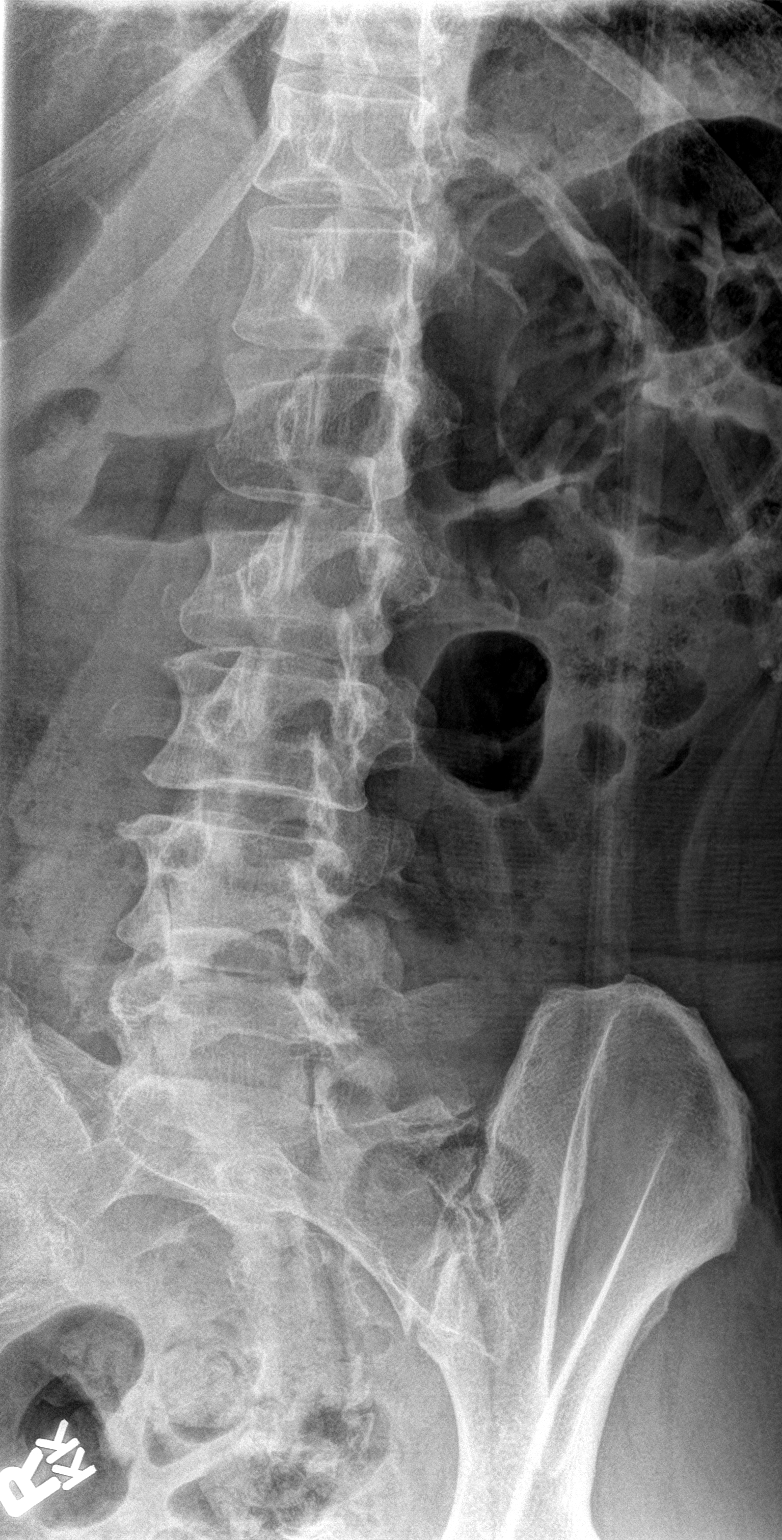
[im 3/4]
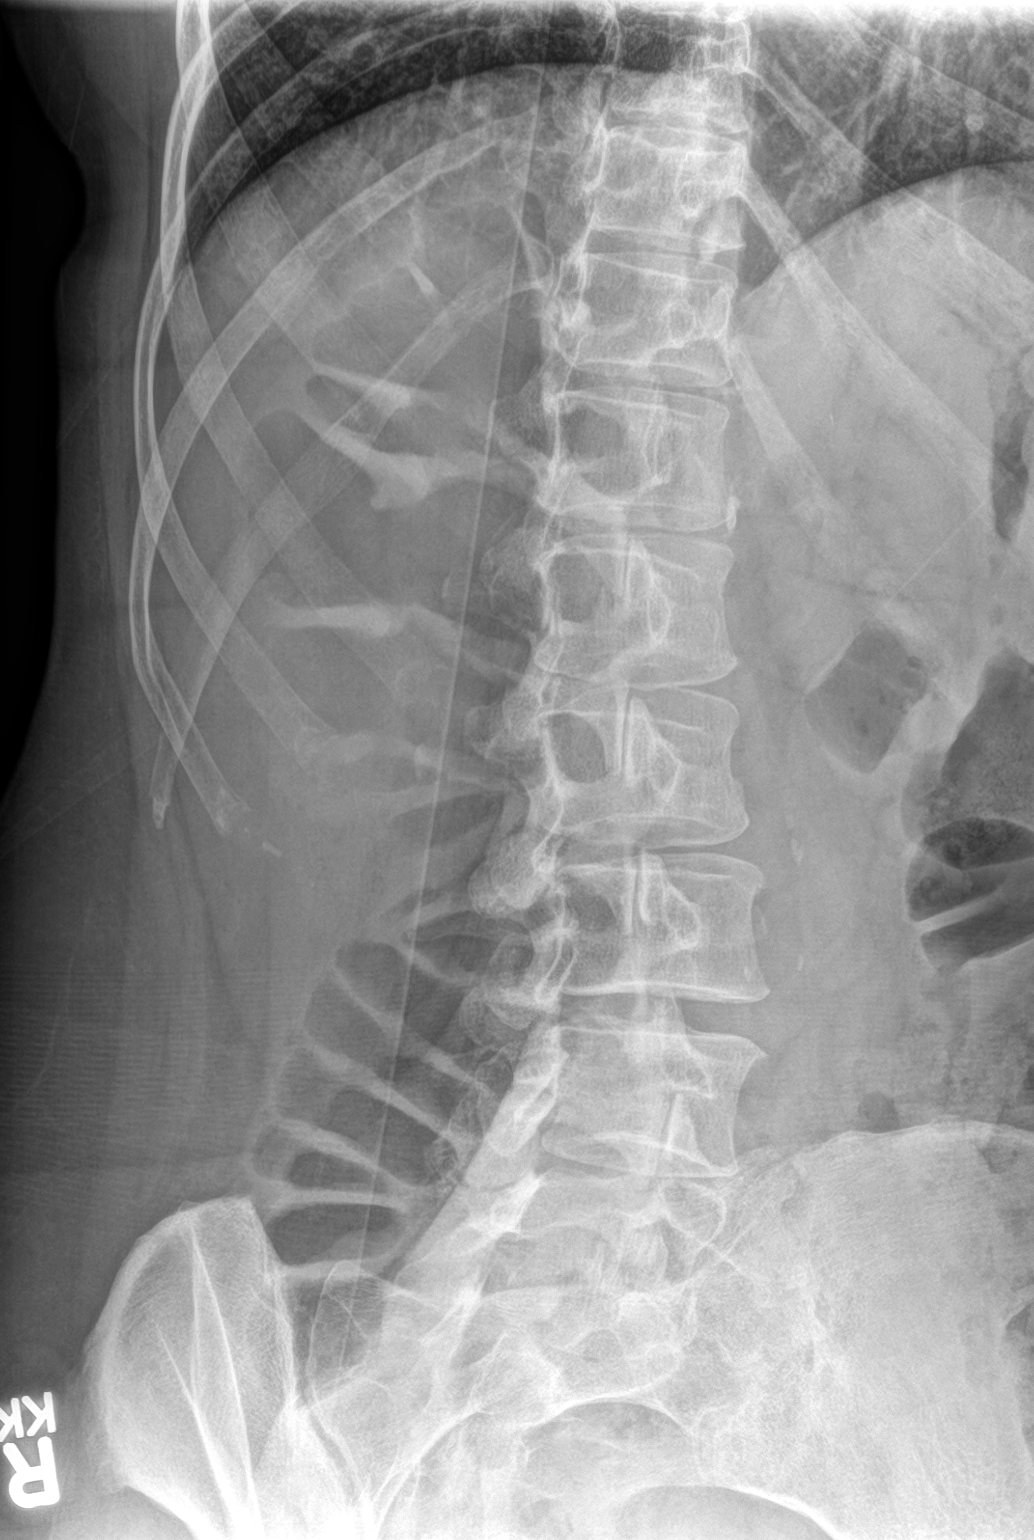
[im 4/4]
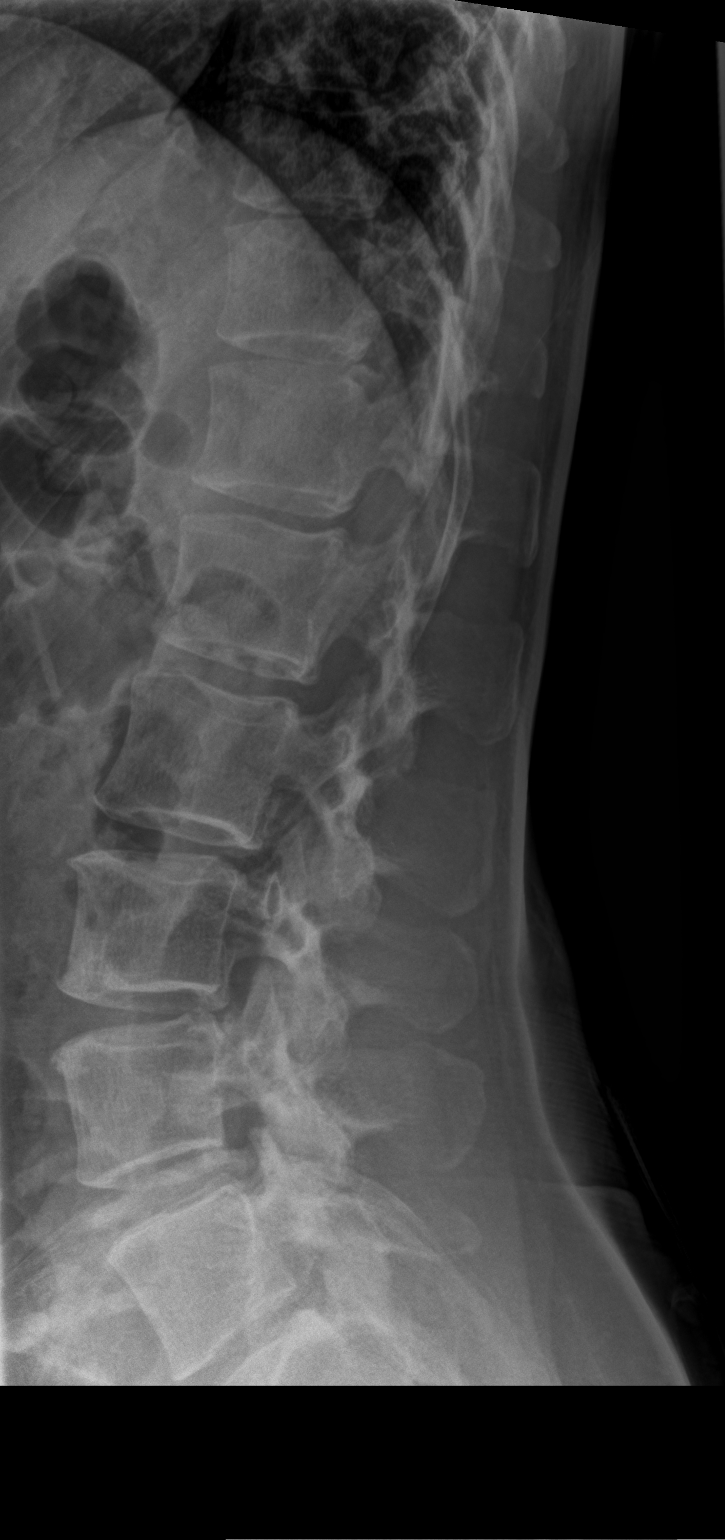

[4 of 4 positions shown; findings below may reference images not displayed]

FINDINGS: Transitional anatomy with 6 non rib-bearing lumbar type vertebra.
For the purposes of reporting, last well-formed vertebra will be
designated L5 and assumes hypoplastic or absent ribs at T12. Lumbar
alignment is normal. Mild disc space narrowing at L5-S1. Vertebral
body heights are maintained. Remaining disc spaces are patent. Mild
facet degenerative changes of the lower lumbar spine.
IMPRESSION: Transitional anatomy.  Mild degenerative changes at L5-S1.

## 2020-12-27 NOTE — Telephone Encounter (Signed)
Spoke with Dr. Paralee Cancel ortho [place] he rec. Himself for hip dysplasia or Dr. Mariana Arn

## 2020-12-27 NOTE — Telephone Encounter (Signed)
lft vm for pt to call ofc to sch MRI.thanks 

## 2020-12-27 NOTE — Progress Notes (Signed)
Chief Complaint  Patient presents with  . Follow-up   F/u  1. B/l hip dysplasia has seen Dr. Irish Elders in the past disc Dr. Trisha Mangle, Dr. Marygrace Drought she shes hip pain worse esp > 30 min walking and having low back pain  2. Fibrocystic breast tissue MRI 11/04/20 rec annual MRI and annual mammogram    Review of Systems  Constitutional: Negative for weight loss.  HENT: Negative for hearing loss.   Eyes: Negative for blurred vision.  Respiratory: Negative for shortness of breath.   Cardiovascular: Negative for chest pain.  Gastrointestinal: Negative for abdominal pain.  Genitourinary:       +hot flashes  +breast pain   Musculoskeletal: Negative for falls and joint pain.  Skin: Negative for rash.  Neurological: Positive for headaches.  Psychiatric/Behavioral: Negative for depression.   Past Medical History:  Diagnosis Date  . Arthritis   . COVID-19    14-Nov-2019  . Vitamin D deficiency    Past Surgical History:  Procedure Laterality Date  . 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 SHOULDER SURGERY     right capsular plication 9604    Family History  Problem Relation Age of Onset  . Hyperlipidemia Mother   . Hypertension Mother   . Breast cancer Mother   . Other Mother        covid 37 died due to this November 14, 2019  . Arthritis Father   . Cancer Father        neuroendocrine tumor small intestine to liver met  . COPD Father   . Hearing loss Father   . Depression Sister   . Diabetes Sister   . Seizures Sister   . Depression Brother   . Drug abuse Brother   . Stroke Maternal Grandmother   . Cancer Maternal Grandmother        testicular   . Early death Paternal Grandfather    Social History   Socioeconomic History  . Marital status: Married    Spouse name: Not on file  . Number of children: Not on file  . Years of education: Not on file  . Highest education level: Not on file   Occupational History  . Not on file  Tobacco Use  . Smoking status: Never Smoker  . Smokeless tobacco: Never Used  Substance and Sexual Activity  . Alcohol use: Yes  . Drug use: Not Currently  . Sexual activity: Yes  Other Topics Concern  . Not on file  Social History Narrative   3 kids   Married    Owns guns, wears seat belt, safe in relationship   [employer]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    Social Determinants of Health   Financial Resource Strain: Not on file  Food Insecurity: Not on file  Transportation Needs: Not on file  Physical Activity: Not on file  Stress: Not on file  Social Connections: Not on file  Intimate Partner Violence: Not on file   No outpatient medications have been marked as taking for the 12/27/20 encounter (Office Visit) with McLean-Scocuzza, Nino Glow, MD.   Current Facility-Administered Medications for the 12/27/20 encounter (Office Visit) with McLean-Scocuzza, Nino Glow, MD  Medication  . cyanocobalamin ((VITAMIN B-12)) injection 1,000 mcg   No Known Allergies No results found for this or any previous visit (from the past 11/13/58 hour(s)). Objective  Body mass index is 22.34 kg/m. Wt Readings from Last  3 Encounters:  12/27/20 148 lb (67.1 kg)  03/28/20 158 lb 12.8 oz (72 kg)  09/08/19 145 lb (65.8 kg)   Temp Readings from Last 3 Encounters:  12/27/20 97.7 F (36.5 C) (Oral)  03/28/20 98.1 F (36.7 C) (Oral)  06/01/18 98.2 F (36.8 C) (Oral)   BP Readings from Last 3 Encounters:  12/27/20 124/76  03/28/20 130/84  06/01/18 122/82   Pulse Readings from Last 3 Encounters:  12/27/20 76  03/28/20 67  09/04/19 64    Physical Exam Vitals and nursing note reviewed.  Constitutional:      Appearance: Normal appearance. She is well-developed and well-groomed.  HENT:     Head: Normocephalic and atraumatic.  Eyes:     Conjunctiva/sclera: Conjunctivae normal.     Pupils: Pupils are equal, round, and reactive to light.  Cardiovascular:     Rate and Rhythm:  Normal rate and regular rhythm.     Heart sounds: Normal heart sounds. No murmur heard.   Pulmonary:     Effort: Pulmonary effort is normal.     Breath sounds: Normal breath sounds.  Skin:    General: Skin is warm and dry.  Neurological:     General: No focal deficit present.     Mental Status: She is alert and oriented to person, place, and time. Mental status is at baseline.     Gait: Gait normal.  Psychiatric:        Attention and Perception: Attention and perception normal.        Mood and Affect: Mood and affect normal.        Speech: Speech normal.        Behavior: Behavior normal. Behavior is cooperative.        Thought Content: Thought content normal.        Cognition and Memory: Cognition and memory normal.        Judgment: Judgment normal.     Assessment  Plan  Low back pain, unspecified back pain laterality, unspecified chronicity, unspecified whether sciatica present - Plan: DG Lumbar Spine Complete B/l hip dysplasia  Disc Dr. Debara Pickett, Poggi/Hooten/Scott Culler Wilson   Vitamin D deficiency - Plan: Vitamin D (25 hydroxy) B12 deficiency - Plan: Vitamin B12  HM-CPE at f/u Flu 06/10/2019  MMR immune  Tdap utd  covid 3/3  Disc hep B vaccine future  Fasting labs  rec healthy diet and exercise   Reviewed labs declines STD check  Reviewed mammogram 04/04/20 neg referred today [place] rec annual MRI breast as well last 11/04/20  Screening mammogram, encounter for - Plan: MM 3D SCREEN BREAST BILATERAL, MR BREAST BILATERAL WO CONTRAST Fibrocystic breast changes, unspecified laterality - Plan: MR BREAST BILATERAL WO CONTRAST  Colonoscopy referred today Dr. Alice Reichert need ROI  Pap today surgically absent uterus and cervix mild discharge s/p hysterectomy cin 2 07/2007 with ovaries and FT intact   Dermatology FIL Dr. Koleen Nimrod (retired dermatologist) he checks her skin  As of 12/27/20 no issues   Breast pain - Plan: MM DIAG BREAST TOMO BILATERAL, US BREAST LTD UNI  LEFT INC AXILLA, US BREAST LTD UNI RIGHT INC AXILLA  FH: breast cancer in first degree relative - Plan: MM DIAG BREAST TOMO BILATERAL, US BREAST LTD UNI LEFT INC AXILLA, US BREAST LTD UNI RIGHT INC AXILLA  Shortness of breath - Plan: albuterol (VENTOLIN HFA) 108 (90 Base) MCG/ACT inhaler Bronchospasm - Plan: albuterol (VENTOLIN HFA) 108 (90 Base) MCG/ACT inhaler Consider pulm referral in the future  Vitamin D deficiency - Plan: Vitamin D (25 hydroxy)  Hyperlipidemia, unspecified hyperlipidemia type - Plan: Lipid panel  Fatigue, unspecified type - Plan: Comprehensive metabolic panel, CBC with Differential/Platelet, TSH, Urinalysis, Routine w reflex microscopic, Iron, TIBC and Ferritin Panel HM w/u as well  Provider: Dr. Olivia Mackie McLean-Scocuzza-Internal Medicine

## 2020-12-27 NOTE — Patient Instructions (Addendum)
OB/[facility]N [facility] ob/gyn Dr. Kari Baars [place] [facility] [place] or [facility] flashes estrovan or amberen avoid black cohosh ! Consider yearly breast MRI  Screening mammogram 04/04/21  Call elon 03/29/21   panoxyl wash in shower caution it will bleach   Menopause Menopause is the normal time of a woman's life when menstrual periods stop completely. It marks the natural end to a woman's ability to become pregnant. It can be defined as the absence of a menstrual period for 12 months without another medical cause. The transition to menopause (perimenopause) most often happens between the ages of 82 and 59, and can last for many years. During perimenopause, hormone levels change in your body, which can cause symptoms and affect your health. Menopause may increase your risk for:  Weakened bones (osteoporosis), which causes fractures.  Depression.  Hardening and narrowing of the arteries (atherosclerosis), which can cause heart attacks and strokes. What are the causes? This condition is usually caused by a natural change in hormone levels that happens as you get older. The condition may also be caused by changes that are not natural, including:  Surgery to remove both ovaries (surgical menopause).  Side effects from some medicines, such as chemotherapy used to treat cancer (chemical menopause). What increases the risk? This condition is more likely to start at an earlier age if you have certain medical conditions or have undergone treatments, including:  A tumor of the pituitary gland in the brain.  A disease that affects the ovaries and hormones.  Certain cancer treatments, such as chemotherapy or hormone therapy, or radiation therapy on the pelvis.  Heavy smoking and excessive alcohol use.  Family history of early menopause. This condition is also more likely to develop earlier in women who are very thin. What are the signs or  symptoms? Symptoms of this condition include:  Hot flashes.  Irregular menstrual periods.  Night sweats.  Changes in feelings about sex. This could be a decrease in sex drive or an increased discomfort around your sexuality.  Vaginal dryness and thinning of the vaginal walls. This may cause painful sex.  Dryness of the skin and development of wrinkles.  Headaches.  Problems sleeping (insomnia).  Mood swings or irritability.  Memory problems.  Weight gain.  Hair growth on the face and chest.  Bladder infections or problems with urinating. How is this diagnosed? This condition is diagnosed based on your medical history, a physical exam, your age, your menstrual history, and your symptoms. Hormone tests may also be done. How is this treated? In some cases, no treatment is needed. You and your health care provider should make a decision together about whether treatment is necessary. Treatment will be based on your individual condition and preferences. Treatment for this condition focuses on managing symptoms. Treatment may include:  Menopausal hormone therapy (MHT).  Medicines to treat specific symptoms or complications.  Acupuncture.  Vitamin or herbal supplements. Before starting treatment, make sure to let your health care provider know if you have a personal or family history of these conditions:  Heart disease.  Breast cancer.  Blood clots.  Diabetes.  Osteoporosis. Follow these instructions at home: Lifestyle  Do not use any products that contain nicotine or tobacco, such as cigarettes, e-cigarettes, and chewing tobacco. If you need help quitting, ask your health care provider.  Get at least 30 minutes of physical activity on 5 or more days each week.  Avoid alcoholic and caffeinated beverages, as well as spicy foods. This may help prevent hot flashes.  Get 7-8 hours of sleep each night.  If you have hot flashes, try: ? Dressing in layers. ? Avoiding  things that may trigger hot flashes, such as spicy food, warm places, or stress. ? Taking slow, deep breaths when a hot flash starts. ? Keeping a fan in your home and office.  Find ways to manage stress, such as deep breathing, meditation, or journaling.  Consider going to group therapy with other women who are having menopause symptoms. Ask your health care provider about recommended group therapy meetings. Eating and drinking  Eat a healthy, balanced diet that contains whole grains, lean protein, low-fat dairy, and plenty of fruits and vegetables.  Your health care provider may recommend adding more soy to your diet. Foods that contain soy include tofu, tempeh, and soy milk.  Eat plenty of foods that contain calcium and vitamin D for bone health. Items that are rich in calcium include low-fat milk, yogurt, beans, almonds, sardines, broccoli, and kale.   Medicines  Take over-the-counter and prescription medicines only as told by your health care provider.  Talk with your health care provider before starting any herbal supplements. If prescribed, take vitamins and supplements as told by your health care provider. General instructions  Keep track of your menstrual periods, including: ? When they occur. ? How heavy they are and how long they last. ? How much time passes between periods.  Keep track of your symptoms, noting when they start, how often you have them, and how long they last.  Use vaginal lubricants or moisturizers to help with vaginal dryness and improve comfort during sex.  Keep all follow-up visits. This is important. This includes any group therapy or counseling.   Contact a health care provider if:  You are still having menstrual periods after age 52.  You have pain during sex.  You have not had a period for 12 months and you develop vaginal bleeding. Get help right away if you have:  Severe depression.  Excessive vaginal bleeding.  Pain when you  urinate.  A fast or irregular heartbeat (palpitations).  Severe headaches.  Abdominal pain or severe indigestion. Summary  Menopause is a normal time of life when menstrual periods stop completely. It is usually defined as the absence of a menstrual period for 12 months without another medical cause.  The transition to menopause (perimenopause) most often happens between the ages of 5145 and 8755 and can last for several years.  Symptoms can be managed through medicines, lifestyle changes, and complementary therapies such as acupuncture.  Eat a balanced diet that is rich in nutrients to promote bone health and heart health and to manage symptoms during menopause. This information is not intended to replace advice given to you by your health care provider. Make sure you discuss any questions you have with your health care provider. Document Revised: 06/07/2020 Document Reviewed: 02/22/2020 Elsevier Patient Education  2021 [employer] Inc.   Fibrocystic Breast Changes  Fibrocystic breast changes are changes in breast tissue that can cause breasts to become swollen, lumpy, or painful. This can happen due to buildup of scar-like tissue (fibrous tissue) or the forming of fluid-filled lumps (cysts) in the breast. Fibrocystic breast changes can affect one or both breasts. The condition is common, and it is not cancer. What are the causes? The exact cause of fibrocystic breast changes is not known. However, this condition may be:  Related to the female hormones estrogen and progesterone.  Influenced by family traits that get passed from parent to child (inherited). What are the signs or symptoms? Symptoms of this condition include:  Tenderness, swelling, mild discomfort, or pain.  Rope-like tissue that can be felt when touching the breast.  Lumps in one or both breasts.  Changes in breast size. Breasts may get larger before a menstrual period and smaller after a menstrual  period.  Discharge from the nipple. Symptoms of this condition may affect one or both breasts and are usually worse before menstrual periods start. Symptoms usually get better toward the end of menstrual periods. How is this diagnosed? This condition is diagnosed based on your medical history and a physical exam of your breasts. You may also have tests, such as:  A breast X-ray (mammogram).  Ultrasound.  MRI.  Removing a small sample of tissue from the breast for tests (breast biopsy). This may be done if your health care provider thinks that something else may be causing changes in your breasts. How is this treated? Often, treatment is not needed for this condition. In some cases, however, treatment may be needed, including:  Taking over-the-counter pain medicines to help relieve pain or discomfort.  Limiting or avoiding caffeine. Foods and beverages that contain caffeine include chocolate, soda, coffee, and tea.  Reducing sugar and fat in your diet. Treatment may also include:  A procedure to remove fluid from a cyst that is causing pain (fine needle aspiration).  Surgery to remove a cyst that is large or tender or does not go away.  Medicines that may lower the amount of female hormones. Follow these instructions at home: Self care  Check your breasts after every menstrual period. If you do not have menstrual periods, check your breasts on the first day of every month. Feel for changes in your breasts, such as: ? More tenderness. ? A new growth. ? A change in size. ? A change in an existing lump. General instructions  Take over-the-counter and prescription medicines only as told by your health care provider.  Wear a well-fitting support or sports bra, especially when exercising.  If told by your health care provider, decrease or avoid caffeine, fat, and sugar in your diet.  Keep all follow-up visits as told by your health care provider. This is important. Contact a  health care provider if:  You have fluid leaking from your nipple, especially if it is bloody.  You have new lumps or bumps in your breast.  Your breast becomes enlarged, red, and painful.  You have areas of your breast that pucker inward.  Your nipple appears flat or indented. Get help right away if:  You have redness of your breast and the redness is spreading. Summary  Fibrocystic breast changes are changes in breast tissue that can cause breasts to become swollen, lumpy, or painful.  This condition may be related to the female hormones estrogen and progesterone.  With this condition, it is important to examine your breasts after every menstrual period. If you do not have menstrual periods, check your breasts on the first day of every month. This information is not intended to replace advice given to you by your health care provider. Make sure you discuss any questions you have with your health care provider. Document Revised: 08/21/2019 Document Reviewed: 08/21/2019 Elsevier Patient Education  2021 Else[employer] Inc.  Intrauterine Device Information An intrauterine device (IUD) is a medical device that is inserted into the uterus to  prevent pregnancy. It is a small, T-shaped device that has one or two nylon strings hanging down from it. The strings hang out of the lower part of the uterus (cervix) to allow for future IUD removal. There are two types of IUDs:  Hormone IUD. This type of IUD is made of plastic and contains the hormone progestin (synthetic progesterone). A hormone IUD may last 3-5 years.  Copper IUD. This type of IUD has copper wire wrapped around it. A copper IUD may last up to 10 years. How is an IUD inserted? An IUD is inserted through the vagina, through the cervix, and into the uterus with a minor medical procedure. The procedure for IUD insertion may vary among health care providers and hospitals. How does an IUD work? Synthetic progesterone in a hormonal IUD  prevents pregnancy by:  Thickening cervical mucus to prevent sperm from entering the uterus.  Thinning the uterine lining to prevent a fertilized egg from being implanted there. Copper in a copper IUD prevents pregnancy by making the uterus and fallopian tubes produce a fluid that kills sperm. What are the advantages of an IUD? Advantages of either type of IUD An IUD:  Is highly effective in preventing pregnancy.  Is reversible. You can become pregnant shortly after the IUD is removed.  Is low-maintenance and can stay in place for a long time.  Has no estrogen-related side effects.  Can be used when breastfeeding.  Is not associated with weight gain.  Can be inserted right after childbirth, an abortion, or a miscarriage. Advantages of a hormone IUD  If it is inserted within 7 days of your period starting, it works right after it has been inserted. If the hormone IUD is inserted at any other time in your cycle, you will need to use a backup method of birth control for 7 days after insertion.  It can make menstrual periods lighter or stop completely.  It can reduce menstrual cramping and other discomforts from menstrual periods.  It can be used for 3-5 years, depending on which IUD you have. Advantages of a copper IUD  It works right after it is inserted.  It can be used as a form of emergency birth control if it is inserted within 5 days after having unprotected sex.  It does not interfere with your body's natural hormones.  It can be used for up to 10 years. What are the disadvantages of an IUD?  An IUD may cause irregular menstrual bleeding for a period of time after insertion.  It is common to have pain during insertion and have cramping and vaginal bleeding after insertion.  An IUD may cut the uterus (uterine perforation) when it is inserted. This is rare.  Pelvic inflammatory disease (PID) may happen after insertion of an IUD. PID is an infection in the uterus and  fallopian tubes. The IUD does not cause the infection. The infection is usually from an unknown sexually transmitted infection (STI). This is rare, and it usually happens during the first 20 days after the IUD is inserted.  A copper IUD can make your menstrual flow heavier and more painful.  IUDs cannot prevent sexually transmitted infections (STIs). How is an IUD removed?   You will lie on your back with your knees bent and your feet in footrests (stirrups).  A device will be inserted into your vagina to spread apart the vaginal walls (speculum). This will allow your health care provider to see the strings attached to the IUD.  Your health care provider will use a small instrument (forceps) to grasp the IUD strings and will pull firmly until the IUD is removed. You may have some discomfort when the IUD is removed. Your health care provider may recommend taking over-the-counter pain relievers, such as ibuprofen, before the procedure. You may also have minor spotting for a few days after the procedure. The procedure for IUD removal may vary among health care providers and hospitals. Is an IUD right for me? If you are interested in an IUD, discuss it with your health care provider. He or she will make sure you are a good candidate for an IUD and will let you know more about the advantages, disadvantage, and possible side effects. This will allow you to make a decision about the device. Summary  An intrauterine device (IUD) is a medical device that is inserted in the uterus to prevent pregnancy. It is a small, T-shaped device that has one or two nylon strings hanging down from it.  A hormone IUD contains the hormone progestin (synthetic progesterone). A copper IUD has copper wire wrapped around it.  Synthetic progesterone in a hormone IUD prevents pregnancy by thickening cervical mucus and thinning the walls of the uterus. Copper in a copper IUD prevents pregnancy by making the uterus and  fallopian tubes produce a fluid that kills sperm.  A hormone IUD can be left in place for 3-5 years. A copper IUD can be left in place for up to 10 years.  An IUD is inserted and removed by a health care provider. You may feel some pain during insertion and removal. Your health care provider may recommend taking over-the-counter pain medicine, such as ibuprofen, before an IUD procedure. This information is not intended to replace advice given to you by your health care provider. Make sure you discuss any questions you have with your health care provider. Document Revised: 03/20/2020 Document Reviewed: 03/20/2020 Elsevier Patient Education  2021 [employer].

## 2020-12-30 NOTE — Telephone Encounter (Signed)
Patient informed and verbalized understanding

## 2021-03-14 ENCOUNTER — Encounter: Payer: Self-pay | Admitting: Internal Medicine

## 2021-06-09 ENCOUNTER — Other Ambulatory Visit (HOSPITAL_COMMUNITY)
Admission: RE | Admit: 2021-06-09 | Discharge: 2021-06-09 | Disposition: A | Discharge status: Home / Self Care | Payer: BC Managed Care – PPO | Source: Ambulatory Visit | Attending: Family | Admitting: Family

## 2021-06-09 ENCOUNTER — Ambulatory Visit: Payer: BC Managed Care – PPO | Admitting: Family

## 2021-06-09 ENCOUNTER — Encounter: Payer: Self-pay | Admitting: Family

## 2021-06-09 ENCOUNTER — Other Ambulatory Visit: Payer: Self-pay

## 2021-06-09 VITALS — BP 136/70 | HR 75 | Temp 98.4°F | Ht 68.25 in | Wt 152.0 lb

## 2021-06-09 DIAGNOSIS — M25552 Pain in left hip: Secondary | ICD-10-CM

## 2021-06-09 DIAGNOSIS — R140 Abdominal distension (gaseous): Secondary | ICD-10-CM | POA: Diagnosis not present

## 2021-06-09 DIAGNOSIS — M25551 Pain in right hip: Secondary | ICD-10-CM

## 2021-06-09 DIAGNOSIS — N6019 Diffuse cystic mastopathy of unspecified breast: Secondary | ICD-10-CM

## 2021-06-09 DIAGNOSIS — Z9189 Other specified personal risk factors, not elsewhere classified: Secondary | ICD-10-CM | POA: Diagnosis not present

## 2021-06-09 DIAGNOSIS — N898 Other specified noninflammatory disorders of vagina: Secondary | ICD-10-CM | POA: Insufficient documentation

## 2021-06-09 DIAGNOSIS — Z23 Encounter for immunization: Secondary | ICD-10-CM | POA: Diagnosis not present

## 2021-06-09 NOTE — Assessment & Plan Note (Addendum)
Presentation not consistent with candidiasis.  Differential includes vaginal atrophy. Pending vaginal swab, pelvic US to exclude ovarian pathology.

## 2021-06-09 NOTE — Assessment & Plan Note (Signed)
Her mother has a family history of breast cancer.  I ordered a diagnostic mammogram and counseled patient on the importance of identifying if her lifetime risk of breast cancer is greater than 20% so that an MR bilateral breast annually is covered by insurance as well as offers her another screening within a calendar year.  Patient is very agreeable to this.  For now we have opted to pursue genetics consult with the plan to have MRI breast and screening mammogram separated by 6 months annually.

## 2021-06-09 NOTE — Progress Notes (Signed)
Subjective:    Patient ID: Kaitlyn Kane, female    DOB: 05-31-1971, 50 y.o.   MRN: [id number]  CC: Kaitlyn Kane is a 50 y.o. female who presents today for an acute visit.    HPI: Acute visit for burning in the vaginal area.  She had concerns for yeast infection.  She is concerned that she may have a bulge coming out of the vagina for bladder prolapse.  History of hysterectomy 11-15-2006 with h/o HPV HSIL/CIN-2.  She still has fallopian tubes and ovaries.  No pelvic pain, dyspareunia  Complains abdominal bloating, bilateral posterior hip pain for a couple of months.  XR lumbar 12/2020 showing Lumbar alignment is normal. Mild disc space narrowing at L5-S1.  No injury, neuropathy,constipation, dysuria, numbness, saddle anesthesia. She feels she emptying her bladder completely.     She has 3 kids.     She also complains of bilateral breast pain, right lateral breast pain tenderness more bothersome, x 2 months.    Sore to the touch of right breast. Pain with lifting arm such as using steering wheel.   Left supra-nipple breast pain  x 2 months.   No nipple discharge, palpable mass.    No MRI evidence of malignancy February 2022.  Due for an annual screening mammogram in July 2022. Right and left breast biopsy August 2021 Mother had a history of breast cancer  HISTORY:  Past Medical History:  Diagnosis Date   Arthritis    COVID-19    2021   Vitamin D deficiency    Past Surgical History:  Procedure Laterality Date   ABDOMINAL HYSTERECTOMY     11/15/06 HPV HSIL/CIN 2 adenomyosis cervix and uterus out still has fallopian tubes and ovaries    SHOULDER SURGERY     right capsular plication 9379    Family History  Problem Relation Age of Onset   Hyperlipidemia Mother    Hypertension Mother    Breast cancer Mother    Other Mother        covid 32 died due to this 2019/11/16   Arthritis Father    Cancer Father        neuroendocrine tumor small intestine to liver met   COPD  Father    Hearing loss Father    Depression Sister    Diabetes Sister    Seizures Sister    Depression Brother    Drug abuse Brother    Stroke Maternal Grandmother    Cancer Maternal Grandmother        testicular    Early death Paternal Grandfather     Allergies: Patient has no known allergies. No current outpatient medications on file prior to visit.   Current Facility-Administered Medications on File Prior to Visit  Medication Dose Route Frequency Provider Last Rate Last Admin   cyanocobalamin ((VITAMIN B-12)) injection 1,000 mcg  1,000 mcg Intramuscular Q30 days McLean-Scocuzza, Nino Glow, MD   1,000 mcg at 06/18/20 1457    Social History   Tobacco Use   Smoking status: Never   Smokeless tobacco: Never  Substance Use Topics   Alcohol use: Yes   Drug use: Not Currently    Review of Systems  Constitutional:  Negative for chills and fever.  Respiratory:  Negative for cough.   Cardiovascular:  Negative for chest pain and palpitations.  Gastrointestinal:  Positive for abdominal distention. Negative for abdominal pain, nausea and vomiting.  Genitourinary:  Positive for vaginal discharge. Negative for difficulty urinating, dyspareunia and pelvic pain.  Musculoskeletal:  Positive for back pain.  Neurological:  Negative for numbness.     Objective:    BP 136/70 (BP Location: Left Arm, Patient Position: Sitting, Cuff Size: Normal)   Pulse 75   Temp 98.4 F (36.9 C) (Oral)   Ht 5' 8.25" (1.734 m)   Wt 152 lb (68.9 kg)   SpO2 99%   BMI 22.94 kg/m    Physical Exam Vitals reviewed.  Constitutional:      Appearance: Normal appearance. She is well-developed.  Eyes:     Conjunctiva/sclera: Conjunctivae normal.  Cardiovascular:     Rate and Rhythm: Normal rate and regular rhythm.     Pulses: Normal pulses.     Heart sounds: Normal heart sounds.  Pulmonary:     Effort: Pulmonary effort is normal.     Breath sounds: Normal breath sounds. No wheezing, rhonchi or rales.   Abdominal:     General: Bowel sounds are normal. There is no distension.     Palpations: Abdomen is soft. Abdomen is not rigid. There is no fluid wave or mass.     Tenderness: There is no abdominal tenderness. There is no guarding or rebound.  Genitourinary:    Labia:        Right: No rash, tenderness or lesion.        Left: No rash, tenderness or lesion.      Vagina: No foreign body. No vaginal discharge, erythema, tenderness or bleeding.     Cervix: No cervical motion tenderness or discharge.     Adnexa:        Right: No mass, tenderness or fullness.         Left: No mass, tenderness or fullness.       Comments: No vulvovaginal erythema. No lesions. Discharge is very scant. Vagina has scant vaginal secretions. No bladder prolapse identified. Musculoskeletal:     Lumbar back: No swelling, edema, spasms, tenderness or bony tenderness. Normal range of motion.     Comments: Full range of motion with flexion, tension, lateral side bends. No bony tenderness. No pain, numbness, tingling elicited with single leg raise bilaterally.  Pain with palpation of SI joint bilaterally.   Skin:    General: Skin is warm and dry.  Neurological:     Mental Status: She is alert.     Sensory: No sensory deficit.     Deep Tendon Reflexes:     Reflex Scores:      Patellar reflexes are 2+ on the right side and 2+ on the left side.    Comments: Sensation and strength intact bilateral lower extremities.  Psychiatric:        Speech: Speech normal.        Behavior: Behavior normal.        Thought Content: Thought content normal.       Assessment & Plan:   Problem List Items Addressed This Visit       Musculoskeletal and Integument   Vaginal itching - Primary    Presentation not consistent with candidiasis.  Differential includes vaginal atrophy. Pending vaginal swab, pelvic US to exclude ovarian pathology.      Relevant Orders   Cervicovaginal ancillary only( [facility])     Other   Abdominal  bloating   Relevant Orders   US Pelvic Complete With Transvaginal   CA 125   Celiac Disease Ab Screen w/Rfx   At increased risk of breast cancer   Relevant Orders   Ambulatory referral to [facility]  MM DIAG BREAST TOMO BILATERAL   Fibrocystic breast changes    Her mother has a family history of breast cancer.  I ordered a diagnostic mammogram and counseled patient on the importance of identifying if her lifetime risk of breast cancer is greater than 20% so that an MR bilateral breast annually is covered by insurance as well as offers her another screening within a calendar year.  Patient is very agreeable to this.  For now we have opted to pursue genetics consult with the plan to have MRI breast and screening mammogram separated by 6 months annually.      Pain of both hip joints    Presentation consistent with SI joint dysfunction.  I advised that we could pursue 6 weeks of physical therapy to see if pain would improve.  She politely declines that she would like to focus on GU symptoms at this time      Other Visit Diagnoses     Need for immunization against influenza       Relevant Orders   Flu Vaccine QUAD 65moIM (Fluarix, Fluzone & Alfiuria Quad PF) (Completed)         We will continue to administer cyanocobalamin.   No orders of the defined types were placed in this encounter.   Return precautions given.   Risks, benefits, and alternatives of the medications and treatment plan prescribed today were discussed, and patient expressed understanding.   Education regarding symptom management and diagnosis given to patient on AVS.  Continue to follow with McLean-Scocuzza, TNino Glow MD for routine health maintenance.   SForestine Naand I agreed with plan.   MMable Paris FNP

## 2021-06-09 NOTE — Assessment & Plan Note (Addendum)
Presentation consistent with SI joint dysfunction.  I advised that we could pursue 6 weeks of physical therapy to see if pain would improve.  She politely declines that she would like to focus on GU symptoms at this time

## 2021-06-09 NOTE — Patient Instructions (Addendum)
Referral to genetics.  Ultrasound of pelvis ordered.  Diagnostic bilateral mammogram ordered.  Let us know if you dont hear back within a week in regards to an appointment being scheduled.     Nice to meet you!

## 2021-06-10 ENCOUNTER — Other Ambulatory Visit: Payer: Self-pay | Admitting: Family

## 2021-06-10 DIAGNOSIS — Z9189 Other specified personal risk factors, not elsewhere classified: Secondary | ICD-10-CM

## 2021-06-10 LAB — CA 125: CA 125: 8 U/mL (ref <35)

## 2021-06-11 LAB — CELIAC DISEASE AB SCREEN W/RFX
Antigliadin Abs, IgA: 3 units (ref 0–19)
IgA/Immunoglobulin A, Serum: 200 mg/dL (ref 87–352)
Transglutaminase IgA: <2 U/mL (ref 0–3)

## 2021-06-11 LAB — CERVICOVAGINAL ANCILLARY ONLY
Bacterial Vaginitis (gardnerella): NEGATIVE
Candida Glabrata: NEGATIVE
Candida Vaginitis: NEGATIVE
Comment: NEGATIVE
Comment: NEGATIVE
Comment: NEGATIVE

## 2021-06-18 ENCOUNTER — Other Ambulatory Visit: Payer: Self-pay

## 2021-06-18 ENCOUNTER — Ambulatory Visit
Admission: RE | Admit: 2021-06-18 | Discharge: 2021-06-18 | Disposition: A | Discharge status: Home / Self Care | Payer: BC Managed Care – PPO | Source: Ambulatory Visit | Attending: Family | Admitting: Family

## 2021-06-18 DIAGNOSIS — R140 Abdominal distension (gaseous): Secondary | ICD-10-CM | POA: Insufficient documentation

## 2021-06-18 IMAGING — US US PELVIS COMPLETE WITH TRANSVAGINAL
1 series · 14 of 25 positions shown · non-contrast
Comparison: [DATE]

CLINICAL DATA: Abdomen bloating and distention

EXAM:
TRANSABDOMINAL AND TRANSVAGINAL ULTRASOUND OF PELVIS
TECHNIQUE: Both transabdominal and transvaginal ultrasound examinations of the
pelvis were performed. Transabdominal technique was performed for
global imaging of the pelvis including uterus, ovaries, adnexal
regions, and pelvic cul-de-sac. It was necessary to proceed with
endovaginal exam following the transabdominal exam to visualize the
adnexa and ovaries

[Series 1: us pelvic complete with transvaginal · 14 of 81 slices shown]
[im 1/81]
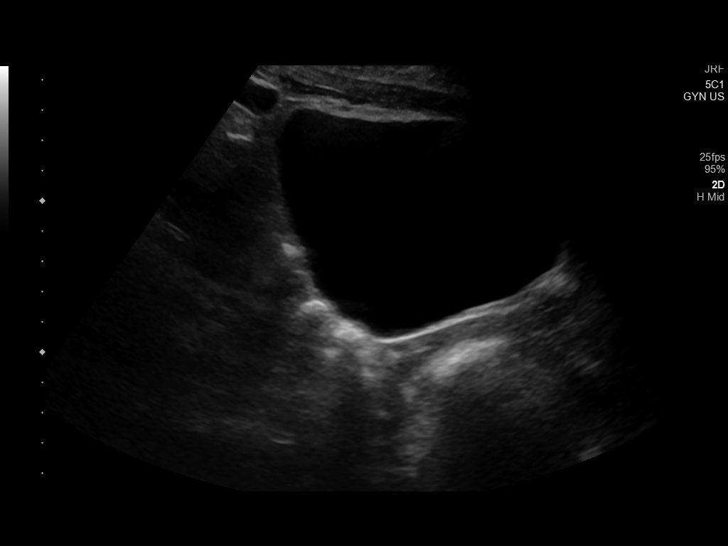
[im 7/81]
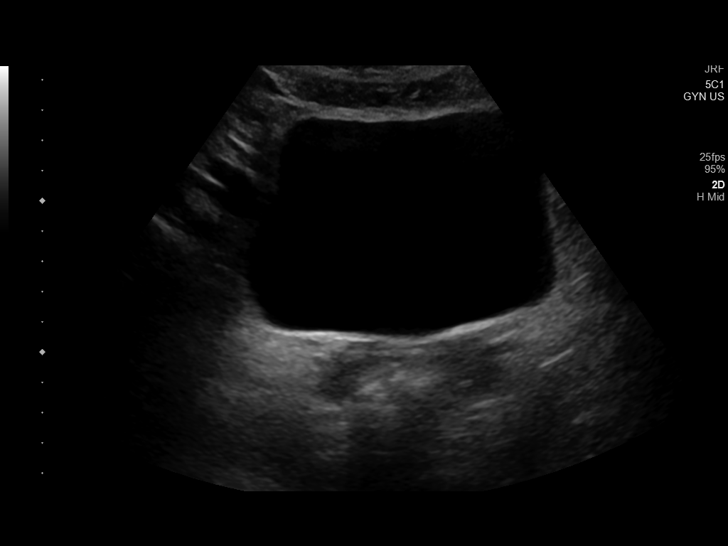
[im 14/81]
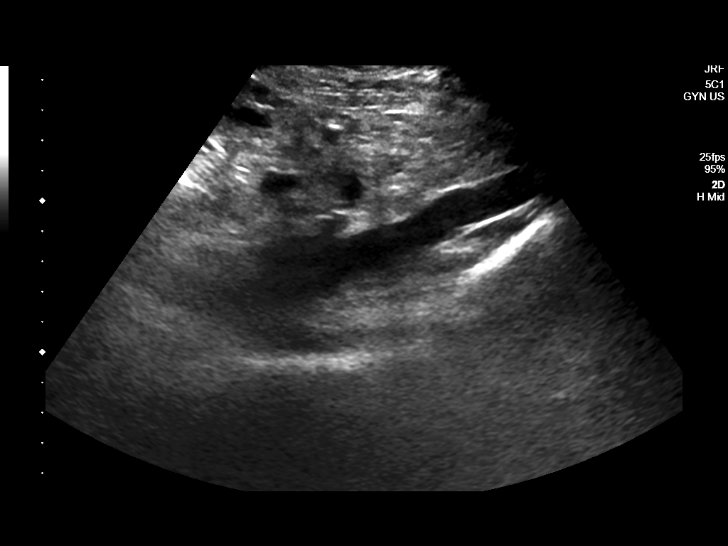
[im 21/81]
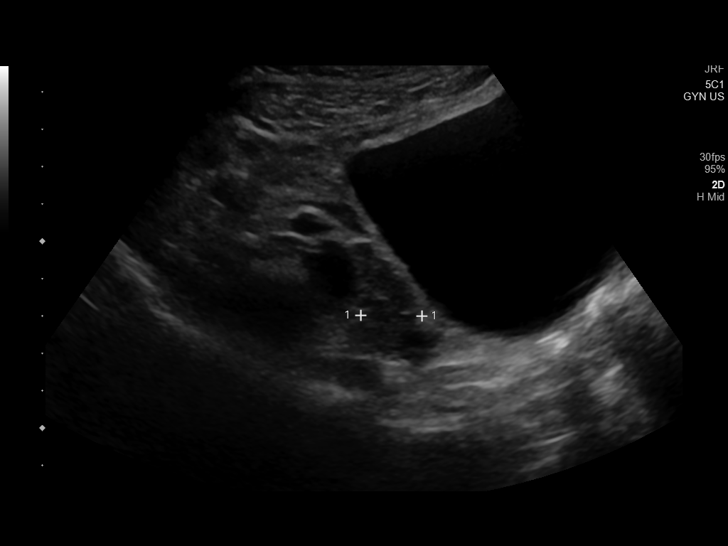
[im 27/81]
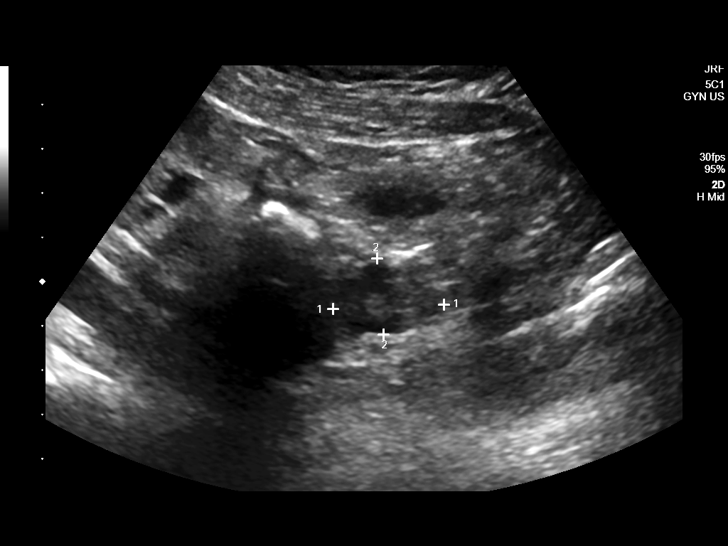
[im 31/81]
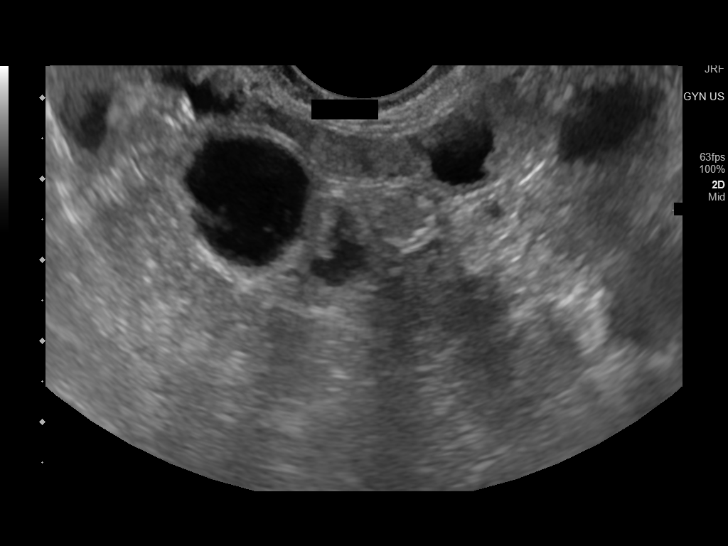
[im 37/81]
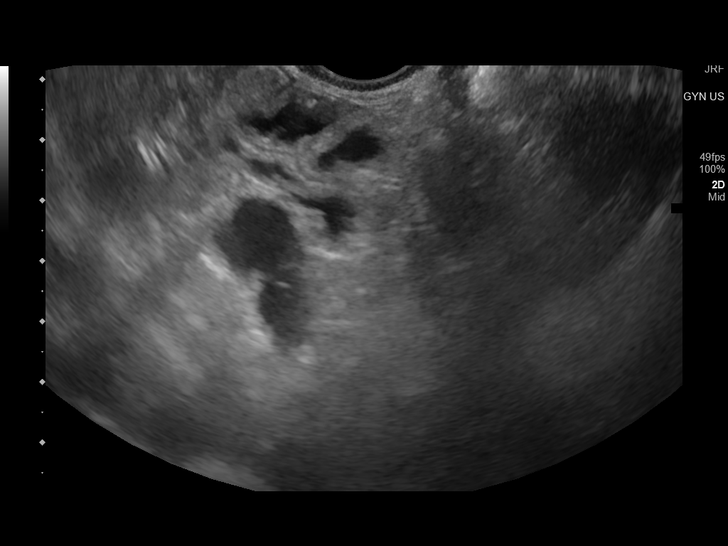
[im 44/81]
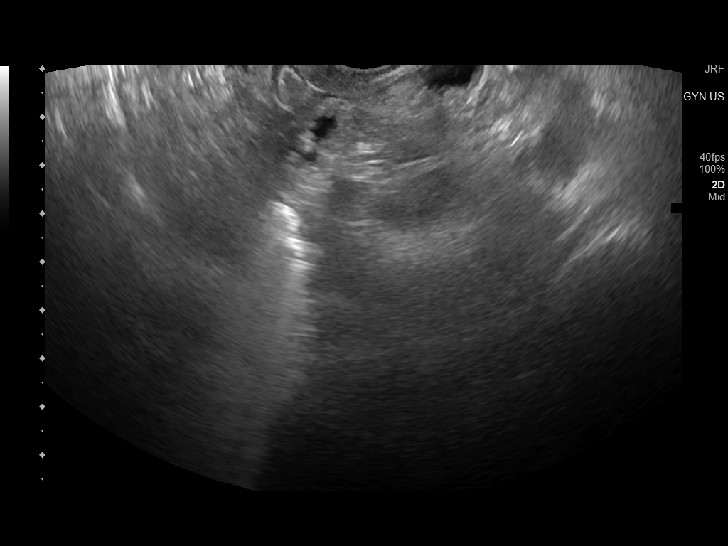
[im 51/81]
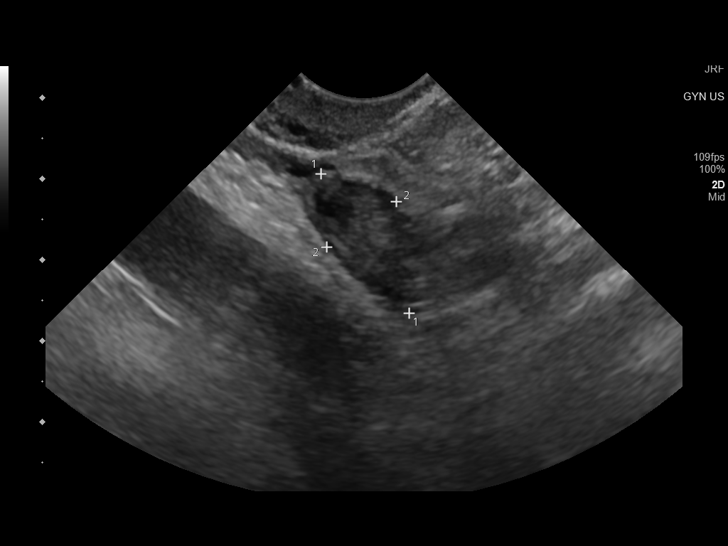
[im 54/81]
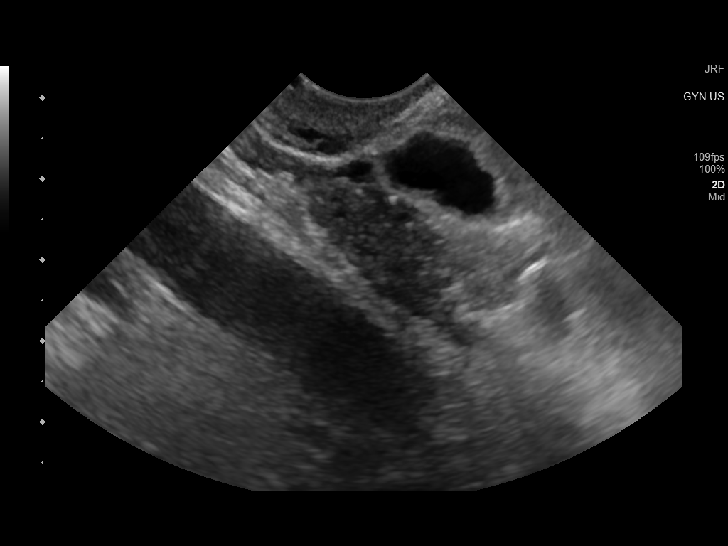
[im 61/81]
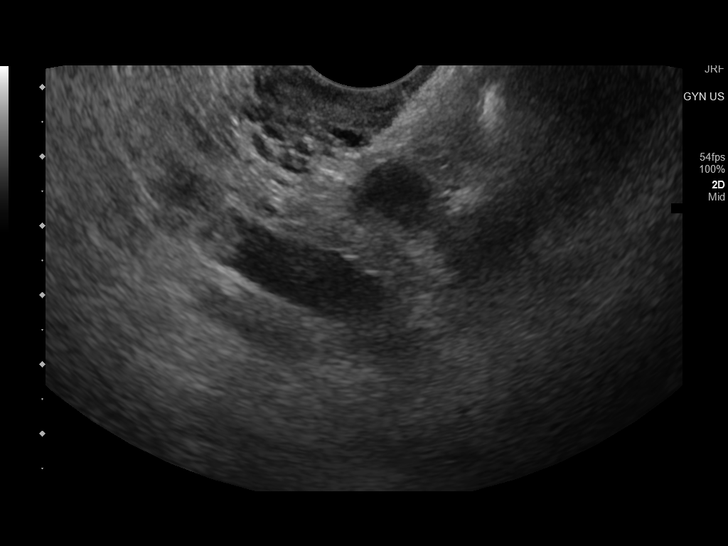
[im 67/81]
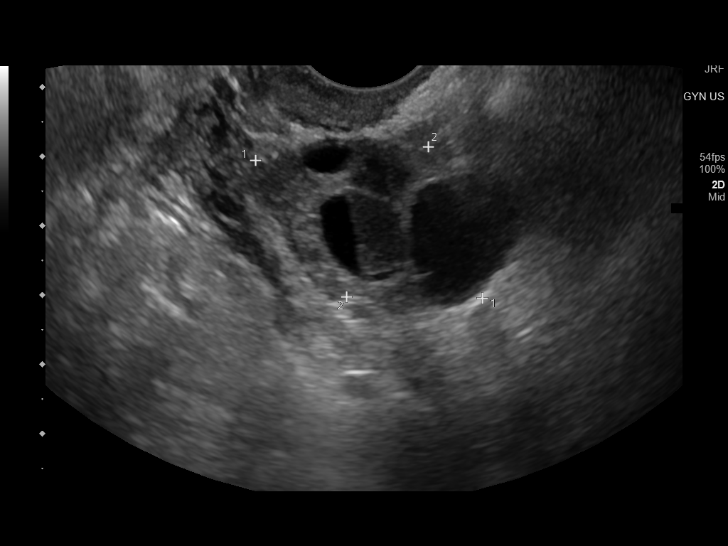
[im 74/81]
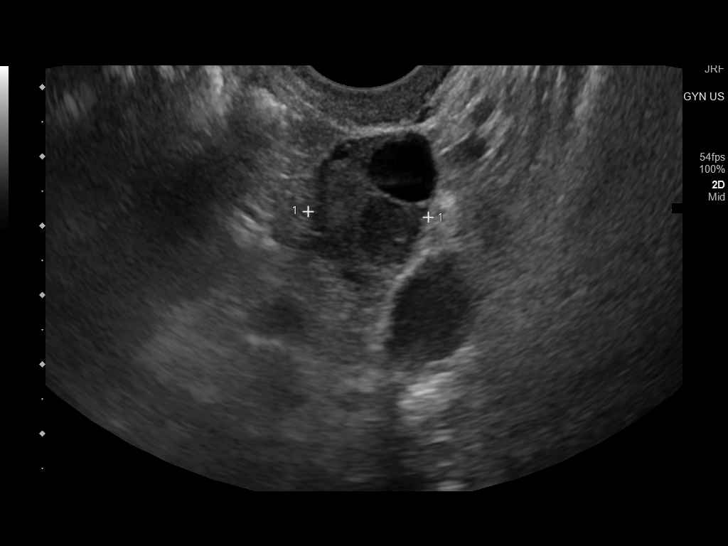
[im 81/81]
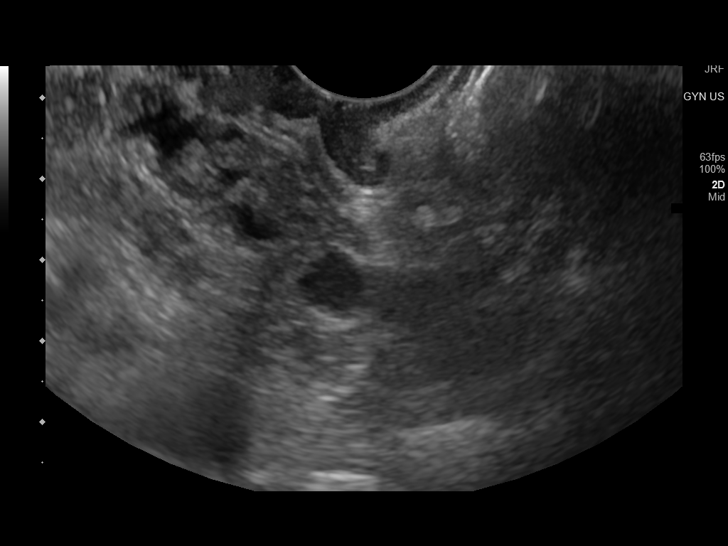

[14 of 25 positions shown; findings below may reference images not displayed]

FINDINGS: Uterus

Surgically absent.

Endometrium

Surgically absent

Right ovary

Measurements: 2 x 1 x 1.3 cm = volume: 1.0 mL. Normal appearance/no
adnexal mass.

Left ovary

Measurements: 3.8 x 2.5 x 1.7 cm = volume: 9 mL. 1.5 x 1.2 x 1.3 cm
complex left ovarian cyst

Other findings

No abnormal free fluid.
IMPRESSION: 1. Status post hysterectomy.
2. 1.5 cm complex left ovarian cysts, possibly hemorrhagic given
appearance of fluid fluid level; recommend 6-12 week sonographic
follow-up.

## 2021-06-20 ENCOUNTER — Other Ambulatory Visit: Payer: Self-pay | Admitting: Family

## 2021-06-20 DIAGNOSIS — R140 Abdominal distension (gaseous): Secondary | ICD-10-CM

## 2021-06-26 ENCOUNTER — Ambulatory Visit
Admission: RE | Admit: 2021-06-26 | Discharge: 2021-06-26 | Disposition: A | Discharge status: Home / Self Care | Payer: BC Managed Care – PPO | Source: Ambulatory Visit | Attending: Family | Admitting: Family

## 2021-06-26 ENCOUNTER — Other Ambulatory Visit: Payer: Self-pay

## 2021-06-26 DIAGNOSIS — Z9189 Other specified personal risk factors, not elsewhere classified: Secondary | ICD-10-CM | POA: Diagnosis present

## 2021-06-26 IMAGING — MG DIGITAL DIAGNOSTIC BILAT W/ TOMO W/ CAD
6 of 10 series · 6 of 30 positions shown · non-contrast
Comparison: Previous exams.

CLINICAL DATA: 50-year-old female with bilateral breast pain,
involving the superior right breast intermittently and the left
breast specifically at the 12 o'clock position. History of benign
MRI guided breast biopsies [DATE], 2 on the right and 1 on the
left.



[R CC synth-2D]
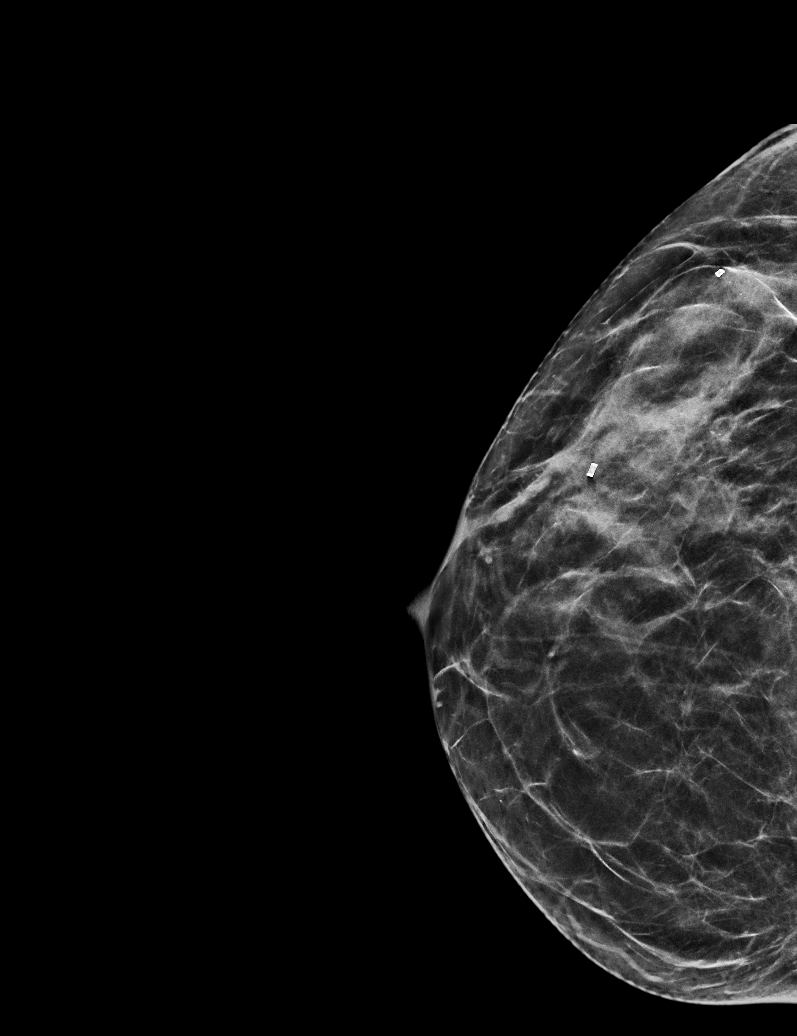

[L MLO synth-2D (1 of 2)]
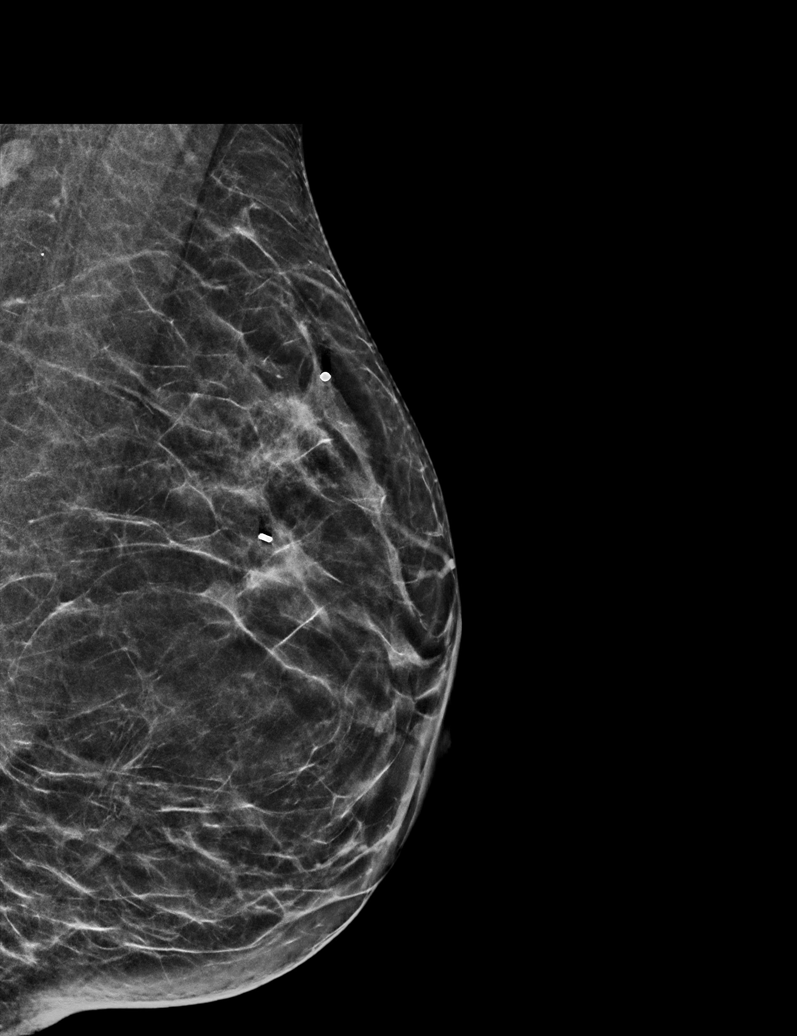

[L MLO synth-2D (2 of 2)]
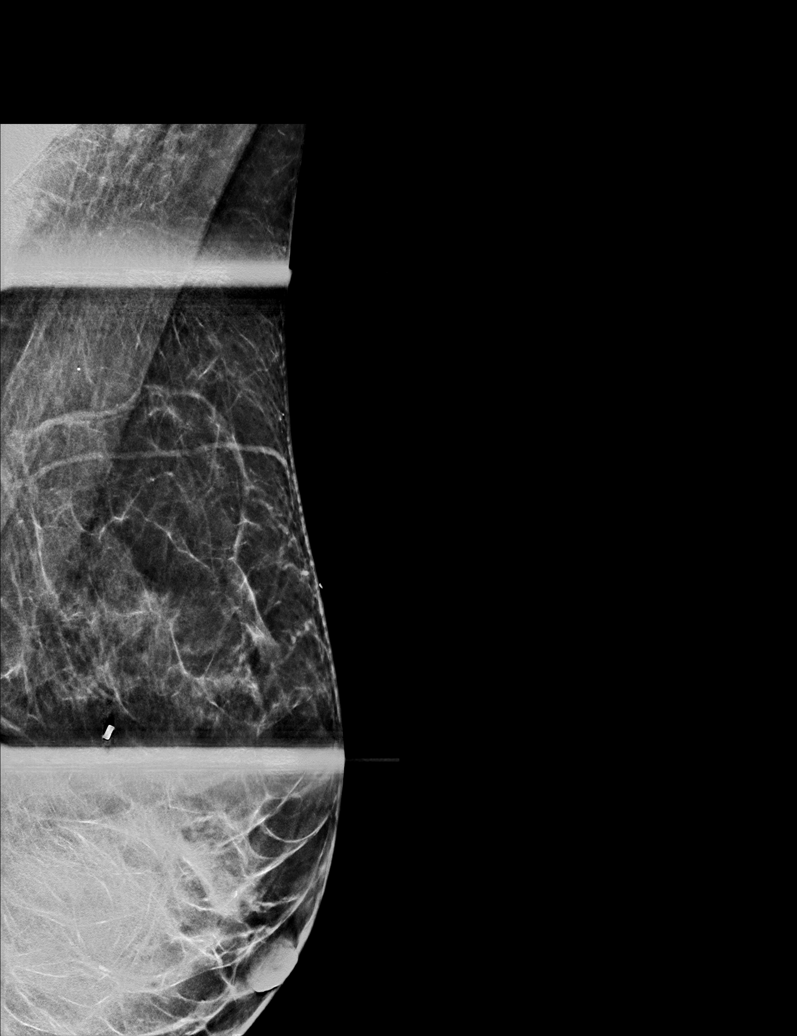

[L CC synth-2D]
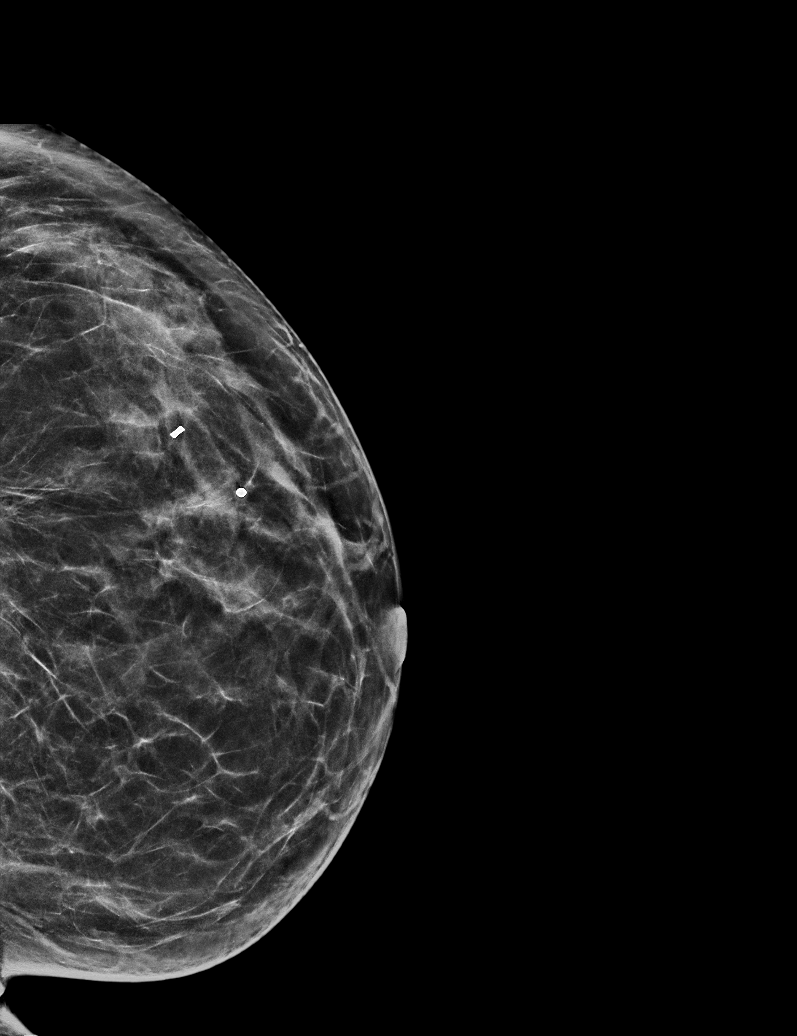

[R MLO synth-2D]
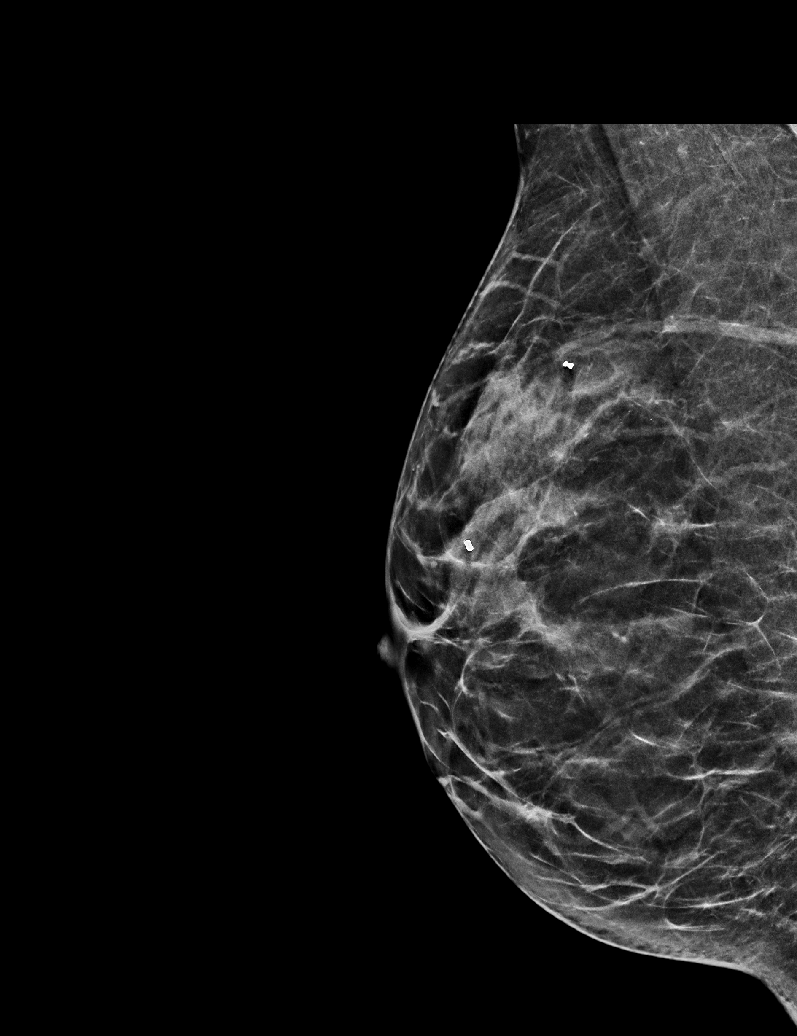

[L MLO tomo · tomo slice 29/56.0]
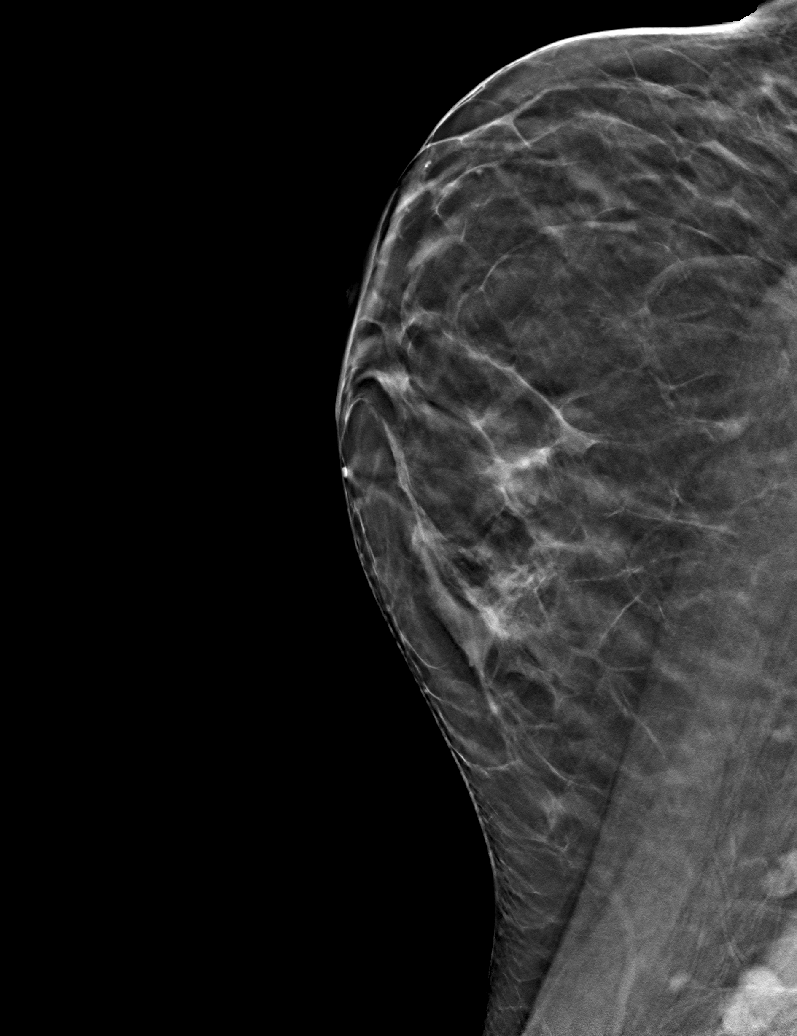

[6 of 30 positions shown; findings below may reference images not displayed]

ACR Breast Density Category c: The breast tissue is heterogeneously
dense, which may obscure small masses.
FINDINGS: No suspicious masses or calcifications are seen in either breast.
Spot compression tomograms were performed over the area of focal
pain in the upper left breast with no definite abnormality seen.

Targeted ultrasound of the bilateral breast was performed at the
sites of pain. No suspicious masses or abnormality seen in either
breast, only normal-appearing/heterogeneous fibroglandular tissue
identified.
IMPRESSION: 1. No mammographic or sonographic abnormalities at the sites of pain
in each breast.

2.  No mammographic evidence of malignancy in either breast.

RECOMMENDATION:
1. Recommend further management of bilateral breast tenderness be
based on clinical assessment.

2.  Screening mammogram in one year.(Code:[7N])

I have discussed the findings and recommendations with the patient.
If applicable, a reminder letter will be sent to the patient
regarding the next appointment.

BI-RADS CATEGORY  1: Negative.

## 2021-06-26 IMAGING — US US BREAST*R* LIMITED INC AXILLA
1 series · 6 of 6 positions shown · non-contrast
Comparison: Previous exams.

CLINICAL DATA: 50-year-old female with bilateral breast pain,
involving the superior right breast intermittently and the left
breast specifically at the 12 o'clock position. History of benign
MRI guided breast biopsies [DATE], 2 on the right and 1 on the
left.



[Series 1: us breast*right* limited inc axilla · 0.06mm/px · 6 of 6 slices shown]
[im 1/6]
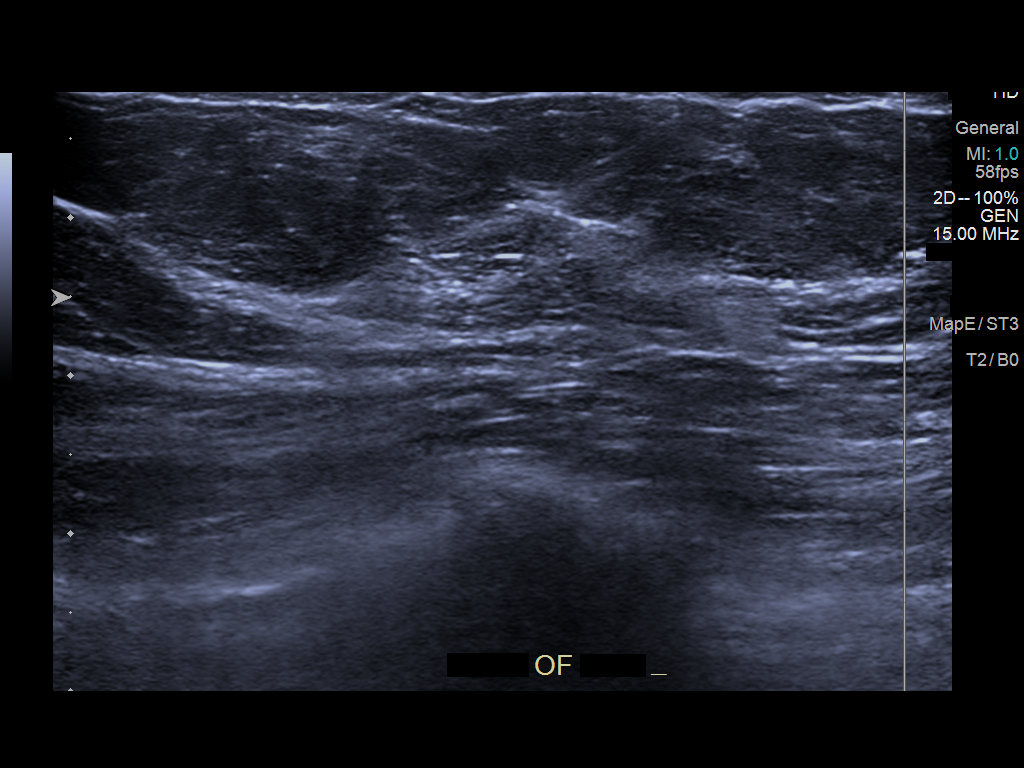
[im 2/6]
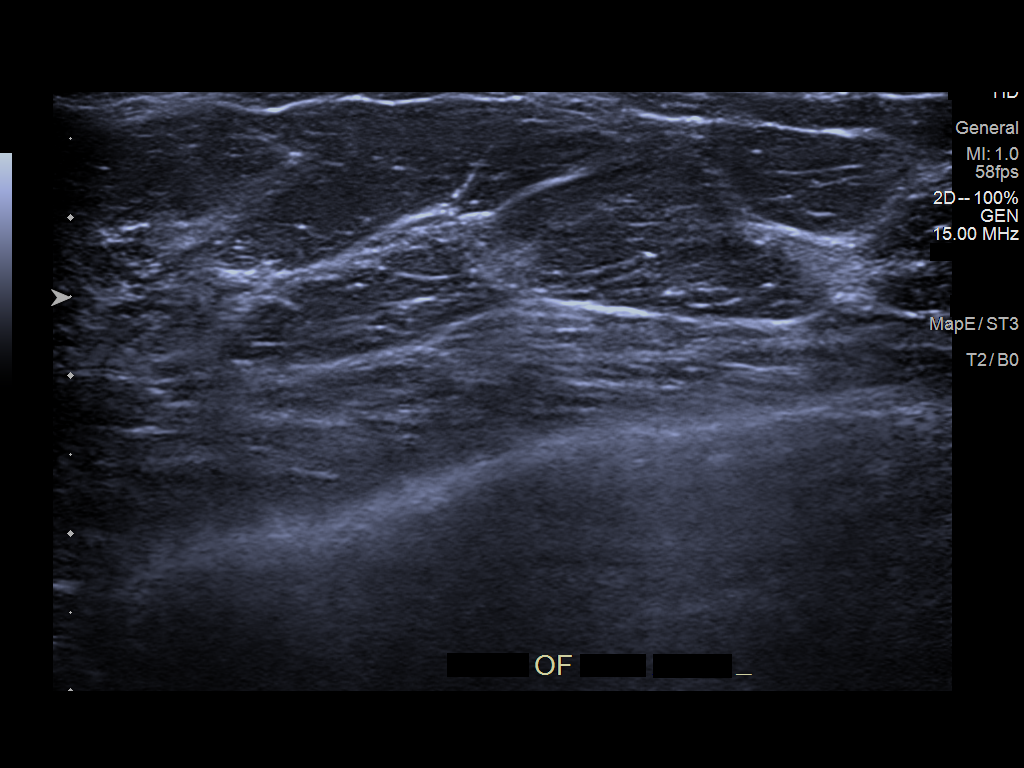
[im 3/6]
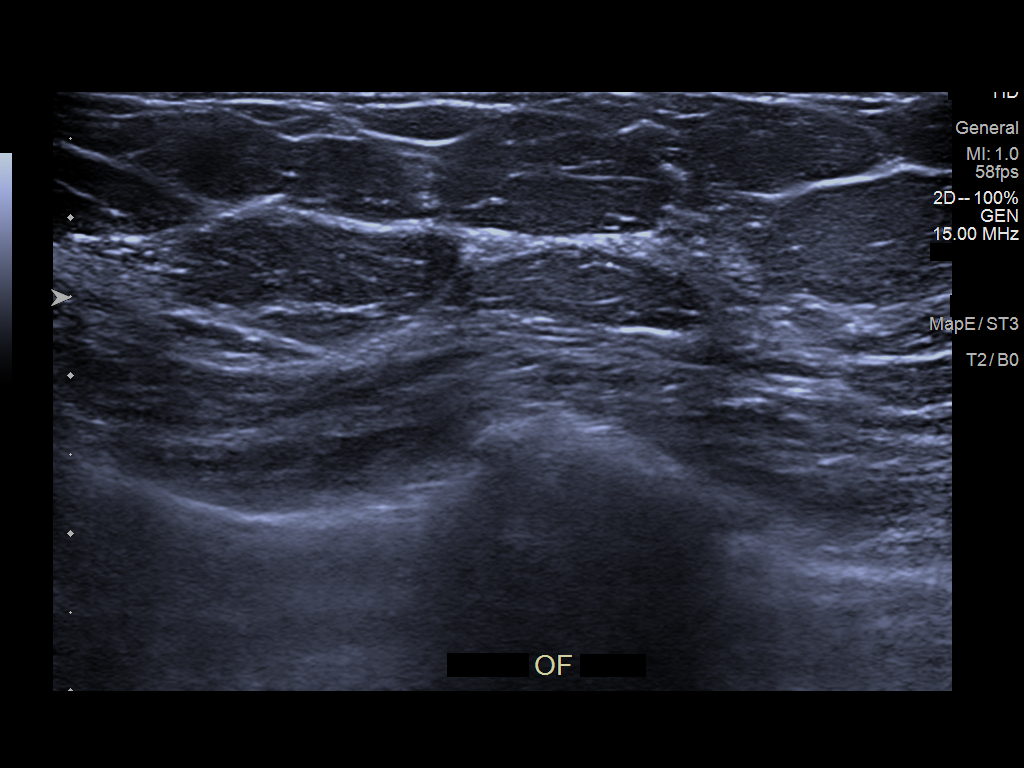
[im 4/6]
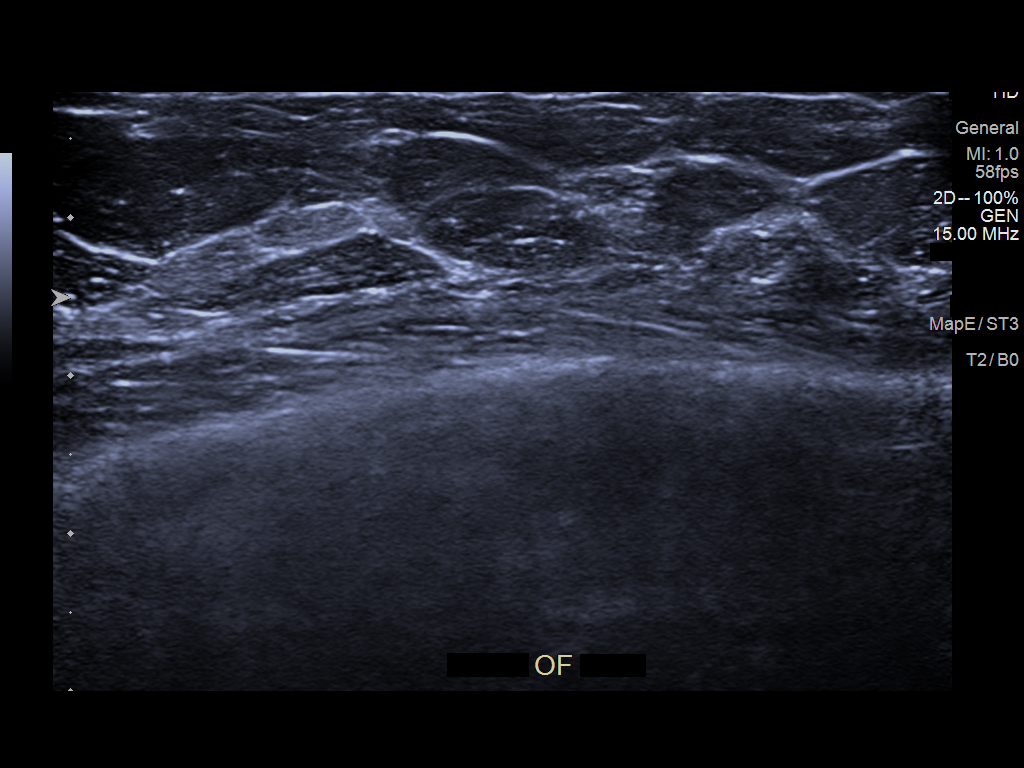
[im 5/6]
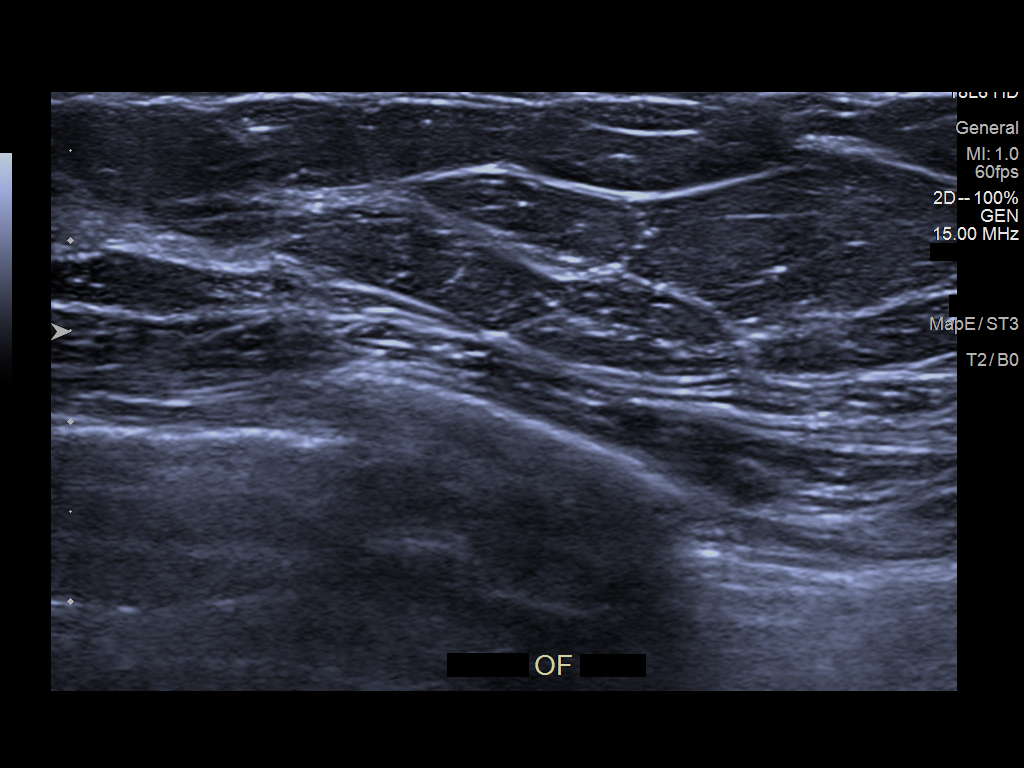
[im 6/6]
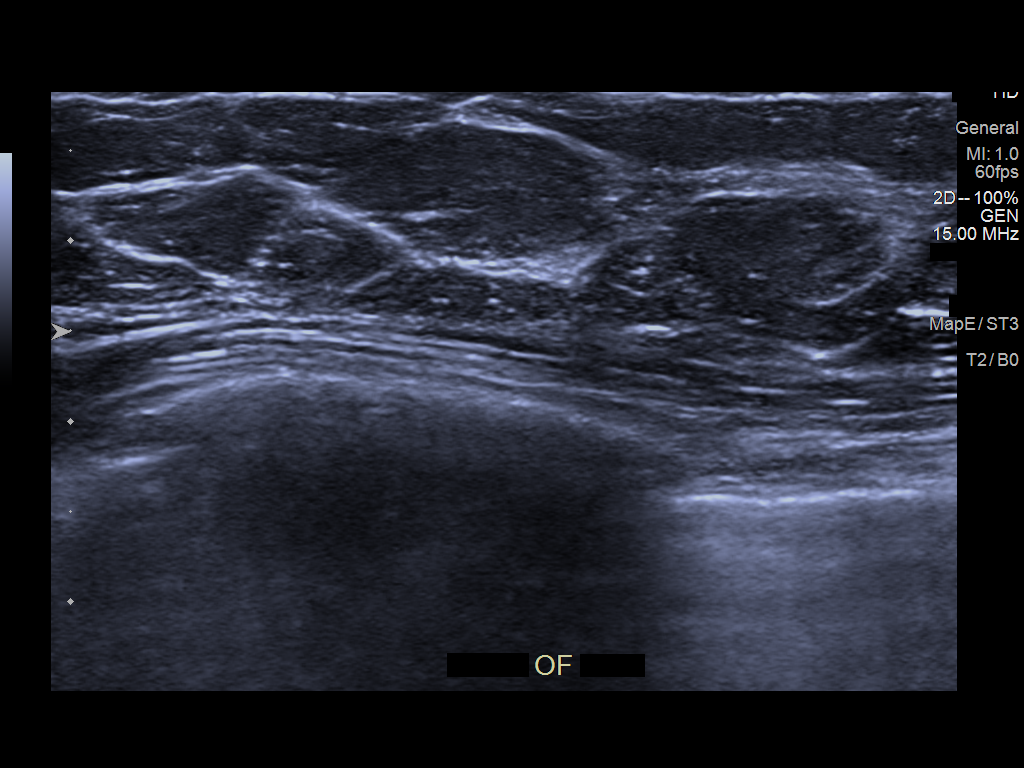

[6 of 6 positions shown; findings below may reference images not displayed]

ACR Breast Density Category c: The breast tissue is heterogeneously
dense, which may obscure small masses.
FINDINGS: No suspicious masses or calcifications are seen in either breast.
Spot compression tomograms were performed over the area of focal
pain in the upper left breast with no definite abnormality seen.

Targeted ultrasound of the bilateral breast was performed at the
sites of pain. No suspicious masses or abnormality seen in either
breast, only normal-appearing/heterogeneous fibroglandular tissue
identified.
IMPRESSION: 1. No mammographic or sonographic abnormalities at the sites of pain
in each breast.

2.  No mammographic evidence of malignancy in either breast.

RECOMMENDATION:
1. Recommend further management of bilateral breast tenderness be
based on clinical assessment.

2.  Screening mammogram in one year.(Code:[7N])

I have discussed the findings and recommendations with the patient.
If applicable, a reminder letter will be sent to the patient
regarding the next appointment.

BI-RADS CATEGORY  1: Negative.

## 2021-06-26 IMAGING — US US BREAST*L* LIMITED INC AXILLA
1 series · 4 of 4 positions shown · non-contrast
Comparison: Previous exams.

CLINICAL DATA: 50-year-old female with bilateral breast pain,
involving the superior right breast intermittently and the left
breast specifically at the 12 o'clock position. History of benign
MRI guided breast biopsies [DATE], 2 on the right and 1 on the
left.



[Series 1: us breast*left* limited inc axilla · 0.07mm/px · 4 of 4 slices shown]
[im 1/4]
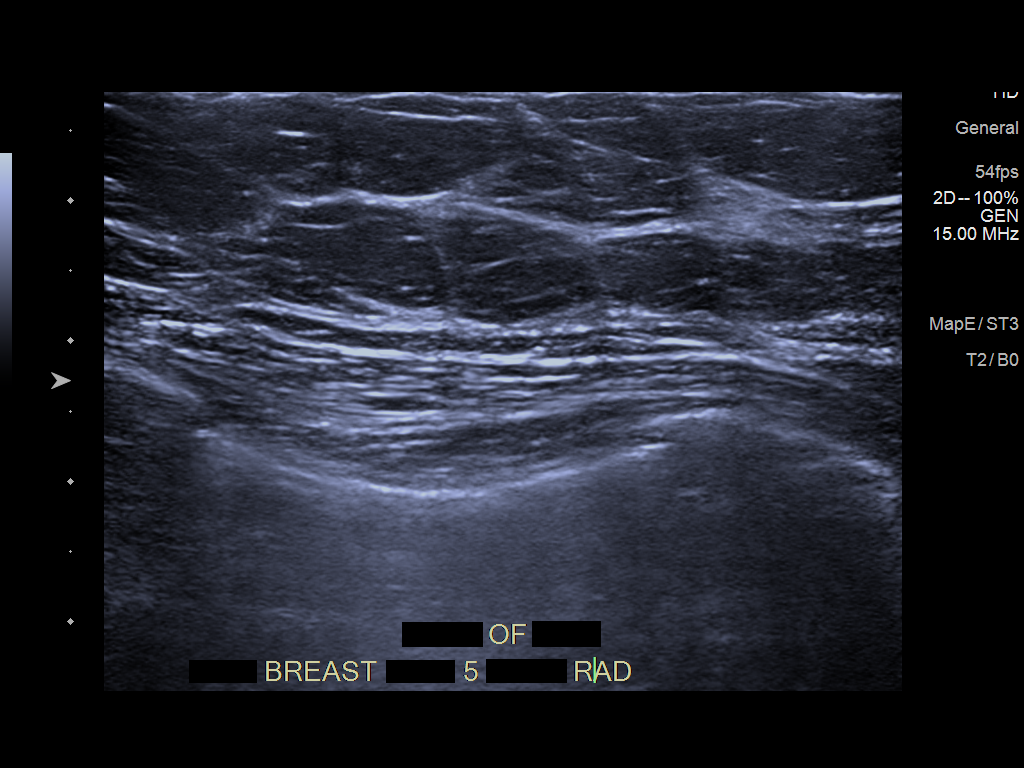
[im 2/4]
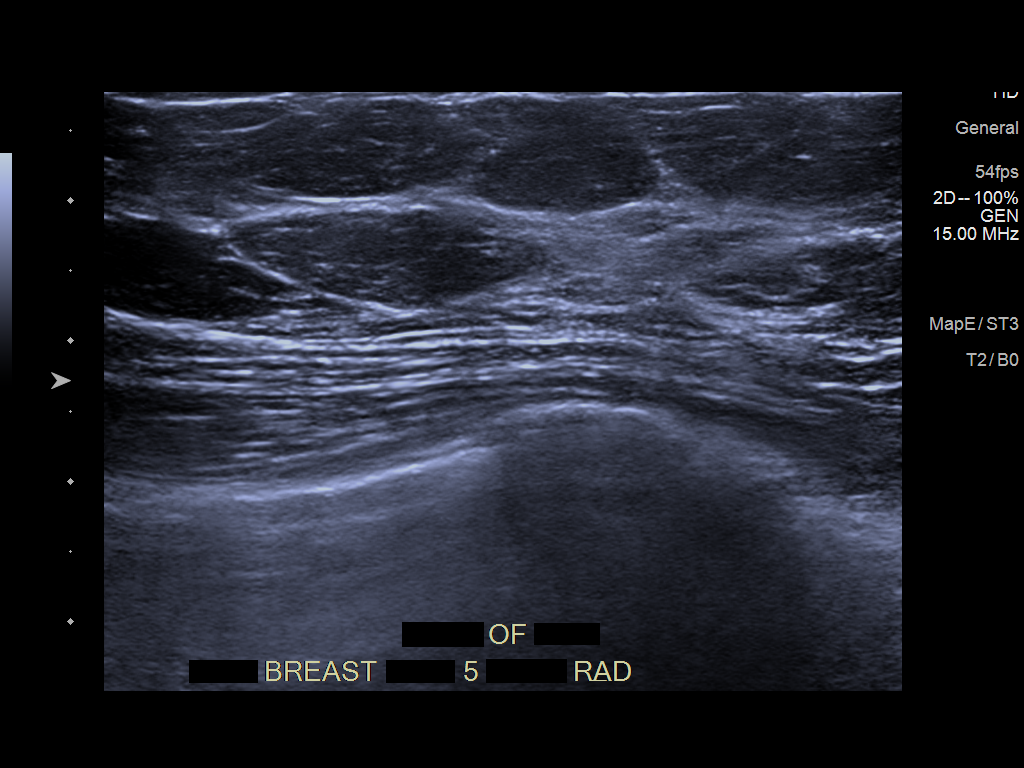
[im 3/4]
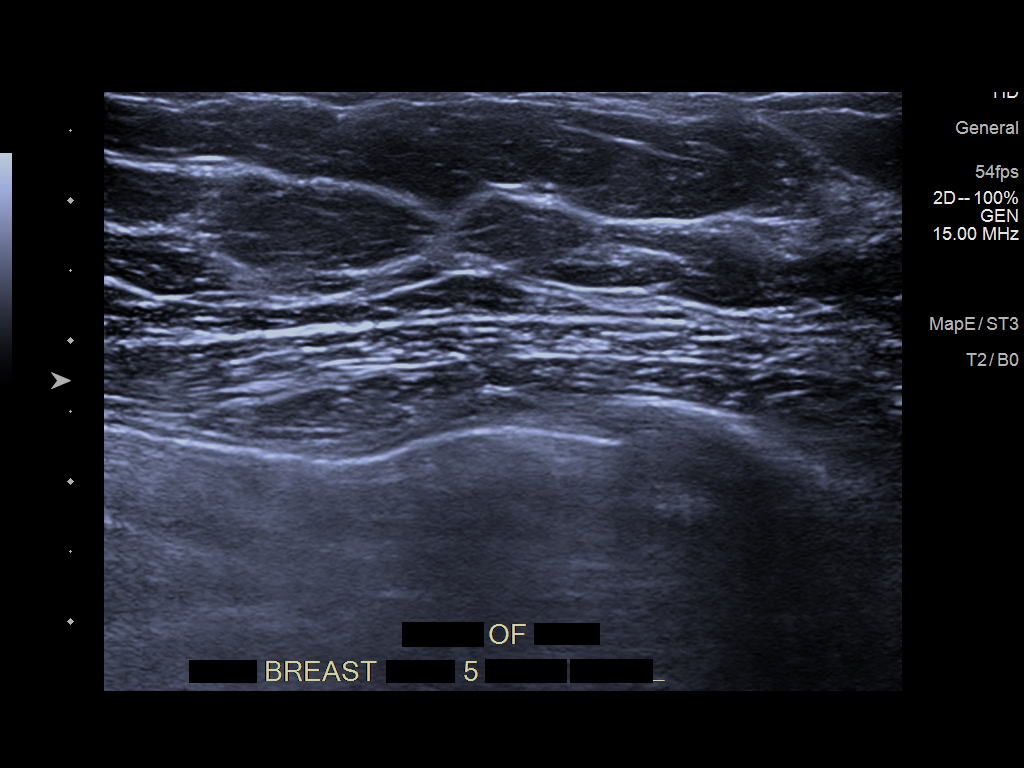
[im 4/4]
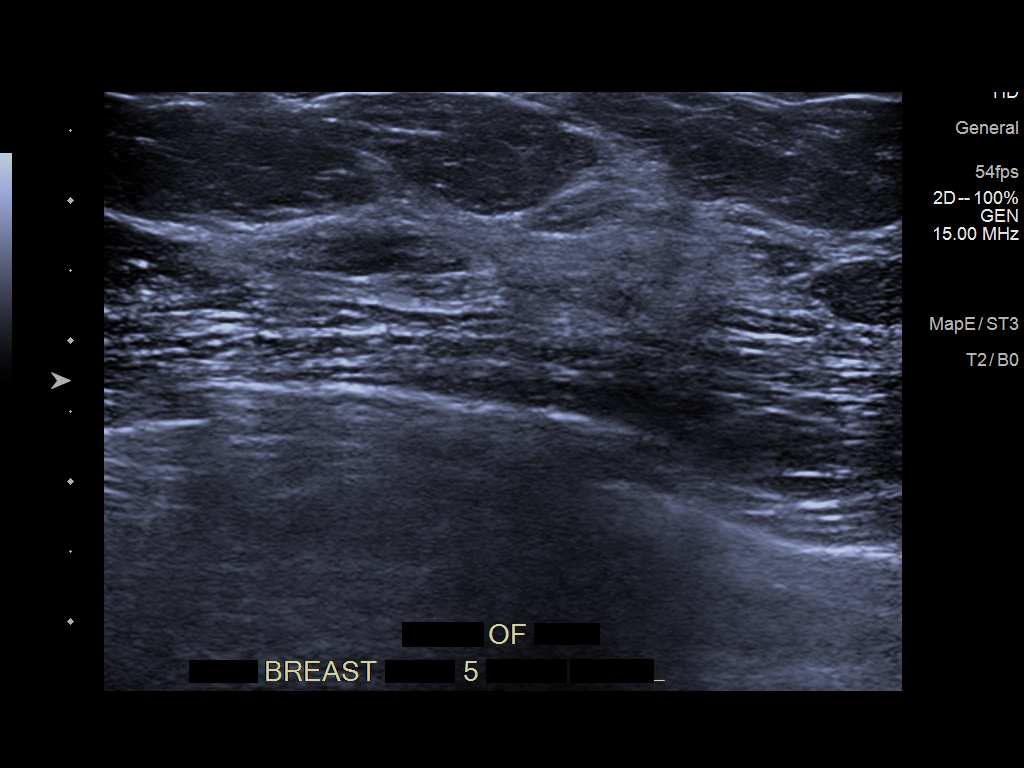

[4 of 4 positions shown; findings below may reference images not displayed]

ACR Breast Density Category c: The breast tissue is heterogeneously
dense, which may obscure small masses.
FINDINGS: No suspicious masses or calcifications are seen in either breast.
Spot compression tomograms were performed over the area of focal
pain in the upper left breast with no definite abnormality seen.

Targeted ultrasound of the bilateral breast was performed at the
sites of pain. No suspicious masses or abnormality seen in either
breast, only normal-appearing/heterogeneous fibroglandular tissue
identified.
IMPRESSION: 1. No mammographic or sonographic abnormalities at the sites of pain
in each breast.

2.  No mammographic evidence of malignancy in either breast.

RECOMMENDATION:
1. Recommend further management of bilateral breast tenderness be
based on clinical assessment.

2.  Screening mammogram in one year.(Code:[7N])

I have discussed the findings and recommendations with the patient.
If applicable, a reminder letter will be sent to the patient
regarding the next appointment.

BI-RADS CATEGORY  1: Negative.

## 2021-07-01 ENCOUNTER — Inpatient Hospital Stay: Payer: BC Managed Care – PPO

## 2021-07-01 ENCOUNTER — Inpatient Hospital Stay: Payer: BC Managed Care – PPO | Admitting: Licensed Clinical Social Worker

## 2021-08-27 ENCOUNTER — Encounter: Payer: Self-pay | Admitting: Certified Nurse Midwife

## 2021-09-02 ENCOUNTER — Telehealth: Payer: Self-pay | Admitting: Internal Medicine

## 2021-09-02 NOTE — Telephone Encounter (Signed)
Pt needs to schedule yearly CPE with me thank you With yearly labs including vitamin D and B12 ask if wants hiv, hep C but I dont think she is high risk? Please sch appt and order labs  Thank you

## 2021-09-04 NOTE — Telephone Encounter (Signed)
Left message to return call 

## 2021-09-09 ENCOUNTER — Encounter: Payer: Self-pay | Admitting: Certified Nurse Midwife

## 2021-09-09 ENCOUNTER — Ambulatory Visit: Payer: BC Managed Care – PPO | Admitting: Certified Nurse Midwife

## 2021-09-09 ENCOUNTER — Other Ambulatory Visit: Payer: Self-pay

## 2021-09-09 VITALS — BP 118/82 | HR 55 | Ht 69.0 in | Wt 154.6 lb

## 2021-09-09 DIAGNOSIS — R140 Abdominal distension (gaseous): Secondary | ICD-10-CM | POA: Diagnosis not present

## 2021-09-09 DIAGNOSIS — Z8742 Personal history of other diseases of the female genital tract: Secondary | ICD-10-CM

## 2021-09-09 NOTE — Patient Instructions (Addendum)
Kegel Exercises Kegel exercises can help strengthen your pelvic floor muscles. The pelvic floor is a group of muscles that support your rectum, small intestine, and bladder. In females, pelvic floor muscles also help support the uterus. These muscles help you control the flow of urine and stool (feces). Kegel exercises are painless and simple. They do not require any equipment. Your provider may suggest Kegel exercises to: Improve bladder and bowel control. Improve sexual response. Improve weak pelvic floor muscles after surgery to remove the uterus (hysterectomy) or after pregnancy, in females. Improve weak pelvic floor muscles after prostate gland removal or surgery, in males. Kegel exercises involve squeezing your pelvic floor muscles. These are the same muscles you squeeze when you try to stop the flow of urine or keep from passing gas. The exercises can be done while sitting, standing, or lying down, but it is best to vary your position. Ask your health care provider which exercises are safe for you. Do exercises exactly as told by your health care provider and adjust them as directed. Do not begin these exercises until told by your health care provider. Exercises How to do Kegel exercises: Squeeze your pelvic floor muscles tight. You should feel a tight lift in your rectal area. If you are a female, you should also feel a tightness in your vaginal area. Keep your stomach, buttocks, and legs relaxed. Hold the muscles tight for up to 10 seconds. Breathe normally. Relax your muscles for up to 10 seconds. Repeat as told by your health care provider. Repeat this exercise daily as told by your health care provider. Continue to do this exercise for at least 4-6 weeks, or for as long as told by your health care provider. You may be referred to a physical therapist who can help you learn more about how to do Kegel exercises. Depending on your condition, your health care provider may  recommend: Varying how long you squeeze your muscles. Doing several sets of exercises every day. Doing exercises for several weeks. Making Kegel exercises a part of your regular exercise routine. This information is not intended to replace advice given to you by your health care provider. Make sure you discuss any questions you have with your health care provider. Document Revised: 01/16/2021 Document Reviewed: 01/16/2021 Elsevier Patient Education  2022 [employer] Pelvic Pain, Female Pelvic pain is pain in your lower belly (abdomen), below your belly button and between your hips. The pain may: Start all of a sudden (be acute). Keep coming back (be recurring). Last a long time (become chronic). Pelvic pain that lasts longer than 6 months is called chronic pelvic pain. There are many causes of pelvic pain. Sometimes the cause of pelvic pain is not known. Follow these instructions at home:  Take over-the-counter and prescription medicines only as told by your doctor. Rest as told by your doctor. Do not have sex if it hurts. Keep a journal of your pelvic pain. Write down: When the pain started. Where the pain is located. What seems to make the pain better or worse, such as food or your monthly period (menstrual cycle). Any symptoms you have along with the pain. Keep all follow-up visits. Contact a doctor if: Medicine does not help your pain, or your pain comes back. You have new symptoms. You have unusual discharge or bleeding from your vagina. You have a fever or chills. You are having trouble pooping (constipation). You have blood in your pee (urine) or poop (stool). Your pee smells bad. You  feel weak or light-headed. Get help right away if: You have sudden pain that is very bad. You have very bad pain and also have any of these symptoms: A fever. Feeling like you may vomit (nauseous). Vomiting. Being very sweaty. You faint. These symptoms may be an emergency. Get help  right away. Call your local emergency services (911 in the [place] Do not wait to see if the symptoms will go away. Do not drive yourself to the hospital. Summary Pelvic pain is pain in your lower belly (abdomen), below your belly button and between your hips. There are many causes of pelvic pain. Keep a journal of your pelvic pain. This information is not intended to replace advice given to you by your health care provider. Make sure you discuss any questions you have with your health care provider. Document Revised: 01/14/2021 Document Reviewed: 01/14/2021 Elsevier Patient Education  2022 [employer].

## 2021-09-09 NOTE — Progress Notes (Signed)
GYN ENCOUNTER NOTE  Subjective:       Kaitlyn Kane is a 50 y.o. No obstetric history on file. female is here for gynecologic evaluation of the following issues:  1. .  Vaginal tissue protruding  2.pelvic pain    Gynecologic History No LMP recorded. Patient has had a hysterectomy. Contraception: status post hysterectomy Last Pap: 06/01/2018. Results were: normal Last mammogram: 06/2021. Results were: normal  Obstetric History OB History  No obstetric history on file.    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     Multiple Vitamins-Minerals (MULTIVITAMIN WOMENS 50+ ADV PO) Take by mouth.     Probiotic Product (PROBIOTIC PO) Take by mouth.     Current Facility-Administered Medications on File Prior to Visit  Medication Dose Route Frequency Provider Last Rate Last Admin   cyanocobalamin ((VITAMIN B-12)) injection 1,000 mcg  1,000 mcg Intramuscular Q30 days McLean-Scocuzza, Nino Glow, MD   1,000 mcg at 06/18/20 1457    No Known Allergies  Social History   Socioeconomic History   Marital status: Married    Spouse name: Not on file   Number of children: Not on file   Years of education: Not on file   Highest education level: Not on file  Occupational History   Not on file  Tobacco Use   Smoking status: Never   Smokeless tobacco: Never  Substance and Sexual Activity   Alcohol use: Yes    Drug use: Not Currently   Sexual activity: Yes  Other Topics Concern   Not on file  Social History Narrative   3 kids   Married    Owns guns, wears seat belt, safe in relationship   [employer]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[employer]    Social Determinants of Radio broadcast assistant Strain: Not on file  Food Insecurity: Not on file  Transportation Needs: Not on file  Physical Activity: Not on file  Stress: Not on file  Social Connections: Not on file  Intimate Partner Violence: Not on file    Family History  Problem Relation Age of Onset   Hyperlipidemia Mother    Hypertension Mother    Breast cancer Mother    Other Mother        covid 21 died due to this 12-07-19   Arthritis Father    Cancer Father        neuroendocrine tumor small intestine to liver met   COPD Father    Hearing loss Father    Depression Sister    Diabetes Sister    Seizures Sister    Depression Brother    Drug abuse Brother    Stroke Maternal Grandmother    Cancer Maternal Grandmother        testicular    Early death Paternal Grandfather  The following portions of the patient's history were reviewed and updated as appropriate: allergies, current medications, past family history, past medical history, past social history, past surgical history and problem list.  Review of Systems Review of Systems - Negative except as mentioned in HPI Review of Systems - General ROS: negative for - chills, fatigue, fever, hot flashes, malaise or night sweats Hematological and Lymphatic ROS: negative for - bleeding problems or swollen lymph nodes Gastrointestinal ROS: negative for - abdominal pain, blood in stools, change in bowel habits and nausea/vomiting Musculoskeletal ROS: negative for - joint pain, muscle pain or muscular weakness Genito-Urinary ROS: negative for - change in menstrual cycle, dysmenorrhea, dyspareunia, dysuria, genital discharge, genital ulcers, hematuria, incontinence, irregular/heavy menses, nocturia . Positive  for pelvic pain   Objective:   BP 118/82    Pulse (!) 55    Ht $R'5\' 9"'sA$  (1.753 m)    Wt 154 lb 9.6 oz (70.1 kg)    BMI 22.83 kg/m  CONSTITUTIONAL: Well-developed, well-nourished female in no acute distress.  HENT:  Normocephalic, atraumatic.  NECK: Normal range of motion, supple, no masses.  Normal thyroid.  SKIN: Skin is warm and dry. No rash noted. Not diaphoretic. No erythema. No pallor. [place]: Alert and oriented to person, place, and time. PSYCHIATRIC: Normal mood and affect. Normal behavior. Normal judgment and thought content. CARDIOVASCULAR:Not Examined RESPIRATORY: Not Examined BREASTS: Not Examined ABDOMEN: Soft, non distended; Non tender.  No Organomegaly. PELVIC:  External Genitalia: Normal  BUS: Normal  Vagina: Normal , vaginal tissue protrudes with coughing, no signs bartholin glands cyst. No rectocele or cystocele noted   Cervix: Normal  Uterus: absent  Adnexa: Normal, no tenderness, no masses palpated  RV: Normal   Bladder: Nontender MUSCULOSKELETAL: Normal range of motion. No tenderness.  No cyanosis, clubbing, or edema.     Assessment:   Pelvic pain Vaginal tissue prolapse    Plan:   Pt has history of ovarian cyst noted on u/s 05/2021. Recommend repeat u/s to evaluate for changes that maybe contributing to pain. Pt had MRI for hip pain with noted "Prominent varicosities adjacent  where uterus would be. Discussed Pelvic Congestive Syndrome and treatment options. Encouraged use of Kegel exercise. Orders placed for u/s will follow up with results.   Philip Aspen, CNM

## 2021-09-10 NOTE — Telephone Encounter (Signed)
No answer, no voicemail.  Letter sent to call and schedule physical and labs at least 3 days prior.

## 2021-09-30 ENCOUNTER — Other Ambulatory Visit: Payer: Self-pay | Admitting: Certified Nurse Midwife

## 2021-09-30 ENCOUNTER — Ambulatory Visit (INDEPENDENT_AMBULATORY_CARE_PROVIDER_SITE_OTHER): Payer: BC Managed Care – PPO

## 2021-09-30 ENCOUNTER — Other Ambulatory Visit: Payer: Self-pay

## 2021-09-30 DIAGNOSIS — Z8742 Personal history of other diseases of the female genital tract: Secondary | ICD-10-CM

## 2021-10-06 ENCOUNTER — Encounter: Payer: Self-pay | Admitting: Certified Nurse Midwife

## 2021-10-06 ENCOUNTER — Other Ambulatory Visit: Payer: Self-pay | Admitting: Certified Nurse Midwife

## 2021-10-06 DIAGNOSIS — N83201 Unspecified ovarian cyst, right side: Secondary | ICD-10-CM

## 2021-10-21 ENCOUNTER — Encounter: Payer: Self-pay | Admitting: Internal Medicine

## 2021-10-21 ENCOUNTER — Other Ambulatory Visit: Payer: Self-pay | Admitting: Internal Medicine

## 2021-10-21 DIAGNOSIS — E538 Deficiency of other specified B group vitamins: Secondary | ICD-10-CM

## 2021-10-21 DIAGNOSIS — Z0000 Encounter for general adult medical examination without abnormal findings: Secondary | ICD-10-CM

## 2021-10-21 DIAGNOSIS — Z01818 Encounter for other preprocedural examination: Secondary | ICD-10-CM

## 2021-10-21 DIAGNOSIS — Z13220 Encounter for screening for lipoid disorders: Secondary | ICD-10-CM

## 2021-10-21 DIAGNOSIS — Z1329 Encounter for screening for other suspected endocrine disorder: Secondary | ICD-10-CM

## 2021-10-21 DIAGNOSIS — Z1389 Encounter for screening for other disorder: Secondary | ICD-10-CM

## 2021-10-21 DIAGNOSIS — R739 Hyperglycemia, unspecified: Secondary | ICD-10-CM

## 2021-10-21 DIAGNOSIS — Z13818 Encounter for screening for other digestive system disorders: Secondary | ICD-10-CM

## 2021-11-04 ENCOUNTER — Other Ambulatory Visit: Payer: Self-pay

## 2021-11-04 ENCOUNTER — Ambulatory Visit
Admission: RE | Admit: 2021-11-04 | Discharge: 2021-11-04 | Disposition: A | Discharge status: Home / Self Care | Payer: BC Managed Care – PPO | Source: Ambulatory Visit | Attending: Internal Medicine | Admitting: Internal Medicine

## 2021-11-04 DIAGNOSIS — Z1231 Encounter for screening mammogram for malignant neoplasm of breast: Secondary | ICD-10-CM

## 2021-11-04 DIAGNOSIS — N6019 Diffuse cystic mastopathy of unspecified breast: Secondary | ICD-10-CM

## 2021-11-04 IMAGING — MR MR BREAST BILAT WO/W CM
8 of 12 series · 33 of 48 positions shown · IV contrast (7 ml gadavist)
Comparison: Previous exam(s).

CLINICAL DATA: Screening exam. Patient has a history prior benign
MRI guided biopsies. History of breast cancer in the patient's
mother at age 80.

EXAM:
BILATERAL BREAST MRI WITH AND WITHOUT CONTRAST
TECHNIQUE: Multiplanar, multisequence MR images of both breasts were obtained
prior to and following the intravenous administration of 7 ml of
Gadavist

[Series 2: t2_tirm_tra ipat (a-p) · axial · 3.0mm · 0.70mm/px · 1 of 57 slices shown]
[im 1/57]
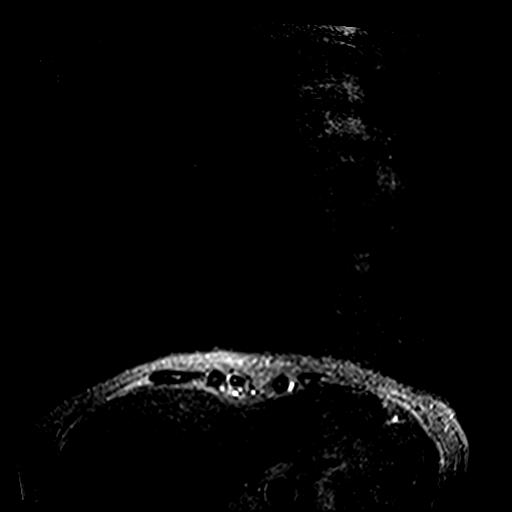

[Series 3: fl3d pre-cm no · axial · non-contrast · 1.2mm · 0.89mm/px · z∈[-110,+61]mm · 5 of 144 slices shown]
[im 1/144]
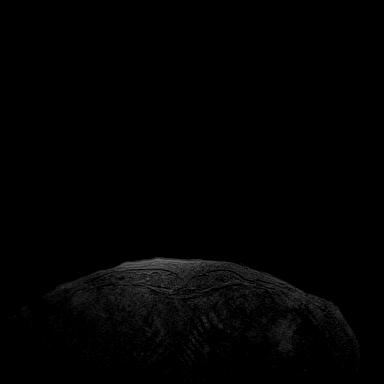
[im 36/144]
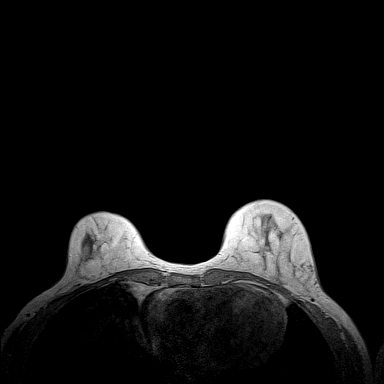
[im 72/144]
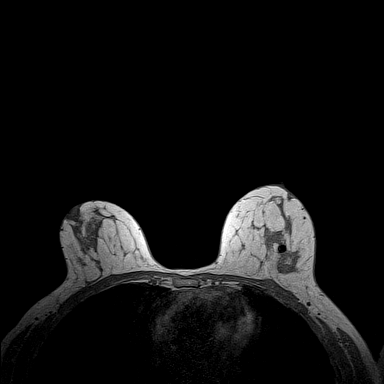
[im 108/144]
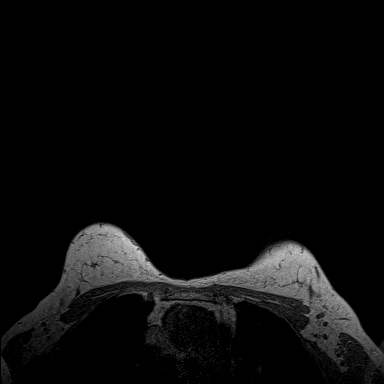
[im 144/144]
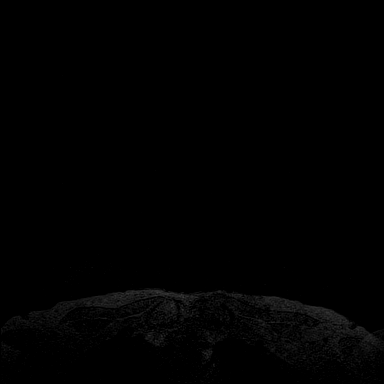

[Series 4: fl3d pre-cm · axial · non-contrast · 1.2mm · 0.89mm/px · z∈[-110,+61]mm · 5 of 144 slices shown]
[im 1/144]
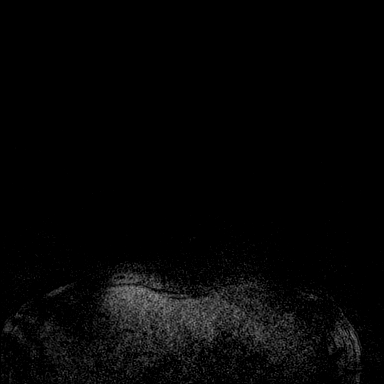
[im 36/144]
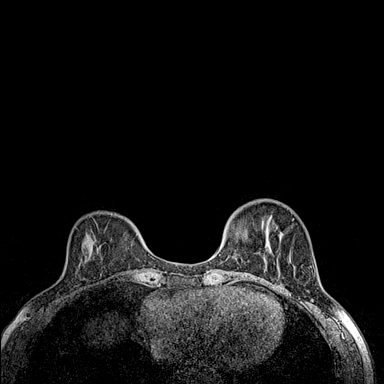
[im 72/144]
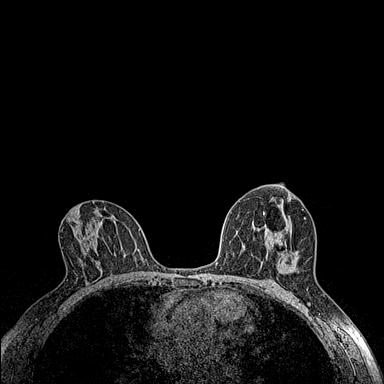
[im 108/144]
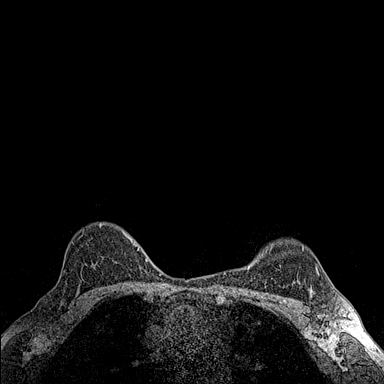
[im 144/144]
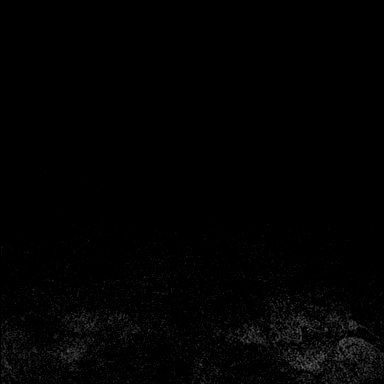

[Series 5: fl3d post-cm 20 · axial · 1.2mm · 0.89mm/px · z∈[-110,+61]mm · 5 of 144 slices shown (1 of 3)]
[im 1/144]
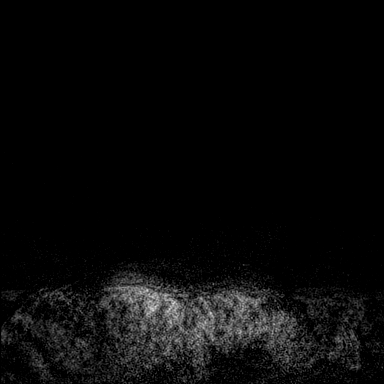
[im 36/144]
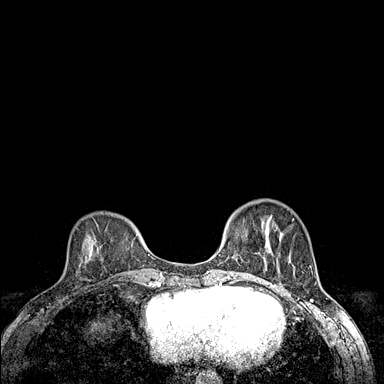
[im 72/144]
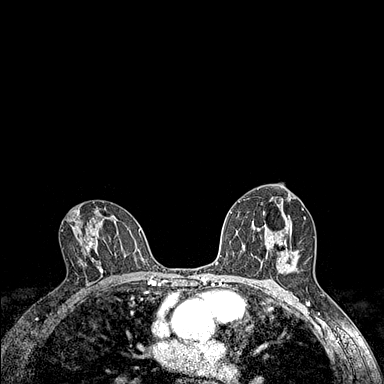
[im 108/144]
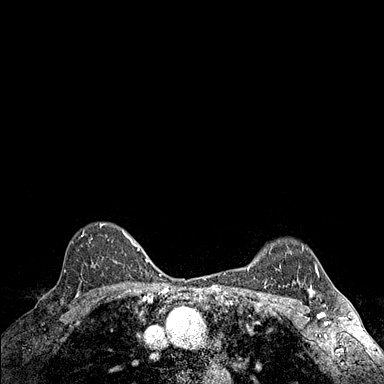
[im 144/144]
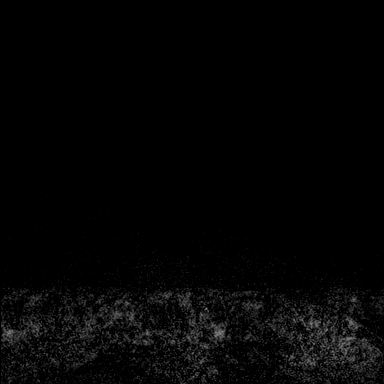

[Series 6: fl3d post-cm 20 · axial · 1.2mm · 0.89mm/px · z∈[-110,+61]mm · 5 of 144 slices shown (2 of 3)]
[im 1/144]
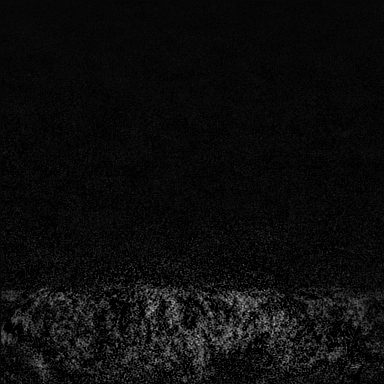
[im 36/144]
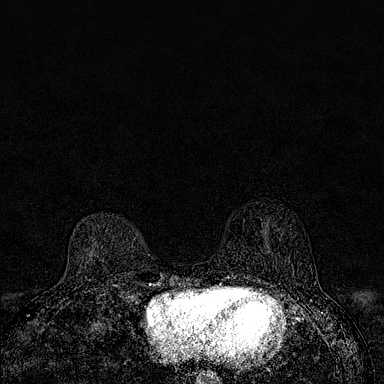
[im 72/144]
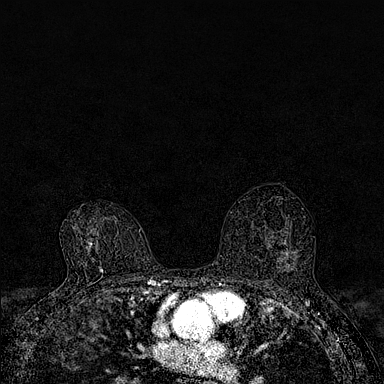
[im 108/144]
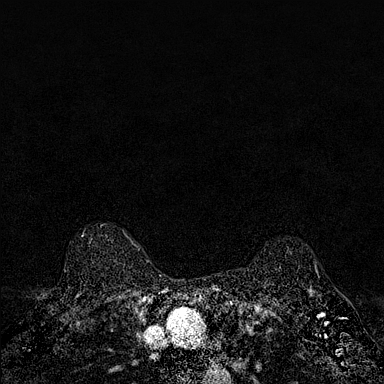
[im 144/144]
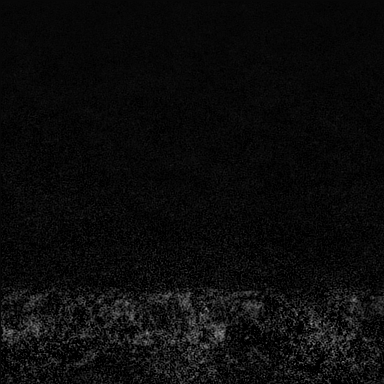

[Series 7: fl3d post-cm 20 · axial · 172.8mm · 0.89mm/px · 1 of 1 slices shown (3 of 3)]
[im 1/1]
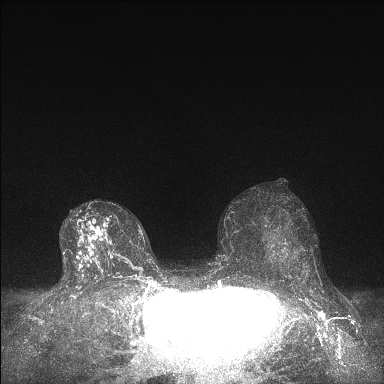

[Series 8: fl3d post-cm 3 · axial · 1.2mm · 0.89mm/px · z∈[-110,+61]mm · 6 of 144 slices shown (1 of 2)]
[im 1/144]
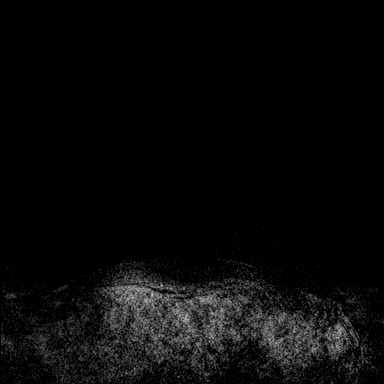
[im 29/144]
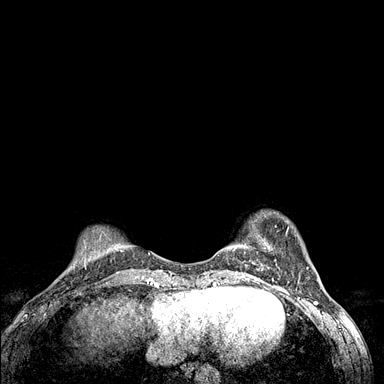
[im 58/144]
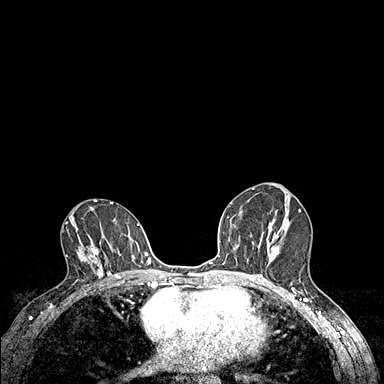
[im 86/144]
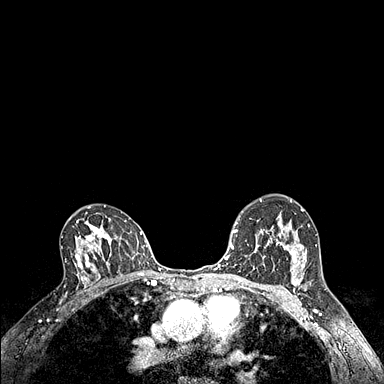
[im 115/144]
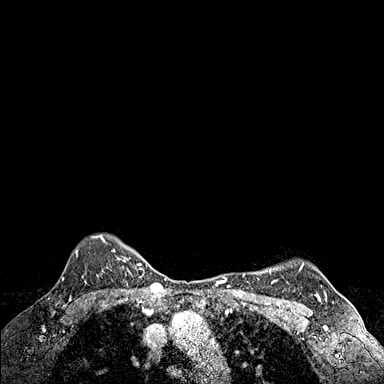
[im 144/144]
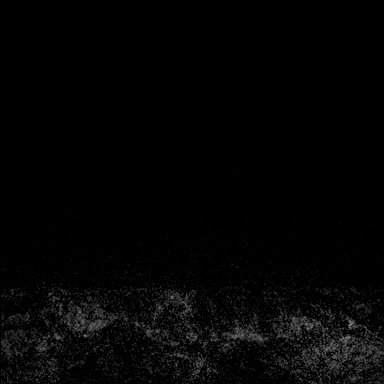

[Series 9: fl3d post-cm 3 · axial · 1.2mm · 0.89mm/px · z∈[-110,+26]mm · 5 of 144 slices shown (2 of 2)]
[im 1/144]
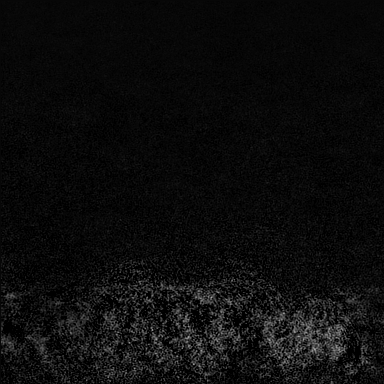
[im 29/144]
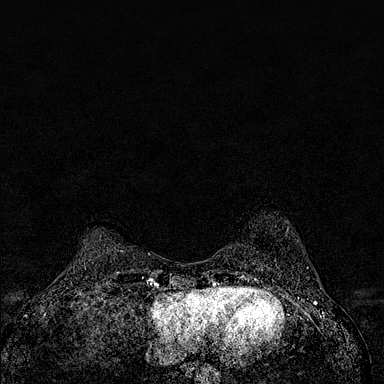
[im 58/144]
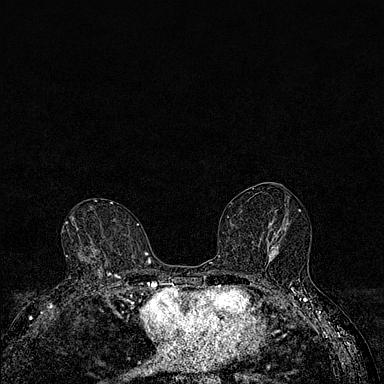
[im 86/144]
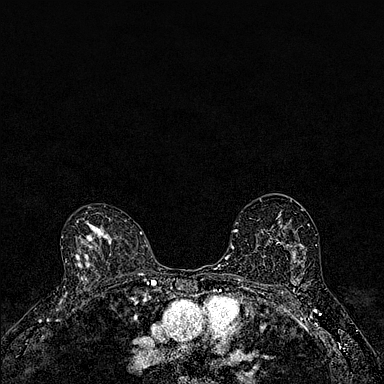
[im 115/144]
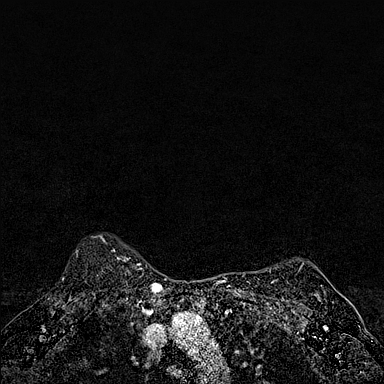

[33 of 48 positions shown; findings below may reference images not displayed]

Three-dimensional MR images were rendered by post-processing of the
original MR data on an independent workstation. The
three-dimensional MR images were interpreted, and findings are
reported in the following complete MRI report for this study. Three
dimensional images were evaluated at the independent interpreting
workstation using the DynaCAD thin client.
FINDINGS: Breast composition: c. Heterogeneous fibroglandular tissue.

Background parenchymal enhancement: Moderate.

Right breast: No mass or abnormal enhancement.

Left breast: No mass or abnormal enhancement.

Lymph nodes: No abnormal appearing lymph nodes.

Ancillary findings: There is a small nonenhancing T2 bright lesion
in the liver (series 2, image 50), most likely a cyst.
IMPRESSION: No MRI evidence of malignancy in either breast.

RECOMMENDATION:
1.  Continue routine annual screening mammography.

2. The American Cancer Society recommends annual MRI and mammography
in
patients with an estimated lifetime risk of developing breast cancer
greater than 20 - 25%, or who are known or suspected to be positive
for the breast cancer gene.

BI-RADS CATEGORY  1: Negative.

## 2021-11-04 MED ORDER — GADOBUTROL 1 MMOL/ML IV SOLN
7.0000 mL | Freq: Once | INTRAVENOUS | Status: AC | PRN
  Administered 2021-11-04: 7 mL via INTRAVENOUS

## 2021-11-05 ENCOUNTER — Encounter: Payer: Self-pay | Admitting: Internal Medicine

## 2021-11-07 ENCOUNTER — Encounter: Payer: Self-pay | Admitting: Internal Medicine

## 2021-11-07 DIAGNOSIS — K7689 Other specified diseases of liver: Secondary | ICD-10-CM | POA: Insufficient documentation

## 2021-11-07 DIAGNOSIS — Z803 Family history of malignant neoplasm of breast: Secondary | ICD-10-CM | POA: Insufficient documentation

## 2021-11-07 NOTE — Addendum Note (Signed)
Addended by: Quentin Ore on: 11/07/2021 02:39 PM   Modules accepted: Orders

## 2021-11-17 ENCOUNTER — Ambulatory Visit (INDEPENDENT_AMBULATORY_CARE_PROVIDER_SITE_OTHER): Payer: BC Managed Care – PPO

## 2021-11-17 ENCOUNTER — Other Ambulatory Visit: Payer: Self-pay

## 2021-11-17 ENCOUNTER — Other Ambulatory Visit: Payer: Self-pay | Admitting: Certified Nurse Midwife

## 2021-11-17 DIAGNOSIS — N83201 Unspecified ovarian cyst, right side: Secondary | ICD-10-CM

## 2021-11-18 ENCOUNTER — Telehealth: Payer: Self-pay | Admitting: Internal Medicine

## 2021-11-18 NOTE — Telephone Encounter (Signed)
Order for ultrasound abdomen in from 11/07/21. Please advise

## 2021-11-18 NOTE — Telephone Encounter (Signed)
Patient called to see if a referral for a ultra sound for her liver has been scheduled. Please call her.

## 2021-11-19 NOTE — Telephone Encounter (Signed)
Noted  

## 2021-11-24 ENCOUNTER — Telehealth: Payer: Self-pay | Admitting: Internal Medicine

## 2021-11-24 NOTE — Telephone Encounter (Signed)
Lft pt vm to call ofc to sch US. thanks ?

## 2021-11-24 NOTE — Telephone Encounter (Signed)
Pt calling to schedule an ultrasound.

## 2021-11-26 NOTE — Telephone Encounter (Signed)
Pt want to make sure the ultrasound was put in correctly/code because pt is showing that its going to cost her 761.00 and her insurance should cover the whole process ?

## 2021-11-27 ENCOUNTER — Ambulatory Visit
Admission: RE | Admit: 2021-11-27 | Discharge: 2021-11-27 | Disposition: A | Discharge status: Home / Self Care | Payer: BC Managed Care – PPO | Source: Ambulatory Visit | Attending: Internal Medicine | Admitting: Internal Medicine

## 2021-11-27 ENCOUNTER — Other Ambulatory Visit: Payer: Self-pay

## 2021-11-27 DIAGNOSIS — Z803 Family history of malignant neoplasm of breast: Secondary | ICD-10-CM

## 2021-11-27 DIAGNOSIS — K7689 Other specified diseases of liver: Secondary | ICD-10-CM | POA: Diagnosis not present

## 2021-11-27 IMAGING — US US ABDOMEN COMPLETE
1 series · 13 of 25 positions shown · non-contrast
Comparison: Breast MRI [DATE].

CLINICAL DATA: 50-year-old female with possible hepatic cyst
discovered on breast MRI.

EXAM:
ABDOMEN ULTRASOUND COMPLETE

[Series 1: us abdomen complete · 0.15mm/px · 13 of 116 slices shown]
[im 1/116]
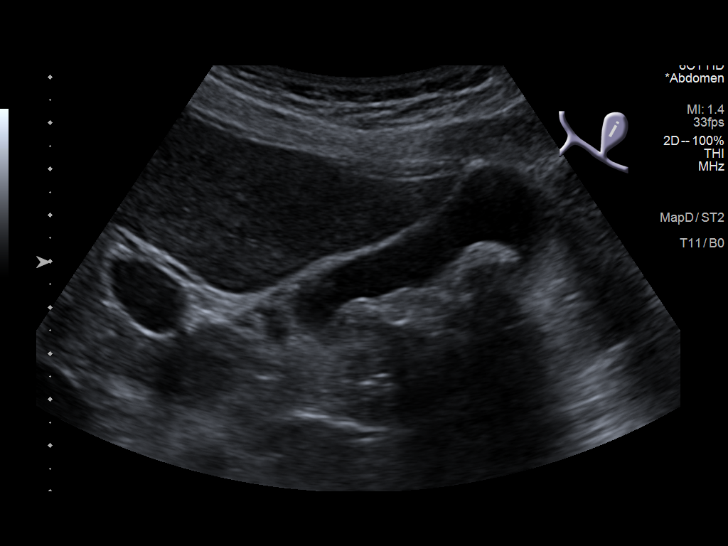
[im 10/116]
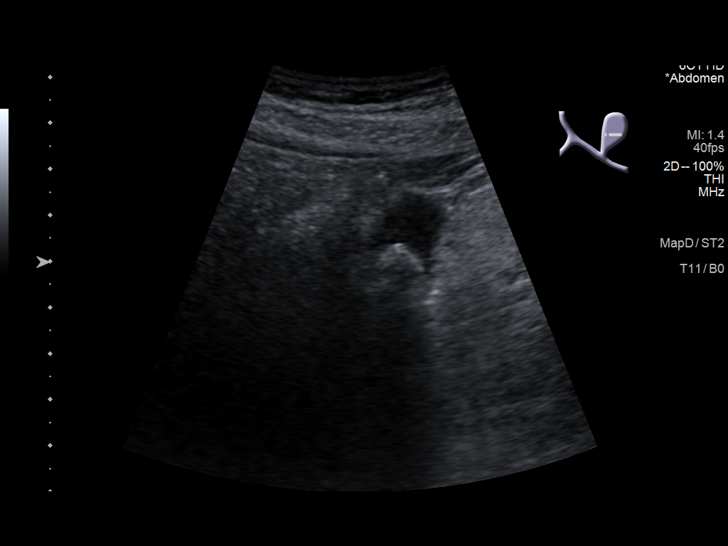
[im 20/116]
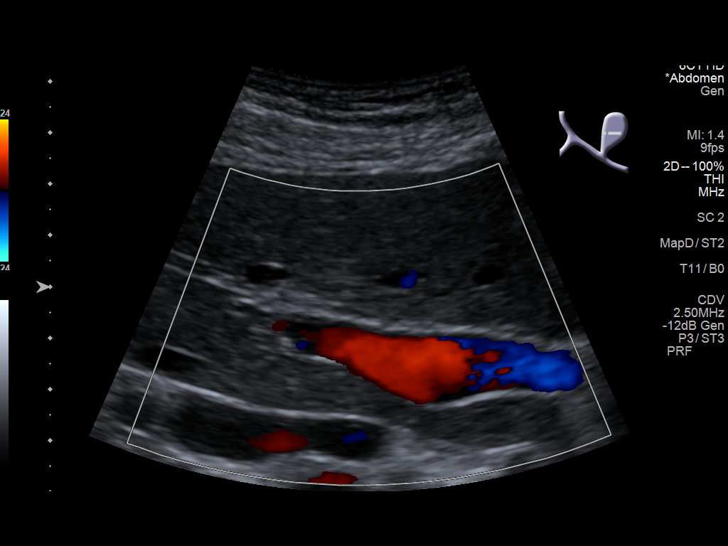
[im 29/116]
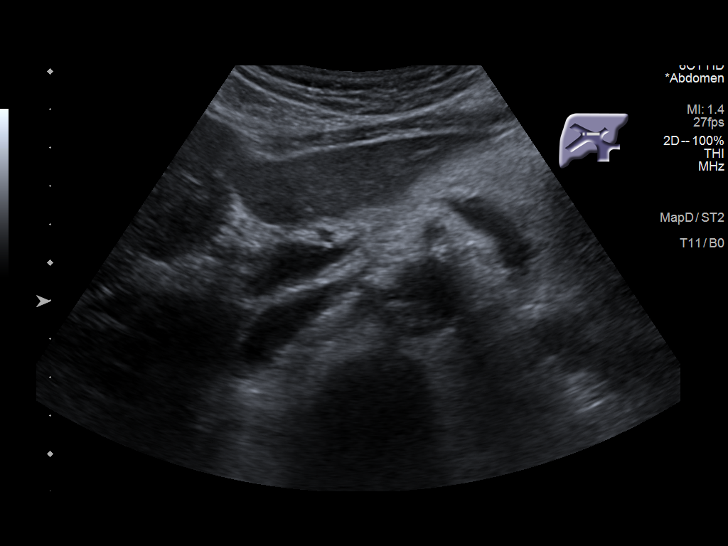
[im 39/116]
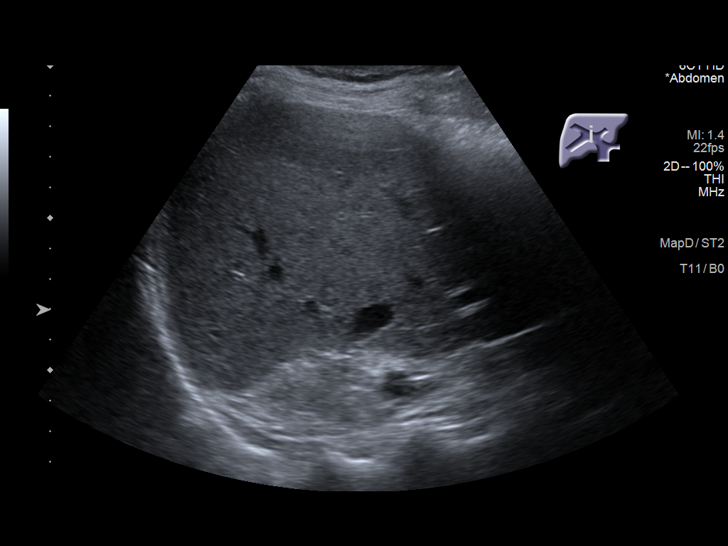
[im 48/116]
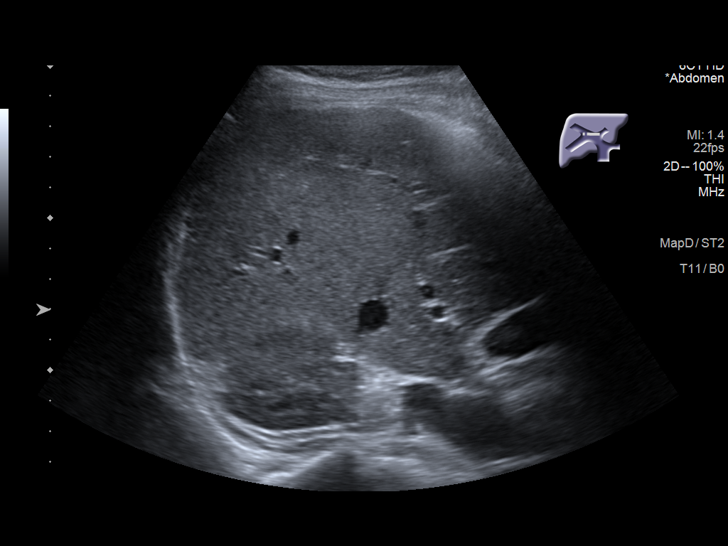
[im 58/116]
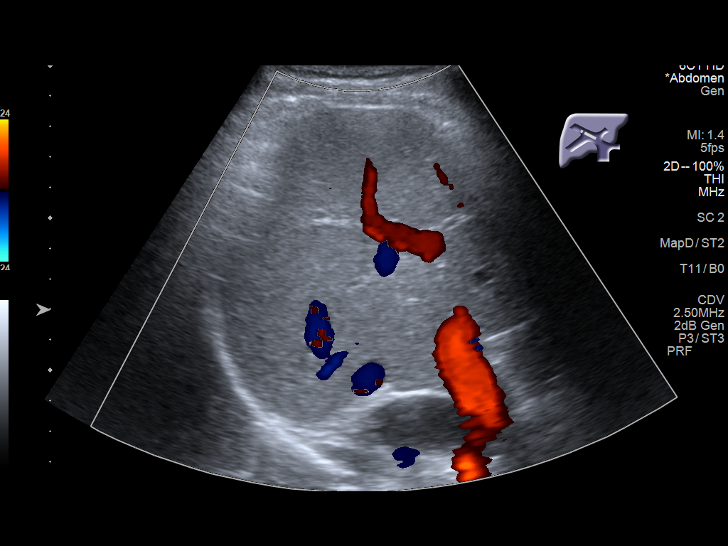
[im 68/116]
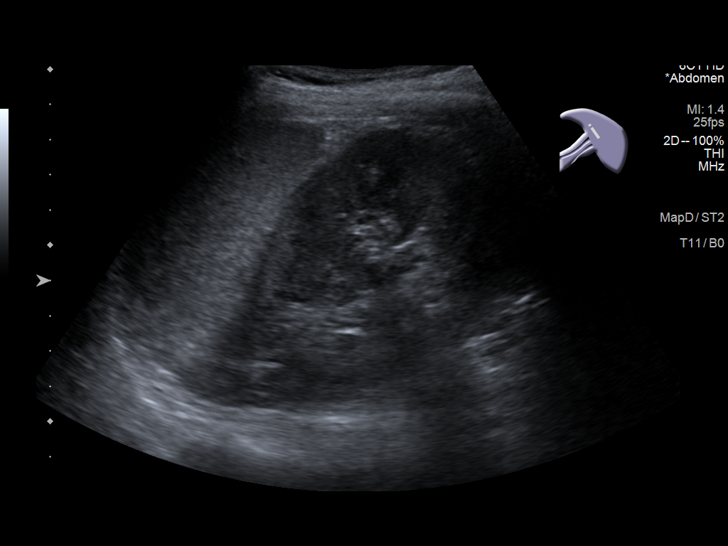
[im 77/116]
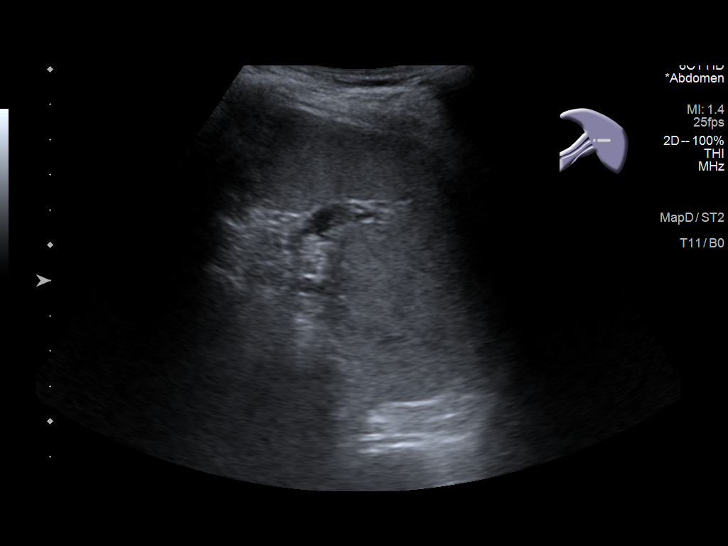
[im 87/116]
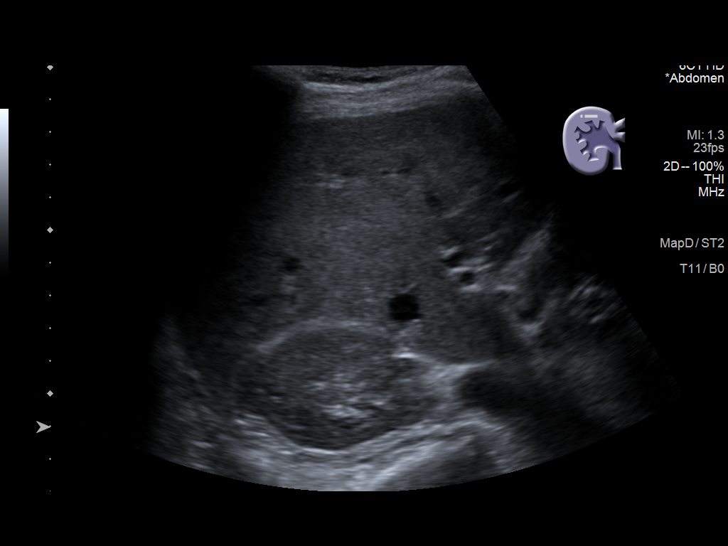
[im 96/116]
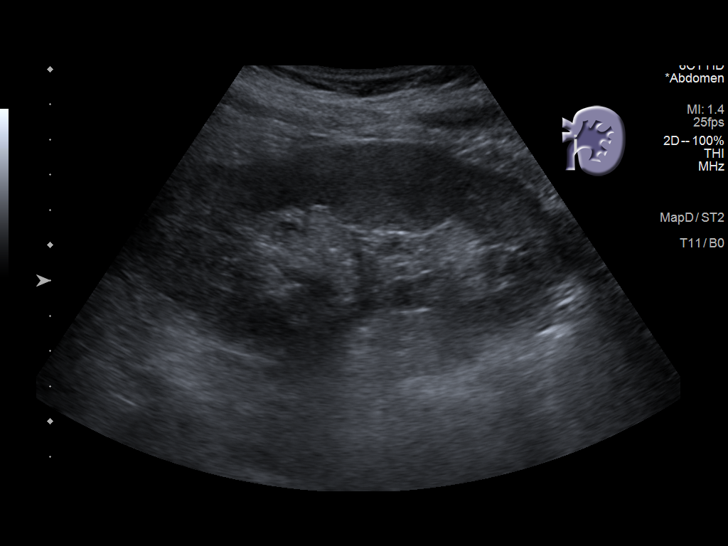
[im 106/116]
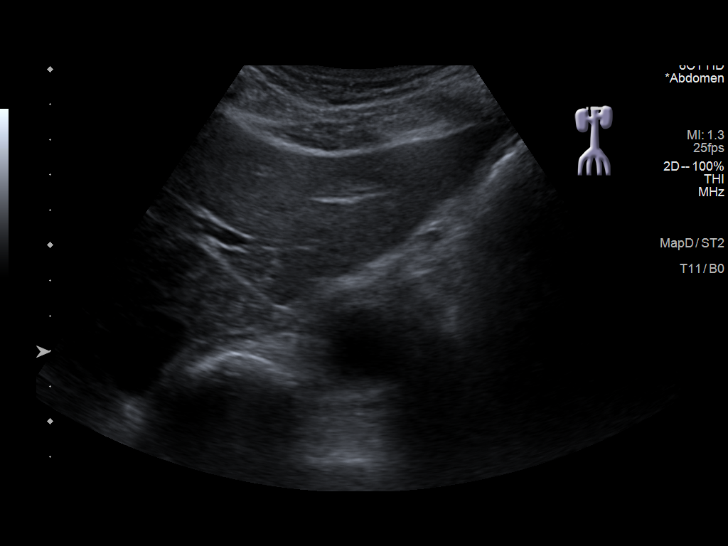
[im 116/116]
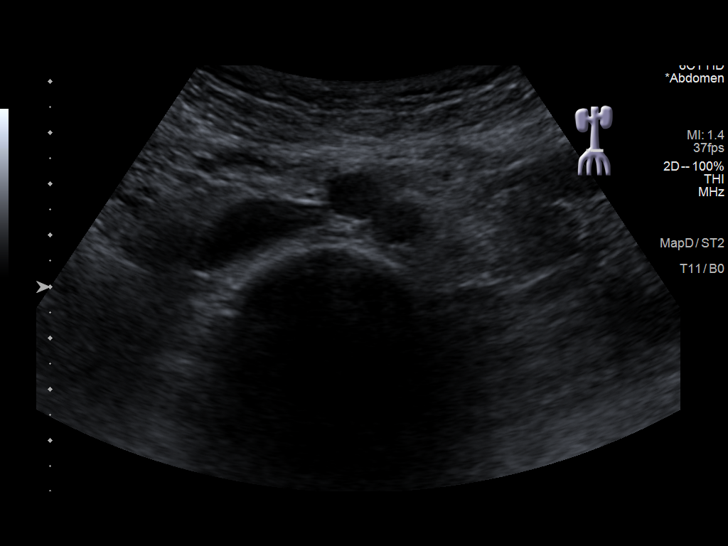

[13 of 25 positions shown; findings below may reference images not displayed]

FINDINGS: Gallbladder: Multiple shadowing echogenic gallstones (image 4)
individually up to 14 mm (image 5). Gallbladder wall thickness
remains normal. No pericholecystic fluid. No sonographic Murphy sign
elicited.

Common bile duct: Diameter: 3 mm, normal.

Liver: Small 12 mm subcapsular hyperechoic rounded lesion at the
liver dome appears to correspond to the MRI finding last month. See
image 56. No hypervascularity. Elsewhere liver echogenicity appears
normal. Portal vein is patent on color Doppler imaging with normal
direction of blood flow towards the liver.

IVC: No abnormality visualized.

Pancreas: Visualized portion unremarkable.

Spleen: Size and appearance within normal limits.

Right Kidney: Length: 11.1 cm. Echogenicity within normal limits. No
mass or hydronephrosis visualized.

Left Kidney: Length: 11.6 cm. Echogenicity within normal limits. No
mass or hydronephrosis visualized.

Abdominal aorta: No aneurysm visualized.

Other findings: None.
IMPRESSION: 1. A small benign 12 mm hemangioma at the liver dome corresponds to
the MRI finding last month.
2. Positive also for Cholelithiasis. But no evidence of acute
cholecystitis or bile duct obstruction.

## 2021-11-27 NOTE — Telephone Encounter (Signed)
Patient notified us was associated with Liver cyst and family HX of breast cancer. ?

## 2021-12-01 ENCOUNTER — Encounter: Payer: Self-pay | Admitting: Certified Nurse Midwife

## 2023-01-18 ENCOUNTER — Encounter: Payer: Self-pay | Admitting: Physician Assistant

## 2023-02-04 ENCOUNTER — Ambulatory Visit: Payer: BC Managed Care – PPO | Admitting: Nurse Practitioner

## 2023-02-04 ENCOUNTER — Encounter: Payer: Self-pay | Admitting: Nurse Practitioner

## 2023-02-04 VITALS — BP 102/70 | HR 61 | Temp 98.7°F | Ht 69.0 in | Wt 148.6 lb

## 2023-02-04 DIAGNOSIS — Z803 Family history of malignant neoplasm of breast: Secondary | ICD-10-CM

## 2023-02-04 DIAGNOSIS — Z23 Encounter for immunization: Secondary | ICD-10-CM

## 2023-02-04 DIAGNOSIS — E559 Vitamin D deficiency, unspecified: Secondary | ICD-10-CM

## 2023-02-04 DIAGNOSIS — Z1231 Encounter for screening mammogram for malignant neoplasm of breast: Secondary | ICD-10-CM

## 2023-02-04 DIAGNOSIS — N951 Menopausal and female climacteric states: Secondary | ICD-10-CM | POA: Diagnosis not present

## 2023-02-04 DIAGNOSIS — E785 Hyperlipidemia, unspecified: Secondary | ICD-10-CM

## 2023-02-04 DIAGNOSIS — Z0000 Encounter for general adult medical examination without abnormal findings: Secondary | ICD-10-CM

## 2023-02-04 DIAGNOSIS — E538 Deficiency of other specified B group vitamins: Secondary | ICD-10-CM | POA: Diagnosis not present

## 2023-02-04 DIAGNOSIS — Z1329 Encounter for screening for other suspected endocrine disorder: Secondary | ICD-10-CM | POA: Diagnosis not present

## 2023-02-04 DIAGNOSIS — Z9189 Other specified personal risk factors, not elsewhere classified: Secondary | ICD-10-CM

## 2023-02-04 DIAGNOSIS — D1803 Hemangioma of intra-abdominal structures: Secondary | ICD-10-CM

## 2023-02-04 DIAGNOSIS — Z1211 Encounter for screening for malignant neoplasm of colon: Secondary | ICD-10-CM

## 2023-02-04 MED ORDER — PAROXETINE HCL 10 MG PO TABS: 10.0000 mg | ORAL_TABLET | Freq: Every day | ORAL | 1 refills | Status: AC

## 2023-02-04 NOTE — Patient Instructions (Signed)
YOUR MAMMOGRAM IS DUE, PLEASE CALL AND GET THIS SCHEDULED! [facility] - call [phone]    

## 2023-02-04 NOTE — Progress Notes (Signed)
Bethanie Dicker, NP-C Phone: [phone]  Kaitlyn Kane is a 52 y.o. female who presents today for transfer of care and annual exam.   She reports having hot flashes throughout the day. They are worse at night. She has to wear layers of clothing during the day to prevent sweating through her clothes. She had a partial hysterectomy in 2008 and is wondering if she is going through menopause.   Diet: Well rounded- increased protein, limits dairy and avoids gluten Exercise: Walking 3 days per week, trying to increase to daily Pap smear: Hysterectomy in 2008 Colonoscopy: 10/15/2020- 10 year recall Mammogram: 06/26/2021 and MRI 11/04/2021 - Due! Family history-  Colon cancer: No  Breast cancer: Yes, mother  Ovarian cancer: No Menses: Hysterectomy Sexually active: Yes Vaccines-   Flu: Not due  Tetanus: 06/01/2018  Shingles: Due!  COVID19: x 3 HIV screening: Deferred Hep C Screening: Deferred Tobacco use: No Alcohol use: No Illicit Drug use: No Dentist: Yes Ophthalmology: Yes  HYPERLIPIDEMIA Symptoms Chest pain on exertion:  No   Leg claudication:   No Medications: Compliance- Diet controlled Right upper quadrant pain- No  Muscle aches- No Lipid Panel     Component Value Date/Time   CHOL 245 (H) 02/04/2023 1452   TRIG 61.0 02/04/2023 1452   HDL 80.80 02/04/2023 1452   CHOLHDL 3 02/04/2023 1452   VLDL 12.2 02/04/2023 1452   LDLCALC 152 (H) 02/04/2023 1452    Social History   Tobacco Use  Smoking Status Never  Smokeless Tobacco Never    Current Outpatient Medications on File Prior to Visit  Medication Sig Dispense Refill   cholecalciferol (VITAMIN D3) 25 MCG (1000 UNIT) tablet Take 1,000 Units by mouth daily.     Multiple Vitamins-Minerals (MULTIVITAMIN WOMENS 50+ ADV PO) Take by mouth.     Probiotic Product (PROBIOTIC PO) Take by mouth.     No current facility-administered medications on file prior to visit.    ROS see history of present  illness  Objective  Physical Exam Vitals:   02/04/23 1408  BP: 102/70  Pulse: 61  Temp: 98.7 F (37.1 C)  SpO2: 98%    BP Readings from Last 3 Encounters:  02/04/23 102/70  09/09/21 118/82  06/09/21 136/70   Wt Readings from Last 3 Encounters:  02/04/23 148 lb 9.6 oz (67.4 kg)  09/09/21 154 lb 9.6 oz (70.1 kg)  06/09/21 152 lb (68.9 kg)    Physical Exam Constitutional:      General: She is not in acute distress.    Appearance: Normal appearance.  HENT:     Head: Normocephalic.     Right Ear: Tympanic membrane normal.     Left Ear: Tympanic membrane normal.     Nose: Nose normal.     Mouth/Throat:     Mouth: Mucous membranes are moist.     Pharynx: Oropharynx is clear.  Eyes:     Conjunctiva/sclera: Conjunctivae normal.     Pupils: Pupils are equal, round, and reactive to light.  Neck:     Thyroid: No thyromegaly.  Cardiovascular:     Rate and Rhythm: Normal rate and regular rhythm.     Heart sounds: Normal heart sounds.  Pulmonary:     Effort: Pulmonary effort is normal.     Breath sounds: Normal breath sounds.  Abdominal:     General: Abdomen is flat. Bowel sounds are normal.     Palpations: Abdomen is soft. There is no mass.     Tenderness: There is  no abdominal tenderness.  Musculoskeletal:        General: Normal range of motion.  Lymphadenopathy:     Cervical: No cervical adenopathy.  Skin:    General: Skin is warm and dry.     Findings: No rash.  Neurological:     General: No focal deficit present.     Mental Status: She is alert.  Psychiatric:        Mood and Affect: Mood normal.        Behavior: Behavior normal.    Assessment/Plan: Please see individual problem list.  Preventative health care Assessment & Plan: Physical exam complete. Lab work as outlined. Will contact patient with results. Pap- Hysterectomy. Colonoscopy- UTD. Mammogram and Breast MRI- due! Orders placed, encouraged patient to call to schedule. Flu vaccine not due.  Declined additional COVID vaccines. Tetanus vaccine- UTD. Shingles vaccine- first dose given today in office. She will return in 2-6 months for the second dose. HIV/Hep C screenings deferred. Recommended follow ups with Dentist and Ophthalmology for annual exams. Encouraged to continue healthy diet and exercise. Return to care in 6 months, sooner PRN.   Orders: -     CBC with Differential/Platelet -     Comprehensive metabolic panel  Menopausal syndrome (hot flushes) Assessment & Plan: Will check FSH today. Long discussion with patient regarding the cease of estrogen production in menopause, likely causing her symptoms. Discussed that due to her increased risk for breast cancer, recommend avoiding hormone replacement therapy. Advised starting on Paxil. Patient agreeable. Will start on 10 mg daily. Counseled patient on common side effects. Encouraged to contact if worsening symptoms, unusual behavior changes or suicidal thoughts occur. Will continue to monitor.   Orders: -     Follicle stimulating hormone -     PARoxetine HCl; Take 1 tablet (10 mg total) by mouth daily.  Dispense: 90 tablet; Refill: 1  Hyperlipidemia, unspecified hyperlipidemia type Assessment & Plan: Chronic. Stable with diet control. The 10-year ASCVD risk score (Arnett DK, et al., 2019) is: 0.9%. Will check lipid panel today. Encouraged to continue healthy diet and exercise.   Orders: -     Lipid panel  B12 deficiency Assessment & Plan: Chronic. Not taking a supplement or getting injections. Will check B12 level today.   Orders: -     Vitamin B12  Vitamin D deficiency Assessment & Plan: Chronic. Taking OTC daily supplement. Continue. Will check vitamin D level today.   Orders: -     VITAMIN D 25 Hydroxy (Vit-D Deficiency, Fractures)  Screening mammogram for breast cancer -     3D Screening Mammogram, Left and Right; Future  FH: breast cancer in first degree relative -     MR BREAST BILATERAL W WO CONTRAST INC  CAD; Future  At increased risk of breast cancer -     MR BREAST BILATERAL W WO CONTRAST INC CAD; Future  Thyroid disorder screen -     TSH  Need for shingles vaccine -     Varicella-zoster vaccine IM   Return in about 6 months (around 08/07/2023) for Follow up.   Bethanie Dicker, NP-C [facility] - [employer]

## 2023-02-05 LAB — COMPREHENSIVE METABOLIC PANEL
ALT: 15 U/L (ref 0–35)
AST: 18 U/L (ref 0–37)
Albumin: 4.8 g/dL (ref 3.5–5.2)
Alkaline Phosphatase: 50 U/L (ref 39–117)
BUN: 14 mg/dL (ref 6–23)
CO2: 30 mEq/L (ref 19–32)
Calcium: 10 mg/dL (ref 8.4–10.5)
Chloride: 99 mEq/L (ref 96–112)
Creatinine, Ser: 0.7 mg/dL (ref 0.40–1.20)
GFR: 98.57 mL/min (ref >60.00)
Glucose, Bld: 79 mg/dL (ref 70–99)
Potassium: 3.9 mEq/L (ref 3.5–5.1)
Sodium: 138 mEq/L (ref 135–145)
Total Bilirubin: 0.6 mg/dL (ref 0.2–1.2)
Total Protein: 7.3 g/dL (ref 6.0–8.3)

## 2023-02-05 LAB — LIPID PANEL
Cholesterol: 245 mg/dL — ABNORMAL HIGH (ref 0–200)
HDL: 80.8 mg/dL (ref >39.00)
LDL Cholesterol: 152 mg/dL — ABNORMAL HIGH (ref 0–99)
NonHDL: 164.11
Total CHOL/HDL Ratio: 3
Triglycerides: 61 mg/dL (ref 0.0–149.0)
VLDL: 12.2 mg/dL (ref 0.0–40.0)

## 2023-02-05 LAB — CBC WITH DIFFERENTIAL/PLATELET
Basophils Absolute: 0 10*3/uL (ref 0.0–0.1)
Basophils Relative: 1 % (ref 0.0–3.0)
Eosinophils Absolute: 0 10*3/uL (ref 0.0–0.7)
Eosinophils Relative: 0.7 % (ref 0.0–5.0)
HCT: 39.7 % (ref 36.0–46.0)
Hemoglobin: 13.5 g/dL (ref 12.0–15.0)
Lymphocytes Relative: 33.7 % (ref 12.0–46.0)
Lymphs Abs: 1.6 10*3/uL (ref 0.7–4.0)
MCHC: 33.9 g/dL (ref 30.0–36.0)
MCV: 93.7 fl (ref 78.0–100.0)
Monocytes Absolute: 0.4 10*3/uL (ref 0.1–1.0)
Monocytes Relative: 7.5 % (ref 3.0–12.0)
Neutro Abs: 2.8 10*3/uL (ref 1.4–7.7)
Neutrophils Relative %: 57.1 % (ref 43.0–77.0)
Platelets: 366 10*3/uL (ref 150.0–400.0)
RBC: 4.24 Mil/uL (ref 3.87–5.11)
RDW: 12.6 % (ref 11.5–15.5)
WBC: 4.9 10*3/uL (ref 4.0–10.5)

## 2023-02-05 LAB — TSH: TSH: 0.69 u[IU]/mL (ref 0.35–5.50)

## 2023-02-05 LAB — VITAMIN D 25 HYDROXY (VIT D DEFICIENCY, FRACTURES): VITD: 60.92 ng/mL (ref 30.00–100.00)

## 2023-02-05 LAB — FOLLICLE STIMULATING HORMONE: FSH: 112.6 m[IU]/mL

## 2023-02-05 LAB — VITAMIN B12: Vitamin B-12: 645 pg/mL (ref 211–911)

## 2023-02-09 ENCOUNTER — Encounter: Payer: Self-pay | Admitting: Nurse Practitioner

## 2023-02-09 NOTE — Assessment & Plan Note (Signed)
Will check FSH today. Long discussion with patient regarding the cease of estrogen production in menopause, likely causing her symptoms. Discussed that due to her increased risk for breast cancer, recommend avoiding hormone replacement therapy. Advised starting on Paxil. Patient agreeable. Will start on 10 mg daily. Counseled patient on common side effects. Encouraged to contact if worsening symptoms, unusual behavior changes or suicidal thoughts occur. Will continue to monitor.

## 2023-02-09 NOTE — Assessment & Plan Note (Signed)
Chronic. Not taking a supplement or getting injections. Will check B12 level today.

## 2023-02-09 NOTE — Assessment & Plan Note (Addendum)
Physical exam complete. Lab work as outlined. Will contact patient with results. Pap- Hysterectomy. Colonoscopy- UTD. Mammogram and Breast MRI- due! Orders placed, encouraged patient to call to schedule. Flu vaccine not due. Declined additional COVID vaccines. Tetanus vaccine- UTD. Shingles vaccine- first dose given today in office. She will return in 2-6 months for the second dose. HIV/Hep C screenings deferred. Recommended follow ups with Dentist and Ophthalmology for annual exams. Encouraged to continue healthy diet and exercise. Return to care in 6 months, sooner PRN.

## 2023-02-09 NOTE — Assessment & Plan Note (Signed)
Chronic. Taking OTC daily supplement. Continue. Will check vitamin D level today.  

## 2023-02-09 NOTE — Assessment & Plan Note (Addendum)
Chronic. Stable with diet control. The 10-year ASCVD risk score (Arnett DK, et al., 2019) is: 0.9%. Will check lipid panel today. Encouraged to continue healthy diet and exercise.

## 2023-02-24 ENCOUNTER — Ambulatory Visit
Admission: RE | Admit: 2023-02-24 | Discharge: 2023-02-24 | Disposition: A | Discharge status: Home / Self Care | Payer: BC Managed Care – PPO | Source: Ambulatory Visit | Attending: Nurse Practitioner | Admitting: Nurse Practitioner

## 2023-02-24 DIAGNOSIS — Z1231 Encounter for screening mammogram for malignant neoplasm of breast: Secondary | ICD-10-CM | POA: Diagnosis present

## 2023-05-26 ENCOUNTER — Other Ambulatory Visit: Payer: Self-pay | Admitting: Medical Genetics

## 2023-05-26 DIAGNOSIS — Z006 Encounter for examination for normal comparison and control in clinical research program: Secondary | ICD-10-CM

## 2023-07-27 ENCOUNTER — Encounter: Payer: Self-pay | Admitting: Nurse Practitioner

## 2023-08-10 ENCOUNTER — Ambulatory Visit: Payer: BC Managed Care – PPO | Admitting: Nurse Practitioner

## 2023-08-13 ENCOUNTER — Ambulatory Visit
Admission: RE | Admit: 2023-08-13 | Discharge: 2023-08-13 | Disposition: A | Discharge status: Home / Self Care | Payer: BC Managed Care – PPO | Source: Ambulatory Visit | Attending: Nurse Practitioner

## 2023-08-13 DIAGNOSIS — Z9189 Other specified personal risk factors, not elsewhere classified: Secondary | ICD-10-CM

## 2023-08-13 DIAGNOSIS — Z803 Family history of malignant neoplasm of breast: Secondary | ICD-10-CM

## 2023-08-13 MED ORDER — GADOPICLENOL 0.5 MMOL/ML IV SOLN
7.0000 mL | Freq: Once | INTRAVENOUS | Status: AC | PRN
  Administered 2023-08-13: 7 mL via INTRAVENOUS

## 2023-08-14 ENCOUNTER — Other Ambulatory Visit
Admission: RE | Admit: 2023-08-14 | Discharge: 2023-08-14 | Disposition: A | Discharge status: Home / Self Care | Payer: BC Managed Care – PPO | Source: Ambulatory Visit | Attending: Medical Genetics | Admitting: Medical Genetics

## 2023-08-14 DIAGNOSIS — Z006 Encounter for examination for normal comparison and control in clinical research program: Secondary | ICD-10-CM

## 2023-08-29 LAB — GENECONNECT MOLECULAR SCREEN: Genetic Analysis Overall Interpretation: NEGATIVE

## 2024-02-28 ENCOUNTER — Other Ambulatory Visit: Payer: Self-pay

## 2024-02-28 DIAGNOSIS — N951 Menopausal and female climacteric states: Secondary | ICD-10-CM

## 2024-03-02 ENCOUNTER — Ambulatory Visit: Payer: Self-pay | Admitting: Nurse Practitioner

## 2024-03-15 LAB — FOLLICLE STIMULATING HORMONE: FSH: 82.4 m[IU]/mL

## 2024-03-15 LAB — TESTOSTERONE,FREE AND TOTAL
Testosterone, Free: 6.1 pg/mL — ABNORMAL HIGH (ref 0.0–4.2)
Testosterone: 375 ng/dL — ABNORMAL HIGH (ref 4–50)

## 2024-03-15 LAB — HORMONE PANEL (T4,TSH,FSH,TESTT,SHBG,DHEA,ETC)
DHEA-Sulfate, LCMS: 94 ug/dL
Estradiol, Serum, MS: 62 pg/mL
Follicle Stimulating Hormone: 91 m[IU]/mL
Free T-3: 2.6 pg/mL
Free Testosterone, Serum: 46 pg/mL — ABNORMAL HIGH
Progesterone, Serum: 52 ng/dL
Sex Hormone Binding Globulin: 136 nmol/L — ABNORMAL HIGH
T4: 7.3 ug/dL
TSH: 0.9 uU/mL
Testosterone, Serum (Total): 651 ng/dL — ABNORMAL HIGH
Testosterone-% Free: 0.7 %
Triiodothyronine (T-3), Serum: 90 ng/dL

## 2024-03-15 LAB — THYROID PEROXIDASE ANTIBODY: Thyroperoxidase Ab SerPl-aCnc: <9 [IU]/mL (ref 0–34)

## 2024-03-15 LAB — TSH: TSH: 0.913 u[IU]/mL (ref 0.450–4.500)

## 2024-03-15 LAB — ESTRADIOL: Estradiol: 58.9 pg/mL

## 2024-03-15 LAB — DHEA-SULFATE: DHEA-SO4: 151 ug/dL (ref 41.2–243.7)

## 2024-03-15 LAB — T4, FREE: Free T4: 1.07 ng/dL (ref 0.82–1.77)

## 2024-03-15 LAB — T3, FREE: T3, Free: 2.7 pg/mL (ref 2.0–4.4)

## 2024-06-26 ENCOUNTER — Ambulatory Visit: Payer: Self-pay

## 2024-06-26 NOTE — Telephone Encounter (Signed)
 Patient goes to [facility] She is going to call that OB/GYN and see about getting an appointment and she will us  back if she needs anything further  FYI Only or Action Required?: FYI only for provider.  Patient was last seen in primary care on 02/04/2023 by Gretel App, NP.  Called Nurse Triage reporting Vaginal Bleeding.  Symptoms began one episode three days ago and still having dull aching of lower abdominopelvic area and lower back.  Interventions attempted: OTC medications: ibuprofen.  Symptoms are: unchanged.  Triage Disposition: See PCP Within 2 Weeks  Patient/caregiver understands and will follow disposition?: No, wishes to speak with PCP---Patient going to call their OB/GYN to see about getting an appointment there and call us  back if needed      Copied from CRM #[id number]. Topic: Clinical - Red Word Triage >> Jun 26, 2024  9:36 AM Montie POUR wrote: Red Word that prompted transfer to Nurse Triage:  She wants to get a referral to a OBGYN doctor. She has been bleeding like she is on her period. She has had her uterus removed. She is worried that something is wrong. Reason for Disposition  Bleeding or spotting occurs after hysterectomy  Answer Assessment - Initial Assessment Questions Hysterectomy 20 years ago Found a brown possible clot in her underwear a few days ago Some lower abd ache and lower back ache  1. SYMPTOM: What's the main symptom you're concerned about? (e.g., pain, itching, dryness)     Found some brownish blood 3 days ago 3. ONSET: When did the  bleeding  start?     3 days ago 4. PAIN: Is there any pain? If Yes, ask: How bad is it? (Scale: 1-10; mild, moderate, severe)     Dull ache 5. ITCHING: Is there any itching? If Yes, ask: How bad is it? (Scale: 1-10; mild, moderate, severe)     no 6. CAUSE: What do you think is causing the discharge? Have you had the same problem before? What happened then?     brownish 7. OTHER SYMPTOMS:  Do you have any other symptoms? (e.g., fever, itching, vaginal bleeding, pain with urination, injury to genital area, vaginal foreign body)     Lower abd/pelvic ache and lower back ache, some nausea 8. PREGNANCY: Is there any chance you are pregnant? When was your last menstrual period?     Hysterectomy 20 years  Answer Assessment - Initial Assessment Questions Hysterectomy 20 years ago Found a brownish discharge, possible clot in her underwear a few days ago Some lower abdominopelvic ache and lower back ache No discharge or clots seen since Friday (3 days ago) and only seen once Patient is going to call her OB/GYN first and see if she can get in there for an appointment and if she needs to she will call us  back Patient is advised that if anything worsens to go to the Emergency Room. Patient verbalized understanding.   1. SYMPTOM: What's the main symptom you're concerned about? (e.g., pain, itching, dryness)     Found some brownish blood 3 days ago 3. ONSET: When did the  bleeding  start?     3 days ago 4. PAIN: Is there any pain? If Yes, ask: How bad is it? (Scale: 1-10; mild, moderate, severe)     Dull ache 5. ITCHING: Is there any itching? If Yes, ask: How bad is it? (Scale: 1-10; mild, moderate, severe)     no 6. CAUSE: What do you think is causing the discharge? Have  you had the same problem before? What happened then?     brownish 7. OTHER SYMPTOMS: Do you have any other symptoms? (e.g., fever, itching, vaginal bleeding, pain with urination, injury to genital area, vaginal foreign body)     Lower abd/pelvic ache and lower back ache, some nausea 8. PREGNANCY: Is there any chance you are pregnant? When was your last menstrual period?     Hysterectomy 20 years  Protocols used: Vaginal Symptoms-A-AH, Vaginal Bleeding - Abnormal-A-AH

## 2024-06-30 NOTE — Progress Notes (Unsigned)
 GYNECOLOGY PROGRESS NOTE: NEW PATIENT  Subjective:  PCP: Gretel App, NP  Patient ID: Kaitlyn Kane, female    DOB: [birth year], 53 y.o.   MRN: [id number]  HPI  Patient is a 53 y.o. [phone] female who was referred by PCP App Gretel NP for a brown blood clot in her underwear last week.  No bleeding.  She is s/p a partial hysterectomy (still has tubes and ovaries)  in July 22, 2007 due to AUB/adenomyosis and concomitant CIN2.  She had a pap last in 2018-07-21,  NILM/HPV-, despite not having a cervix.   GYN Hx: LMP: 07-22-07 s/p hysterectomy Last pap: 06/01/18, NILM, HPV-; s/p hysterectomy 2007-07-22 Abnormal pap: prior to July 22, 2007 and hysterectomy STIHx: denies  OB History     Gravida  4   Para  3   Term  3   Preterm      AB  1   Living  3      SAB  1   IAB      Ectopic      Multiple      Live Births             Past Medical History:  Diagnosis Date   Arthritis    COVID-19    07/21/20   Vitamin D  deficiency    Patient Active Problem List   Diagnosis Date Noted   Liver hemangioma 02/04/2023   Menopausal syndrome (hot flushes) 02/04/2023   Preventative health care 02/04/2023   FH: breast cancer in first degree relative 11/07/2021   Abdominal bloating 06/09/2021   Fibrocystic breast changes 12/27/2020   Hip dysplasia 12/27/2020   B12 deficiency 04/01/2020   Grief reaction 09/08/2019   Lumbar spondylosis 07/11/2018   Osteoarthritis of both hips resulting from hip dysplasia 07/11/2018   Hyperlipidemia 06/01/2018   Vitamin D  deficiency 06/01/2018   Arthritis 03/01/2018   Pain of both hip joints 03/01/2018   Hip pain 01/14/2012   Past Surgical History:  Procedure Laterality Date   ABDOMINAL HYSTERECTOMY     22-Jul-2007 HPV HSIL/CIN 2 adenomyosis cervix and uterus out still has fallopian tubes and ovaries    BREAST BIOPSY Right 05/13/2020   MRI biopsy x 2 areas, neg   BREAST BIOPSY Left 05/13/2020   MRI biopsy, neg   left total hip     arthroplasty Dr. Ernie 12/22/21    SHOULDER SURGERY     right capsular plication 2009-07-21    Family History  Problem Relation Age of Onset   Hyperlipidemia Mother    Hypertension Mother    Breast cancer Mother    Other Mother        covid 36 died due to this 07-21-2020   Arthritis Father    Cancer Father        neuroendocrine tumor small intestine to liver met   COPD Father    Hearing loss Father    Depression Sister    Diabetes Sister    Seizures Sister    Depression Brother    Drug abuse Brother    Stroke Maternal Grandmother    Cancer Maternal Grandmother        testicular    Early death Paternal Grandfather    Social History   Socioeconomic History   Marital status: Married    Spouse name: Not on file   Number of children: Not on file   Years of education: Not on file   Highest education level: Not on file  Occupational History   Not  on file  Tobacco Use   Smoking status: Never   Smokeless tobacco: Never  Substance and Sexual Activity   Alcohol use: Yes   Drug use: Not Currently   Sexual activity: Yes  Other Topics Concern   Not on file  Social History Narrative   3 kids   Married    Owns guns, wears seat belt, safe in relationship   [employer] Degree client Production designer, theatre/television/film    Social Drivers of Corporate investment banker Strain: Not on file  Food Insecurity: Not on file  Transportation Needs: Not on file  Physical Activity: Not on file  Stress: Not on file  Social Connections: Not on file  Intimate Partner Violence: Not on file   Current Outpatient Medications on File Prior to Visit  Medication Sig Dispense Refill   cholecalciferol (VITAMIN D3) 25 MCG (1000 UNIT) tablet Take 1,000 Units by mouth daily.     estradiol  (ESTRACE ) 1 MG tablet Take 1 mg by mouth daily.     progesterone (PROMETRIUM) 200 MG capsule Take 200 mg by mouth at bedtime.     Testosterone  10 MG/ACT (2%) GEL Place onto the skin.     Multiple Vitamins-Minerals (MULTIVITAMIN WOMENS 50+ ADV PO) Take by mouth. (Patient not taking: Reported  on 07/04/2024)     PARoxetine  (PAXIL ) 10 MG tablet Take 1 tablet (10 mg total) by mouth daily. (Patient not taking: Reported on 07/04/2024) 90 tablet 1   Probiotic Product (PROBIOTIC PO) Take by mouth. (Patient not taking: Reported on 07/04/2024)     No current facility-administered medications on file prior to visit.   No Known Allergies  Review of Systems Pertinent items are noted in HPI.   Objective:   Blood pressure 127/73, pulse 60, weight 144 lb 3.2 oz (65.4 kg). Body mass index is 21.29 kg/m.  General appearance: alert, cooperative, and no distress Abdomen: soft, non-tender; bowel sounds normal; no masses,  no organomegaly Pelvic: external genitalia normal, no adnexal masses or tenderness, perianal skin: no external genital warts noted, rectovaginal septum normal, urethra without abnormality or discharge, uterus surgically absent, and vagina normal without discharge; cuff well-healed; vagina without atrophic findings; no visible lesion, ulcer, or prominent vasculature Extremities: extremities normal, atraumatic, no cyanosis or edema Neurologic: Grossly normal   Assessment/Plan:   1. Vaginal bleeding    53 y.o. [id number] s/p hysterectomy in 2008 and without cervix, with first episode of one-time brown blood clot from vagina. Exam normal, without findings that would be source of the blood clot. Reassurance given, nothing further to do at this time. Discussed symptoms of atrophy, which she is not experiencing at this time, and tissues were normal-appearing today. If develops, return to clinic. Discussed not needing pap tests any further, but should have a pelvic exam at least every other year if asymptomatic, and sooner if warranted. Does health maintenance with PCP, follow up with us  prn.    Estil Mangle, DO [facility]

## 2024-07-03 ENCOUNTER — Encounter: Payer: Self-pay | Admitting: Obstetrics

## 2024-07-04 ENCOUNTER — Encounter: Payer: Self-pay | Admitting: Obstetrics

## 2024-07-04 ENCOUNTER — Ambulatory Visit: Admitting: Obstetrics

## 2024-07-04 VITALS — BP 127/73 | HR 60 | Wt 144.2 lb

## 2024-07-04 DIAGNOSIS — N950 Postmenopausal bleeding: Secondary | ICD-10-CM

## 2024-07-04 DIAGNOSIS — N939 Abnormal uterine and vaginal bleeding, unspecified: Secondary | ICD-10-CM | POA: Diagnosis not present

## 2024-08-23 ENCOUNTER — Encounter: Payer: Self-pay | Admitting: Internal Medicine

## 2024-08-23 ENCOUNTER — Ambulatory Visit: Admitting: Internal Medicine

## 2024-08-23 ENCOUNTER — Ambulatory Visit
Admission: RE | Admit: 2024-08-23 | Discharge: 2024-08-23 | Disposition: A | Source: Ambulatory Visit | Attending: Internal Medicine

## 2024-08-23 VITALS — BP 122/78 | HR 67 | Temp 98.0°F | Ht 69.0 in | Wt 148.2 lb

## 2024-08-23 DIAGNOSIS — H6690 Otitis media, unspecified, unspecified ear: Secondary | ICD-10-CM | POA: Insufficient documentation

## 2024-08-23 DIAGNOSIS — R42 Dizziness and giddiness: Secondary | ICD-10-CM | POA: Insufficient documentation

## 2024-08-23 DIAGNOSIS — R519 Headache, unspecified: Secondary | ICD-10-CM

## 2024-08-23 LAB — CBC WITH DIFFERENTIAL/PLATELET
Basophils Absolute: 0 K/uL (ref 0.0–0.1)
Basophils Relative: 0.8 % (ref 0.0–3.0)
Eosinophils Absolute: 0 K/uL (ref 0.0–0.7)
Eosinophils Relative: 0.9 % (ref 0.0–5.0)
HCT: 40.9 % (ref 36.0–46.0)
Hemoglobin: 13.9 g/dL (ref 12.0–15.0)
Lymphocytes Relative: 38.1 % (ref 12.0–46.0)
Lymphs Abs: 1.7 K/uL (ref 0.7–4.0)
MCHC: 33.9 g/dL (ref 30.0–36.0)
MCV: 93.7 fl (ref 78.0–100.0)
Monocytes Absolute: 0.4 K/uL (ref 0.1–1.0)
Monocytes Relative: 7.7 % (ref 3.0–12.0)
Neutro Abs: 2.4 K/uL (ref 1.4–7.7)
Neutrophils Relative %: 52.5 % (ref 43.0–77.0)
Platelets: 314 K/uL (ref 150.0–400.0)
RBC: 4.36 Mil/uL (ref 3.87–5.11)
RDW: 12.6 % (ref 11.5–15.5)
WBC: 4.6 K/uL (ref 4.0–10.5)

## 2024-08-23 LAB — COMPREHENSIVE METABOLIC PANEL WITH GFR
ALT: 14 U/L (ref 0–35)
AST: 17 U/L (ref 0–37)
Albumin: 4.8 g/dL (ref 3.5–5.2)
Alkaline Phosphatase: 52 U/L (ref 39–117)
BUN: 13 mg/dL (ref 6–23)
CO2: 29 meq/L (ref 19–32)
Calcium: 9.6 mg/dL (ref 8.4–10.5)
Chloride: 102 meq/L (ref 96–112)
Creatinine, Ser: 0.73 mg/dL (ref 0.40–1.20)
GFR: 92.72 mL/min (ref >60.00)
Glucose, Bld: 80 mg/dL (ref 70–99)
Potassium: 3.8 meq/L (ref 3.5–5.1)
Sodium: 140 meq/L (ref 135–145)
Total Bilirubin: 0.5 mg/dL (ref 0.2–1.2)
Total Protein: 7.2 g/dL (ref 6.0–8.3)

## 2024-08-23 MED ORDER — AMOXICILLIN-POT CLAVULANATE 875-125 MG PO TABS: 1.0000 | ORAL_TABLET | Freq: Two times a day (BID) | ORAL | 0 refills | Status: AC

## 2024-08-23 NOTE — Progress Notes (Signed)
 Acute Office Visit  Subjective:     Patient ID: Kaitlyn Kane, female    DOB: Jun 11, 1971, 53 y.o.   MRN: [id number]  Chief Complaint  Patient presents with   Acute Visit    Ear pressure, headaches & when having an orgasim her head gets an Excruciating pain like it is going to explode x 2-3 months Vertigo concerns     Discussed the use of AI scribe software for clinical note transcription with the patient, who gave verbal consent to proceed.  History of Present Illness [name] is a 52 year old female who presents with dizziness and headaches.  Headache - Severe headaches ongoing for a few months, initially triggered by orgasm - Headaches described as feeling like her head was going to explode - Headaches occur with every orgasm since the initial episode, leading to avoidance of orgasm - Morning headaches present, attributed to jaw clenching and dental issues  Dizziness and vertigo - Dizziness and 'swimmy' sensation in head since August - Dizziness occurs daily, persistent in the background - Symptoms exacerbated by sudden movements such as standing up or turning head - Severe episode during a walk, requiring support to walk and inability to walk independently for the rest of the night - No associated nausea or vomiting - History of severe dizziness with inner ear infection twenty years ago, but current symptoms are less extreme  Ear pressure and auditory symptoms - Sensation of pressure in right ear for the past couple of months - No hearing loss reported  Visual disturbance - Transient episode where part of the digital readout on her watch appeared missing, resolved quickly  Associated and constitutional symptoms - No nasal congestion, fever, or sore throat - No recent illness in herself or her family    Review of Systems  Constitutional: Negative.   HENT:  Negative for congestion, ear discharge, hearing loss, sinus pain, sore throat and  tinnitus.        Complains of right ear pressure  Eyes:  Positive for blurred vision.       Complaint 1 episode of blurred vision which quickly resolved  Respiratory: Negative.    Cardiovascular: Negative.   Gastrointestinal: Negative.   Musculoskeletal: Negative.   Neurological:  Positive for dizziness and headaches. Negative for focal weakness.  Psychiatric/Behavioral: Negative.          Objective:    BP 122/78   Pulse 67   Temp 98 F (36.7 C)   Ht 5' 9 (1.753 m)   Wt 148 lb 3.2 oz (67.2 kg)   SpO2 99%   BMI 21.89 kg/m    Physical Exam Constitutional:      Appearance: Normal appearance.  HENT:     Head: Normocephalic and atraumatic.     Right Ear: Ear canal and external ear normal. No tenderness. A middle ear effusion is present.     Left Ear: Ear canal and external ear normal. No tenderness. A middle ear effusion is present.     Ears:     Comments: Diminished light reflex bilateral TM worse on the right    Nose: Nose normal. No congestion or rhinorrhea.     Right Sinus: No maxillary sinus tenderness or frontal sinus tenderness.     Left Sinus: No maxillary sinus tenderness or frontal sinus tenderness.     Mouth/Throat:     Mouth: Mucous membranes are moist.     Pharynx: Oropharynx is clear. No oropharyngeal exudate or posterior oropharyngeal erythema.  Eyes:     Extraocular Movements: Extraocular movements intact.     Comments: No nystagmus noted  Cardiovascular:     Rate and Rhythm: Normal rate and regular rhythm.  Pulmonary:     Effort: Pulmonary effort is normal.     Breath sounds: Normal breath sounds. No wheezing, rhonchi or rales.  Abdominal:     General: Bowel sounds are normal. There is no distension.     Palpations: Abdomen is soft.     Tenderness: There is no abdominal tenderness.  Musculoskeletal:     Cervical back: Neck supple.  Lymphadenopathy:     Cervical: No cervical adenopathy.  Neurological:     Mental Status: She is alert.   Psychiatric:        Mood and Affect: Mood normal.     No results found for any visits on 08/23/24.      Assessment & Plan:   Problem List Items Addressed This Visit       Nervous and Auditory   Otitis media - Primary   - Patient  complains of right ear pressure for the last few months or hearing loss or tinnitus but no pain.  No ear discharge noted -On exam, patient does have diminished light reflex in both ears worse on the right possible middle ear effusion -Given the patient has had intermittent headaches, lightheadedness and ear pressure we will treat the patient for possible otitis media with Augmentin to complete a 7-day course -No further workup at this time      Relevant Medications   amoxicillin-clavulanate (AUGMENTIN) 875-125 MG tablet     Other   Episodic lightheadedness   - Patient complains of intermittent episodes of lightheadedness occurring daily for the last few months (since August) -Denies any syncopal episodes -No vertigo noted. -Lightheadedness worsens with changes in head movement and standing but can occur while sitting still -On exam, patient has no nystagmus.  Patient did complain of some lightheadedness with movement of her eyes -Etiology behind this remains uncertain at this time.  However, given her new onset episodic severe headaches (during orgasm) as well as intermittent episodes of lightheadedness will obtain an MRI/MRA to rule out underlying aneurysm or other underlying etiology for her symptoms. -If imaging is negative would consider referral to vestibular PT for possible BPPV even though she her symptoms do not classically fit this diagnosis -Patient also has a possible underlying otitis media and will treat this with antibiotics as well to see if this will improve her lightheadedness -No further workup at this time      Relevant Orders   CBC with Differential/Platelet   Comprehensive metabolic panel with GFR   MR ANGIO HEAD WO CONTRAST    Headache   - Patient complains of severe thunderclap like episodic headaches with orgasms over the last few months -Patient also complains of mild daily headaches which she attributes to  jaw clenching  - I am concerned for possible underlying aneurysm or mass.  Will obtain stat MRI/MRA for further evaluation - Given symptoms have been persistent since August I feel that it is appropriate to do evaluation in outpatient setting given that she has no symptoms currently       Relevant Orders   MR ANGIO HEAD WO CONTRAST   MR BRAIN WO CONTRAST    Meds ordered this encounter  Medications   amoxicillin-clavulanate (AUGMENTIN) 875-125 MG tablet    Sig: Take 1 tablet by mouth 2 (two) times daily.    Dispense:  14 tablet    Refill:  0    No follow-ups on file.  Rayelynn Loyal, MD

## 2024-08-23 NOTE — Patient Instructions (Addendum)
  VISIT SUMMARY: Today, you were seen for dizziness and headaches that have been troubling you for a few months. We discussed your symptoms in detail, including the nature and triggers of your headaches, as well as your dizziness and ear pressure.  YOUR PLAN: -OTITIS MEDIA WITH ASSOCIATED DIZZINESS: Otitis media is an infection or inflammation of the middle ear. You have been experiencing dizziness and pressure in your right ear, which may be due to this condition. We have prescribed antibiotics to treat the possible ear infection and ordered an MRI of your brain to rule out other causes of your dizziness and headaches. Basic blood work will also be done to check your overall health.  -HEADACHE SYNDROME: Your headaches, especially in the morning, may be related to jaw clenching and dental issues. To ensure there are no other underlying causes, we have ordered an MRI of your brain. This will help us  understand if there is any connection between your headaches and dizziness.  -GENERAL HEALTH MAINTENANCE: We discussed routine health maintenance and will conduct basic blood work to assess your overall health.  INSTRUCTIONS: Please take the prescribed antibiotics as directed. Schedule and complete the MRI of your brain as soon as possible. Additionally, ensure you complete the basic blood work. Follow up with us  after these tests are done to discuss the results and next steps.  Will consider possible referral to vestibular PT if dizziness continues if the MRI is normal and the dizziness persists despite antibiotics                      Contains text generated by [employer]bridge.                                 Contains text generated by [employer]bridge.

## 2024-08-23 NOTE — Assessment & Plan Note (Signed)
-   Patient complains of intermittent episodes of lightheadedness occurring daily for the last few months (since August) -Denies any syncopal episodes -No vertigo noted. -Lightheadedness worsens with changes in head movement and standing but can occur while sitting still -On exam, patient has no nystagmus.  Patient did complain of some lightheadedness with movement of her eyes -Etiology behind this remains uncertain at this time.  However, given her new onset episodic severe headaches (during orgasm) as well as intermittent episodes of lightheadedness will obtain an MRI/MRA to rule out underlying aneurysm or other underlying etiology for her symptoms. -If imaging is negative would consider referral to vestibular PT for possible BPPV even though she her symptoms do not classically fit this diagnosis -Patient also has a possible underlying otitis media and will treat this with antibiotics as well to see if this will improve her lightheadedness -No further workup at this time

## 2024-08-23 NOTE — Assessment & Plan Note (Addendum)
-   Patient complains of severe thunderclap like episodic headaches with orgasms over the last few months -Patient also complains of mild daily headaches which she attributes to  jaw clenching  - I am concerned for possible underlying aneurysm or mass.  Will obtain stat MRI/MRA for further evaluation - Given symptoms have been persistent since August I feel that it is appropriate to do evaluation in outpatient setting given that she has no symptoms currently

## 2024-08-23 NOTE — Assessment & Plan Note (Signed)
-   Patient  complains of right ear pressure for the last few months or hearing loss or tinnitus but no pain.  No ear discharge noted -On exam, patient does have diminished light reflex in both ears worse on the right possible middle ear effusion -Given the patient has had intermittent headaches, lightheadedness and ear pressure we will treat the patient for possible otitis media with Augmentin to complete a 7-day course -No further workup at this time

## 2024-08-24 ENCOUNTER — Ambulatory Visit: Payer: Self-pay | Admitting: Internal Medicine

## 2024-10-10 ENCOUNTER — Other Ambulatory Visit: Payer: Self-pay | Admitting: Nurse Practitioner

## 2024-10-10 DIAGNOSIS — Z1231 Encounter for screening mammogram for malignant neoplasm of breast: Secondary | ICD-10-CM

## 2024-11-08 ENCOUNTER — Encounter
